# Patient Record
Sex: Female | Born: 1945 | Race: White | Hispanic: No | Smoking: Former smoker
Health system: Southern US, Community
[De-identification: ages and names within clinical notes are randomized; demographics above are authoritative.]

## PROBLEM LIST (undated history)

## (undated) DIAGNOSIS — M810 Age-related osteoporosis without current pathological fracture: Secondary | ICD-10-CM

## (undated) DIAGNOSIS — J45909 Unspecified asthma, uncomplicated: Secondary | ICD-10-CM

## (undated) DIAGNOSIS — M8580 Other specified disorders of bone density and structure, unspecified site: Secondary | ICD-10-CM

## (undated) DIAGNOSIS — H353290 Exudative age-related macular degeneration, unspecified eye, stage unspecified: Secondary | ICD-10-CM

## (undated) DIAGNOSIS — S32000A Wedge compression fracture of unspecified lumbar vertebra, initial encounter for closed fracture: Secondary | ICD-10-CM

## (undated) DIAGNOSIS — G629 Polyneuropathy, unspecified: Secondary | ICD-10-CM

## (undated) DIAGNOSIS — E785 Hyperlipidemia, unspecified: Secondary | ICD-10-CM

## (undated) DIAGNOSIS — K219 Gastro-esophageal reflux disease without esophagitis: Secondary | ICD-10-CM

## (undated) HISTORY — DX: Age-related osteoporosis without current pathological fracture: M81.0

## (undated) HISTORY — DX: Gastro-esophageal reflux disease without esophagitis: K21.9

## (undated) HISTORY — DX: Exudative age-related macular degeneration, unspecified eye, stage unspecified: H35.3290

## (undated) HISTORY — PX: OTHER SURGICAL HISTORY: SHX169

## (undated) HISTORY — DX: Polyneuropathy, unspecified: G62.9

## (undated) HISTORY — DX: Unspecified asthma, uncomplicated: J45.909

## (undated) HISTORY — DX: Wedge compression fracture of unspecified lumbar vertebra, initial encounter for closed fracture: S32.000A

## (undated) HISTORY — DX: Hyperlipidemia, unspecified: E78.5

## (undated) HISTORY — DX: Other specified disorders of bone density and structure, unspecified site: M85.80

---
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
## 2001-03-01 HISTORY — PX: BLADDER SUSPENSION: SHX72

## 2013-03-01 HISTORY — PX: BREAST BIOPSY: SHX20

## 2015-08-12 LAB — HM DEXA SCAN

## 2017-09-29 LAB — CMP 10231: EGFR (Non-African Amer.): 80

## 2018-10-31 HISTORY — PX: KYPHOPLASTY: SHX5884

## 2019-03-08 HISTORY — PX: UPPER GI ENDOSCOPY: SHX6162

## 2019-09-17 LAB — BASIC METABOLIC PANEL
BUN: 23 — AB (ref 4–21)
CO2: 28 — AB (ref 13–22)
Chloride: 108 (ref 99–108)
Creatinine: 0.8 (ref <=1.1)
Glucose: 84
Potassium: 4.3 (ref 3.4–5.3)
Sodium: 140 (ref 137–147)

## 2019-09-17 LAB — CBC: RBC: 4.48 (ref 3.87–5.11)

## 2019-09-17 LAB — LIPID PANEL
Cholesterol: 175 (ref 0–200)
HDL: 51 (ref 35–70)
LDL Cholesterol: 104

## 2019-09-17 LAB — COMPREHENSIVE METABOLIC PANEL
Albumin: 4.1 (ref 3.5–5.0)
Calcium: 8.8 (ref 8.7–10.7)

## 2019-09-17 LAB — CBC AND DIFFERENTIAL
HCT: 40 (ref 36–46)
Hemoglobin: 13.2 (ref 12.0–16.0)
Platelets: 185 (ref 150–399)
WBC: 5.4

## 2019-11-21 LAB — HM MAMMOGRAPHY: HM Mammogram: ABNORMAL — AB (ref 0–4)

## 2019-11-30 ENCOUNTER — Ambulatory Visit: Payer: Medicare Other | Admitting: Family Medicine

## 2019-11-30 LAB — HM HEPATITIS C SCREENING LAB: HM Hepatitis Screen: NEGATIVE

## 2020-01-18 ENCOUNTER — Ambulatory Visit: Payer: Medicare Other | Admitting: Family Medicine

## 2020-01-20 ENCOUNTER — Encounter: Payer: Self-pay | Admitting: Family Medicine

## 2020-01-20 ENCOUNTER — Other Ambulatory Visit: Payer: Self-pay | Admitting: Family Medicine

## 2020-01-20 DIAGNOSIS — J45909 Unspecified asthma, uncomplicated: Secondary | ICD-10-CM | POA: Insufficient documentation

## 2020-01-20 DIAGNOSIS — G9009 Other idiopathic peripheral autonomic neuropathy: Secondary | ICD-10-CM | POA: Insufficient documentation

## 2020-01-20 DIAGNOSIS — K219 Gastro-esophageal reflux disease without esophagitis: Secondary | ICD-10-CM | POA: Insufficient documentation

## 2020-01-20 DIAGNOSIS — J449 Chronic obstructive pulmonary disease, unspecified: Secondary | ICD-10-CM | POA: Insufficient documentation

## 2020-01-20 DIAGNOSIS — M810 Age-related osteoporosis without current pathological fracture: Secondary | ICD-10-CM | POA: Insufficient documentation

## 2020-01-20 DIAGNOSIS — E559 Vitamin D deficiency, unspecified: Secondary | ICD-10-CM | POA: Insufficient documentation

## 2020-01-20 DIAGNOSIS — E785 Hyperlipidemia, unspecified: Secondary | ICD-10-CM | POA: Insufficient documentation

## 2020-01-20 DIAGNOSIS — D509 Iron deficiency anemia, unspecified: Secondary | ICD-10-CM | POA: Insufficient documentation

## 2020-01-20 DIAGNOSIS — H3530 Unspecified macular degeneration: Secondary | ICD-10-CM | POA: Insufficient documentation

## 2020-01-20 DIAGNOSIS — K769 Liver disease, unspecified: Secondary | ICD-10-CM | POA: Insufficient documentation

## 2020-01-20 MED ORDER — ATORVASTATIN CALCIUM 10 MG PO TABS: 10.0000 mg | ORAL_TABLET | Freq: Every day | ORAL | 3 refills | Status: DC

## 2020-01-20 MED ORDER — PANTOPRAZOLE SODIUM 40 MG PO TBEC: 40.0000 mg | DELAYED_RELEASE_TABLET | Freq: Every day | ORAL | 3 refills | Status: DC

## 2020-01-20 MED ORDER — GABAPENTIN 600 MG PO TABS: 600.0000 mg | ORAL_TABLET | Freq: Two times a day (BID) | ORAL | Status: DC

## 2020-01-20 MED ORDER — ALENDRONATE SODIUM 70 MG PO TABS: 70.0000 mg | ORAL_TABLET | ORAL | 11 refills | Status: DC

## 2020-01-23 ENCOUNTER — Encounter: Payer: Self-pay | Admitting: Family Medicine

## 2020-01-23 ENCOUNTER — Ambulatory Visit (INDEPENDENT_AMBULATORY_CARE_PROVIDER_SITE_OTHER): Payer: Medicare Other | Admitting: Family Medicine

## 2020-01-23 ENCOUNTER — Other Ambulatory Visit: Payer: Self-pay

## 2020-01-23 VITALS — BP 122/78 | HR 87 | Temp 97.9°F | Ht 68.0 in | Wt 221.0 lb

## 2020-01-23 DIAGNOSIS — H353230 Exudative age-related macular degeneration, bilateral, stage unspecified: Secondary | ICD-10-CM

## 2020-01-23 DIAGNOSIS — N6320 Unspecified lump in the left breast, unspecified quadrant: Secondary | ICD-10-CM

## 2020-01-23 DIAGNOSIS — J42 Unspecified chronic bronchitis: Secondary | ICD-10-CM | POA: Diagnosis not present

## 2020-01-23 DIAGNOSIS — M545 Low back pain, unspecified: Secondary | ICD-10-CM

## 2020-01-23 DIAGNOSIS — M79605 Pain in left leg: Secondary | ICD-10-CM

## 2020-01-23 DIAGNOSIS — D509 Iron deficiency anemia, unspecified: Secondary | ICD-10-CM

## 2020-01-23 DIAGNOSIS — M810 Age-related osteoporosis without current pathological fracture: Secondary | ICD-10-CM | POA: Diagnosis not present

## 2020-01-23 DIAGNOSIS — E559 Vitamin D deficiency, unspecified: Secondary | ICD-10-CM

## 2020-01-23 DIAGNOSIS — E785 Hyperlipidemia, unspecified: Secondary | ICD-10-CM

## 2020-01-23 DIAGNOSIS — G9009 Other idiopathic peripheral autonomic neuropathy: Secondary | ICD-10-CM

## 2020-01-23 DIAGNOSIS — G8929 Other chronic pain: Secondary | ICD-10-CM

## 2020-01-23 DIAGNOSIS — M79604 Pain in right leg: Secondary | ICD-10-CM

## 2020-01-23 NOTE — Progress Notes (Signed)
Jillian Guerrero DOB: 07-21-1945 Encounter date: 01/23/2020  This isa 74 y.o. female who presents to establish care. Chief Complaint  Patient presents with   Establish Care   Leg Pain    patient complains of bilateral leg pain and numbness, taking Gabapentin for peripheral neuropathy    History of present illness: Came here 12/09/19. Husband passed 2.5 years ago. Moved here to be closer to daughter that lives here. Daughter work from home. 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 She is living in extended stay now.   Has gained weight since husband's death and stress of move - up a lot from baseline.   Had wellness visit and saw PA who worked with PCP and did mammogram; wanted her to do DEXA but study can't be done before 01/25/20. She is on medication so wants to make sure that bone density is improving.   Has neuropathy in bilat lower extremities: states that PA told her related to back. Was put on gabapentin for this 600mg  TID; started that 12-15 years ago. Then during COVID was in bed and right foot turned inward and she couldn't move it. Didn't hurt, just couldn't move it. A couple of weeks later and same thing happened with left leg - just turned inward suddenly; couldn't move leg. Saw PCP and told her to see neurology; but her neurologist moved to . They did increase the gabapentin to BID and the turning in didn't happen again since then. During night sometimes would wake up with severe pain in legs - like someone cutting inside of legs; she was screaming. Tried to get up but passed out due to pain. 4 years ago; they were traveling when this happened; husband ended up callijng 911. Nothing was found (just told she was dehydrated). She is always worried that this is going to happen. If it happens she drinks water, walks up and down. States that it is not a cramp; pain is inside. She sometimes takes hot shower.   Had  kyphoplasty in Jan after fall. Does have pain in lower back. Has this pretty much every day. When severe takes aleve for pain, but tries to avoid taking medications. Sometimes pain does shoot down.   Follows with ophthalmology here q 4 weeks for wet macular degen bilat. Next visit is 12/8.   GERD: well controlled on the protonix 40mg  daily.   She quit smoking about 20+ years ago. Had asthma but has done well since quitting smoking. She is prone to getting bronchitis. Usually requires steroids and antibiotics to help get her over this.    Past Medical History:  Diagnosis Date   GERD (gastroesophageal reflux disease)    Hyperlipidemia    Lumbar compression fracture (HCC)    Osteoporosis    Peripheral neuropathy    Past Surgical History:  Procedure Laterality Date   APPENDECTOMY  1956   BLADDER SUSPENSION  2003   BREAST BIOPSY  2015   hyaluronic acid viscosupplementation Bilateral    knees - 01/16/2018 x2   KYPHOPLASTY  10/2018   TONSILLECTOMY  1964   UPPER GI ENDOSCOPY  03/08/2019   gastritis   Allergies  Allergen Reactions   Collagen     Redness, swelling   Current Meds  Medication Sig   alendronate (FOSAMAX) 70 MG tablet Take 1 tablet (70 mg total) by mouth every 7 (seven) days. Take with a full glass of water on an empty  stomach.   atorvastatin (LIPITOR) 10 MG tablet Take 1 tablet (10 mg total) by mouth daily.   gabapentin (NEURONTIN) 600 MG tablet Take 1 tablet (600 mg total) by mouth 2 (two) times daily.   pantoprazole (PROTONIX) 40 MG tablet Take 1 tablet (40 mg total) by mouth daily.   Social History   Tobacco Use   Smoking status: Former Smoker   Smokeless tobacco: Never Used  Substance Use Topics   Alcohol use: Never   Family History  Problem Relation Age of Onset   Arthritis Mother    Heart disease Mother    Arthritis Father    Depression Father    Arthritis Brother    High blood pressure Brother    High Cholesterol Brother     Arthritis Maternal Grandfather    Asthma Paternal Grandmother    Depression Paternal Grandmother    Heart disease Paternal Grandmother    High Cholesterol Paternal Grandmother    Arthritis Son    Heart attack Son    High Cholesterol Son    Arthritis Daughter      Review of Systems  Constitutional: Negative for chills, fatigue and fever.  Respiratory: Negative for cough, chest tightness, shortness of breath and wheezing.   Cardiovascular: Negative for chest pain, palpitations and leg swelling.  Musculoskeletal: Positive for back pain.  Neurological: Positive for numbness.    Objective:  BP 122/78 (BP Location: Left Arm, Patient Position: Sitting, Cuff Size: Large)    Pulse 87    Temp 97.9 F (36.6 C) (Oral)    Ht 5\' 8"  (1.727 m)    Wt 221 lb (100.2 kg)    BMI 33.60 kg/m   Weight: 221 lb (100.2 kg)   BP Readings from Last 3 Encounters:  01/23/20 122/78   Wt Readings from Last 3 Encounters:  01/23/20 221 lb (100.2 kg)    Physical Exam Constitutional:      General: She is not in acute distress.    Appearance: She is well-developed.  Cardiovascular:     Rate and Rhythm: Normal rate and regular rhythm.     Pulses:          Dorsalis pedis pulses are 2+ on the right side and 2+ on the left side.       Posterior tibial pulses are 2+ on the right side and 2+ on the left side.     Heart sounds: Normal heart sounds. No murmur heard.  No friction rub.     Comments: Warm, well perfused Pulmonary:     Effort: Pulmonary effort is normal. No respiratory distress.     Breath sounds: Normal breath sounds. No wheezing or rales.  Musculoskeletal:     Right lower leg: No edema.     Left lower leg: No edema.  Neurological:     Mental Status: She is alert and oriented to person, place, and time.     Deep Tendon Reflexes:     Reflex Scores:      Patellar reflexes are 2+ on the right side and 2+ on the left side.      Achilles reflexes are 2+ on the right side and 2+ on the  left side. Psychiatric:        Behavior: Behavior normal.     Assessment/Plan:  1. Left breast mass Patient given number for [facility]/location and [place]. She has CDs of prior mammograms as well and asked her to take to med records and give  to ROBIN so that we can proceed with biopsy orders/f/u eval. She will need diagnostic mamm right breast in 6 mo and L breast biopsy now.  2. Pain in both lower extremities I am going to look through prior records and will let her know what follow up indicated. Pain is severe when hits in legs; sounds nerve related. She has had nerve testing in past, and some spinal imaging; just not sure what.  3. Chronic bronchitis, unspecified chronic bronchitis type (HCC) Generally well controlled; does well with prednisone when she has flares.   4. Osteoporosis without current pathological fracture, unspecified osteoporosis type She is due for repeat; currently on fosamax. - DG Bone Density; Future  5. Idiopathic peripheral autonomic neuropathy See above. Gabapentin has helped with nerve pain. 600mg  BID.   6. Hyperlipidemia, unspecified hyperlipidemia type lipitor 10mg  daily; has been well controlled.  7. Iron deficiency anemia, unspecified iron deficiency anemia type Has been stable. Labs being scanned in.  8. Vitamin D deficiency Has been stable. See labs.   9. 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 Have asked her to have them fax note at next visit.  10. Chronic low back pain, unspecified back pain laterality, unspecified whether sciatica present See above.  Return for pending record review.  , MD

## 2020-01-31 ENCOUNTER — Other Ambulatory Visit: Payer: Self-pay | Admitting: *Deleted

## 2020-01-31 ENCOUNTER — Other Ambulatory Visit: Payer: Self-pay | Admitting: Family Medicine

## 2020-01-31 DIAGNOSIS — R5381 Other malaise: Secondary | ICD-10-CM

## 2020-02-04 ENCOUNTER — Telehealth: Payer: Self-pay | Admitting: Family Medicine

## 2020-02-04 DIAGNOSIS — R928 Other abnormal and inconclusive findings on diagnostic imaging of breast: Secondary | ICD-10-CM

## 2020-02-04 DIAGNOSIS — E2839 Other primary ovarian failure: Secondary | ICD-10-CM

## 2020-02-04 DIAGNOSIS — N6320 Unspecified lump in the left breast, unspecified quadrant: Secondary | ICD-10-CM

## 2020-02-04 NOTE — Telephone Encounter (Signed)
Pt is calling in stating that the facility is needing a order to have a biopsy on her L breast and a bone density.

## 2020-02-04 NOTE — Telephone Encounter (Signed)
Pt is calling in stating that she is awaiting return visit to see Dr. Hassan Rowan was told at her last visit that someone will call her to let her know when to schedule her next appointment after provider has looked over her previous medication from [place].  Pt is concerned about running out of her medication and she is not as to which ones she is going to be needing due to her just getting moved in to her new house from the hotel.  Pt stated that she will call back to let us know which ones she will need after unpacking her luggage's.

## 2020-02-04 NOTE — Telephone Encounter (Signed)
I do have a couple of things I was going to review through notes and get in touch; but she can have refill on any of her active meds on list if needed. Ok for 90 day supply.   At last visit we asked her to give those records to [facility]. So if they have them then I am ok with ordering the left breast biopsy now and the diagnostic right breast mammogram in 6 months (please order under dx of breast mass/abnormal mammogram). They needed to review her studies before being able to proceed with procedure.   Ok for dexa order with dx of estrogen deficiency

## 2020-02-06 MED ORDER — ATORVASTATIN CALCIUM 10 MG PO TABS
10.0000 mg | ORAL_TABLET | Freq: Every day | ORAL | 0 refills | Status: DC
Start: 2020-02-06 — End: 2020-04-21

## 2020-02-06 MED ORDER — ALENDRONATE SODIUM 70 MG PO TABS
70.0000 mg | ORAL_TABLET | ORAL | 0 refills | Status: DC
Start: 2020-02-06 — End: 2020-06-30

## 2020-02-06 MED ORDER — PANTOPRAZOLE SODIUM 40 MG PO TBEC
40.0000 mg | DELAYED_RELEASE_TABLET | Freq: Every day | ORAL | 0 refills | Status: DC
Start: 2020-02-06 — End: 2020-04-21

## 2020-02-06 MED ORDER — GABAPENTIN 600 MG PO TABS
600.0000 mg | ORAL_TABLET | Freq: Two times a day (BID) | ORAL | 0 refills | Status: DC
Start: 2020-02-06 — End: 2020-04-21

## 2020-02-06 NOTE — Telephone Encounter (Signed)
Patient informed of the message below.  Patient is aware the Rxs were sent to [facility], orders were entered and she is aware someone will call for appts as below.

## 2020-02-14 ENCOUNTER — Other Ambulatory Visit: Payer: Self-pay | Admitting: Internal Medicine

## 2020-02-14 ENCOUNTER — Other Ambulatory Visit: Payer: Self-pay | Admitting: Family Medicine

## 2020-02-14 DIAGNOSIS — R928 Other abnormal and inconclusive findings on diagnostic imaging of breast: Secondary | ICD-10-CM

## 2020-02-14 DIAGNOSIS — N6320 Unspecified lump in the left breast, unspecified quadrant: Secondary | ICD-10-CM

## 2020-02-25 ENCOUNTER — Ambulatory Visit
Admission: RE | Admit: 2020-02-25 | Discharge: 2020-02-25 | Disposition: A | Discharge status: Home / Self Care | Payer: Medicare Other | Source: Ambulatory Visit | Attending: Family Medicine | Admitting: Family Medicine

## 2020-02-25 ENCOUNTER — Other Ambulatory Visit: Payer: Self-pay

## 2020-02-25 DIAGNOSIS — N6320 Unspecified lump in the left breast, unspecified quadrant: Secondary | ICD-10-CM

## 2020-02-25 DIAGNOSIS — R928 Other abnormal and inconclusive findings on diagnostic imaging of breast: Secondary | ICD-10-CM

## 2020-02-25 IMAGING — MG MM BREAST LOCALIZATION CLIP
4 series · 4 of 12 positions shown · non-contrast
Comparison: Previous exam(s).

CLINICAL DATA: Confirmation of clip placement after stereotactic
tomosynthesis core needle biopsy a focal asymmetry involving the
OUTER LEFT breast at POSTERIOR depth identified on outside imaging.

EXAM:
2D AND TOMOSYNTHESIS DIAGNOSTIC LEFT MAMMOGRAM POST STEREOTACTIC
BIOPSY

[L CC synth-2D]
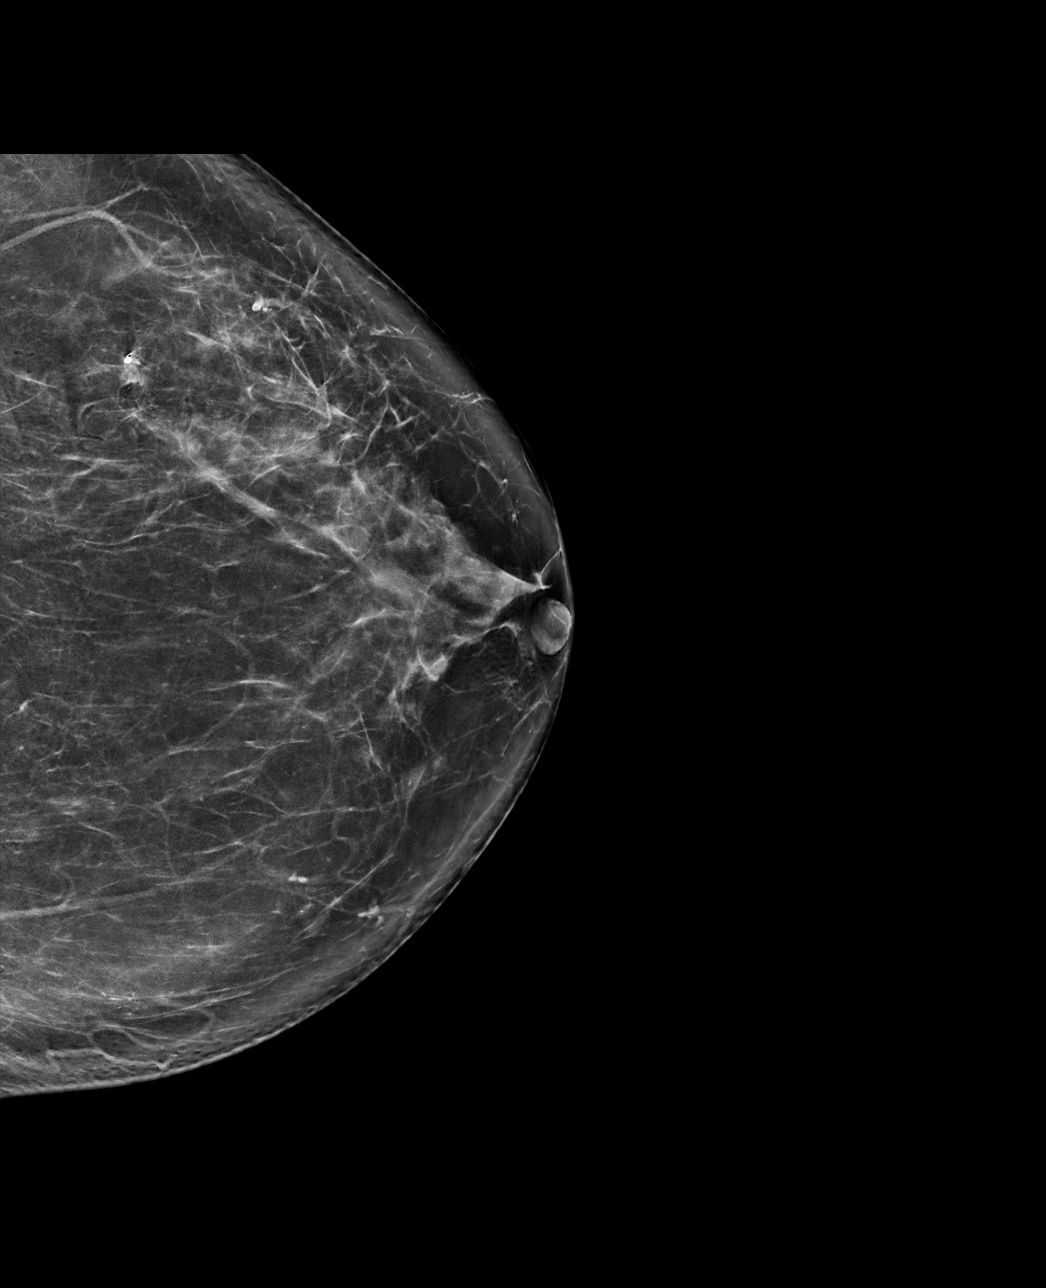

[L ML synth-2D]
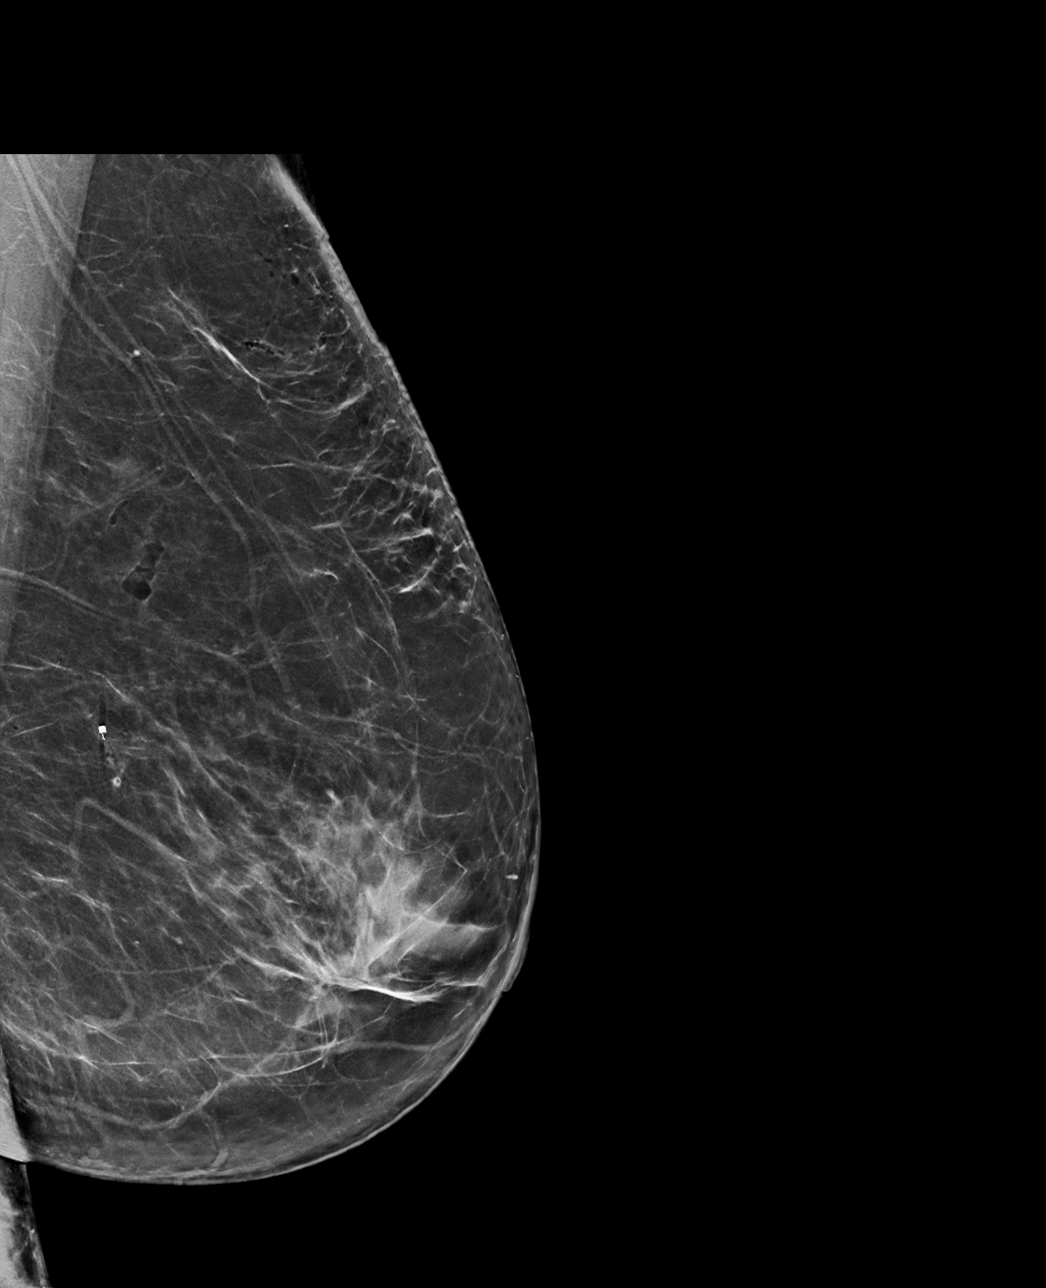

[L CC tomo · tomo slice 42/83.0]
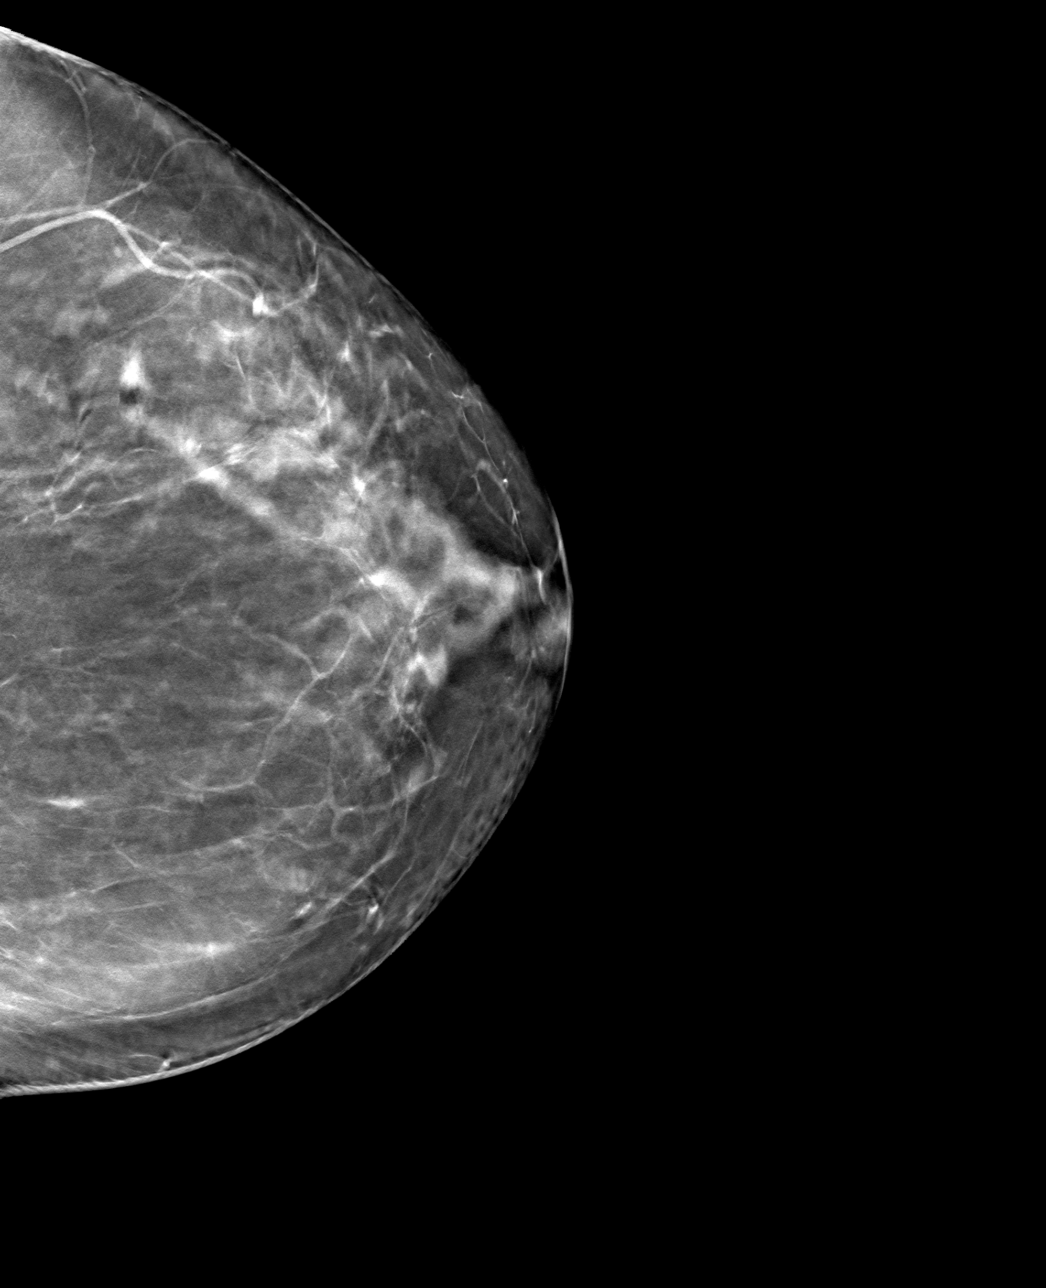

[L ML tomo · tomo slice 41/81.0]
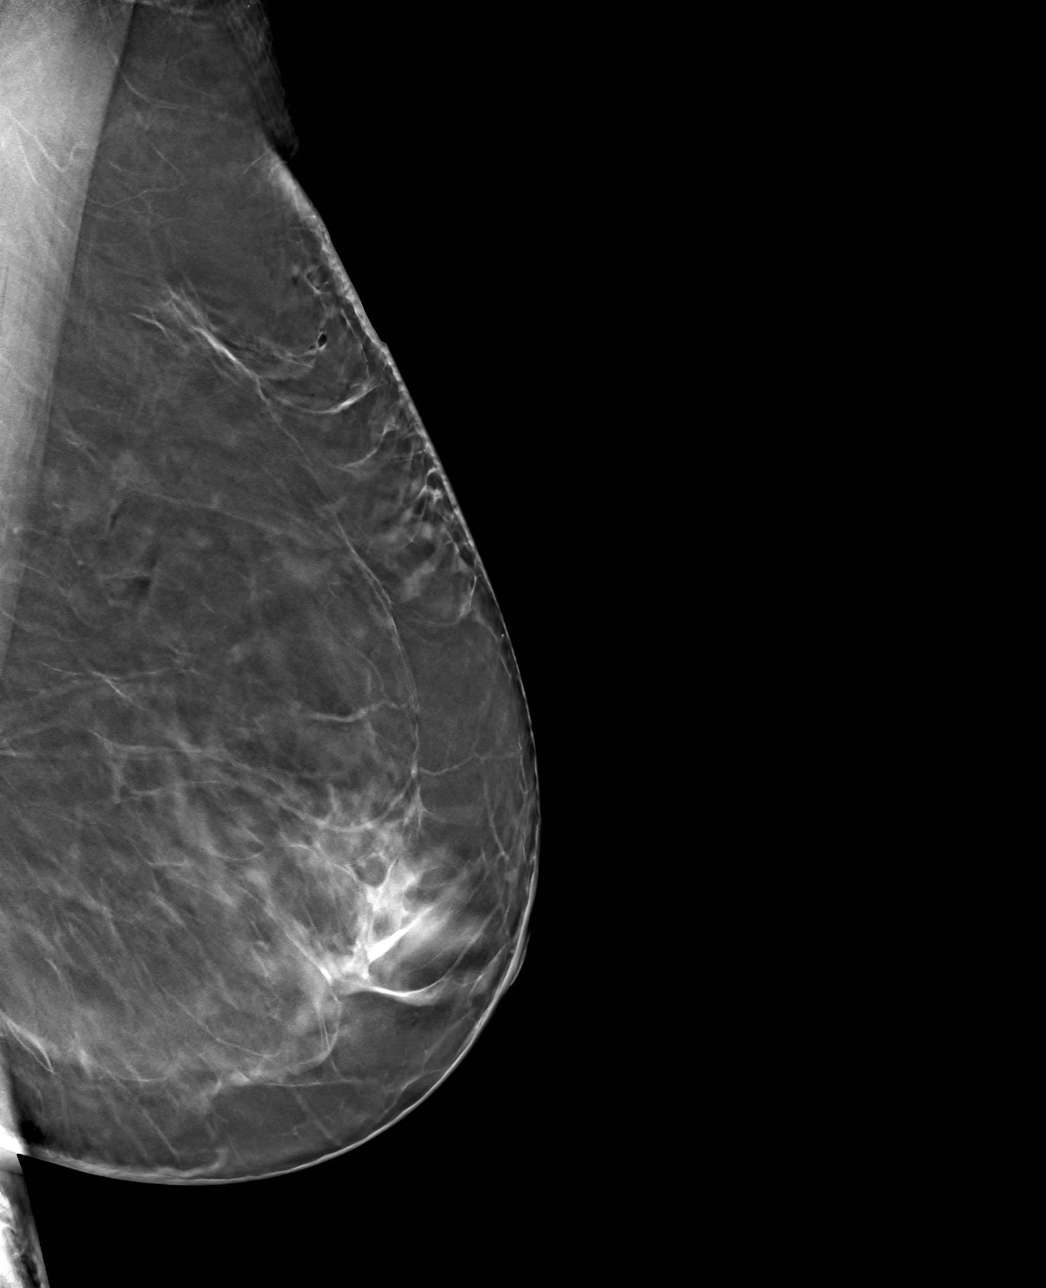

[4 of 12 positions shown; findings below may reference images not displayed]

FINDINGS: Tomosynthesis and synthesized full field CC and mediolateral images
were obtained following stereotactic tomosynthesis guided biopsy of
a focal asymmetry involving the OUTER LEFT breast at POSTERIOR
depth. The coil shaped tissue marking clip is appropriately
positioned at the site of the biopsied asymmetry in the OUTER breast
at POSTERIOR depth, slight UPPER OUTER QUADRANT.

Expected post biopsy changes are present without evidence of
hematoma.
IMPRESSION: Appropriate positioning of the coil shaped tissue marking clip at
the site of the biopsied focal asymmetry in the OUTER LEFT breast at
POSTERIOR depth, slight UPPER OUTER QUADRANT.

Final Assessment: Post Procedure Mammograms for Marker Placement

## 2020-02-25 IMAGING — MG MM BREAST BX W LOC DEV 1ST LESION IMAGE BX SPEC STEREO GUIDE*L*
8 of 9 series · 8 of 17 positions shown · non-contrast
Comparison: Previous exams from [REDACTED] in [facility].
COMPARISON: Previous exams from [REDACTED] in [facility].

Addendum:
CLINICAL DATA: 74-year-old presenting with a screening detected
focal asymmetry involving the OUTER LEFT breast at POSTERIOR depth
(identified on outside imaging).

EXAM:
LEFT BREAST STEREOTACTIC CORE NEEDLE BIOPSY

[L (1 of 6)]
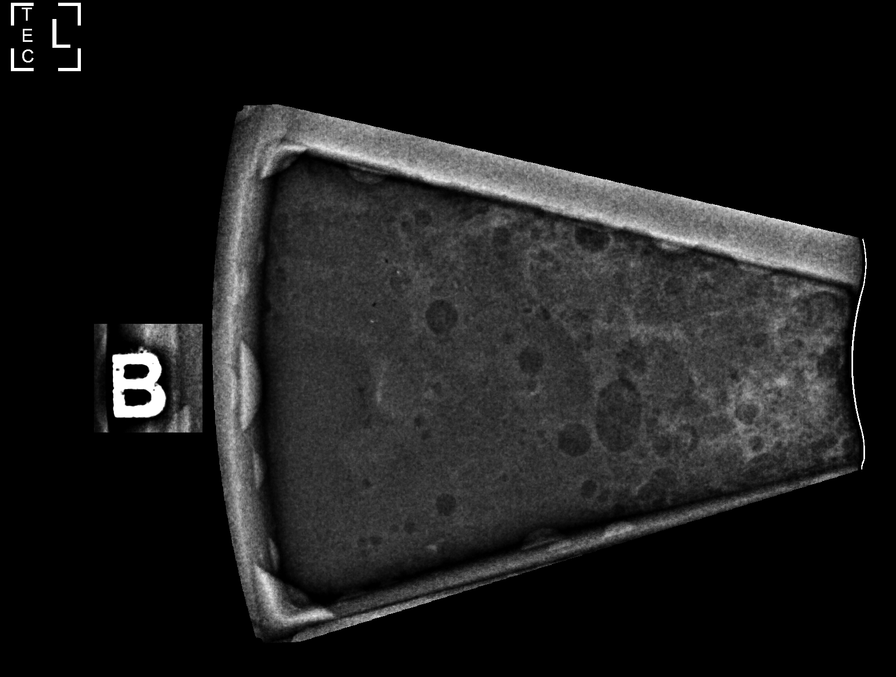

[L (2 of 6)]
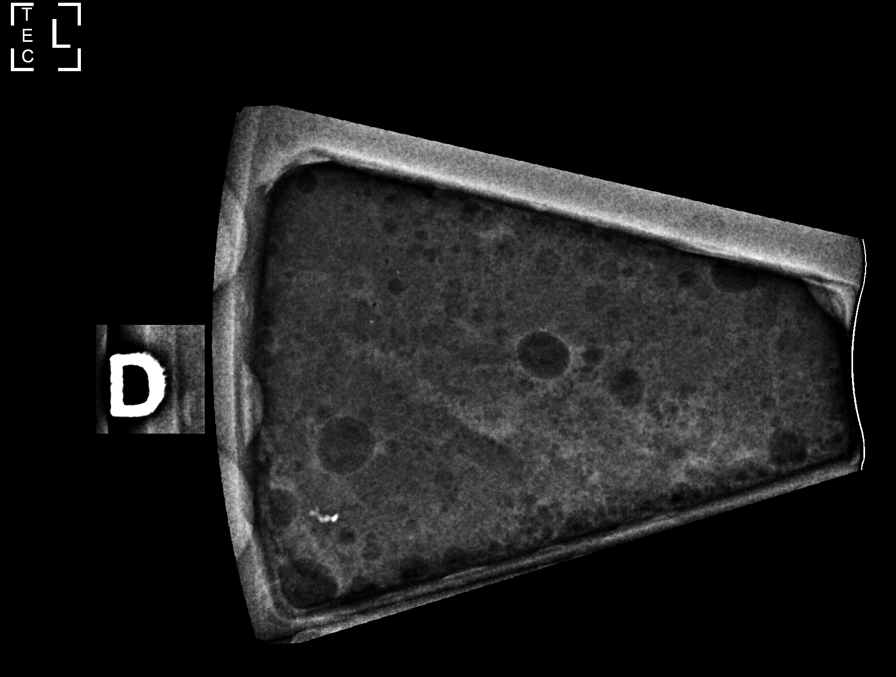

[L (3 of 6)]
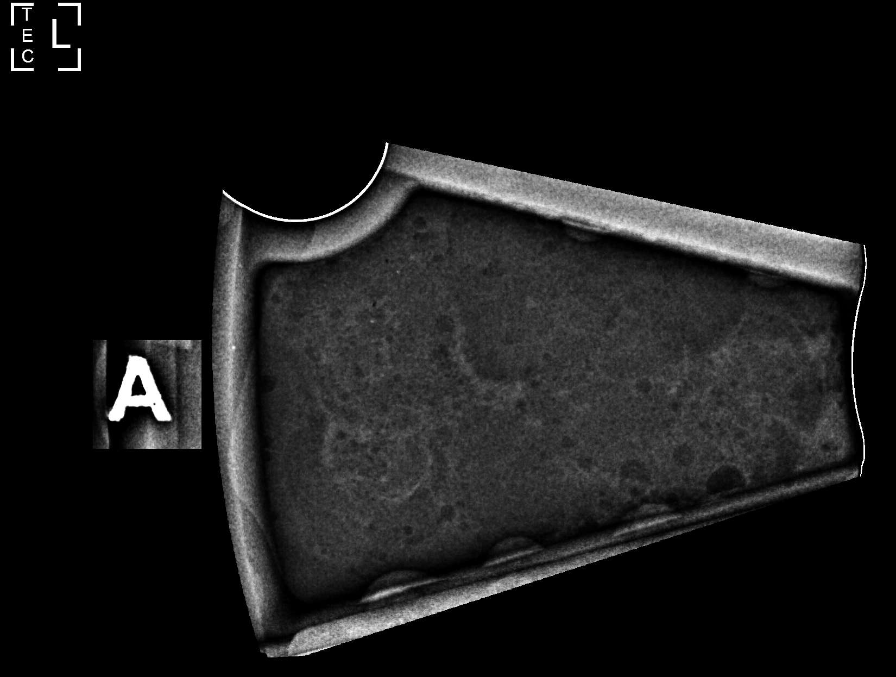

[L (4 of 6)]
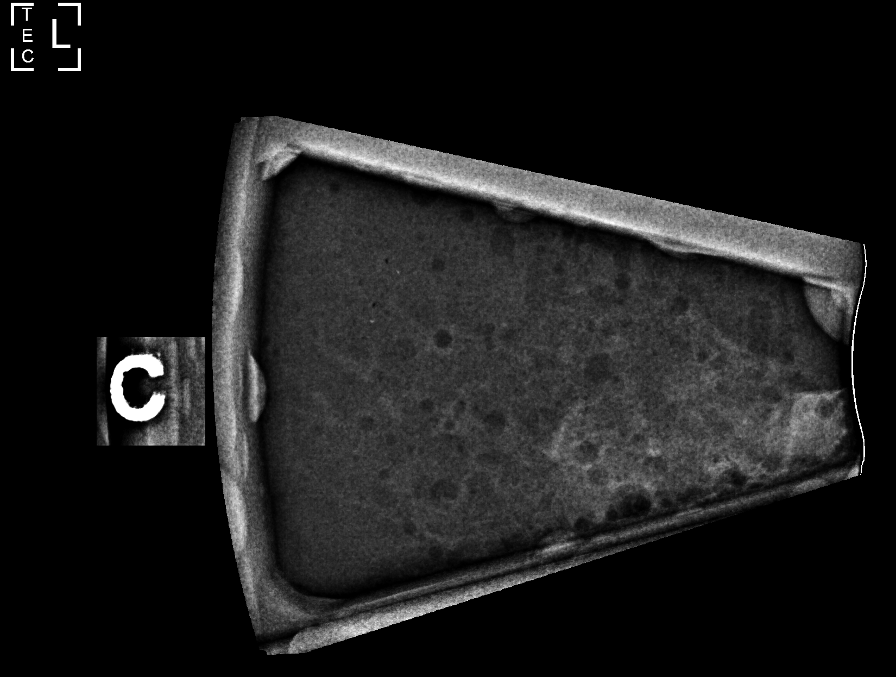

[L (5 of 6)]
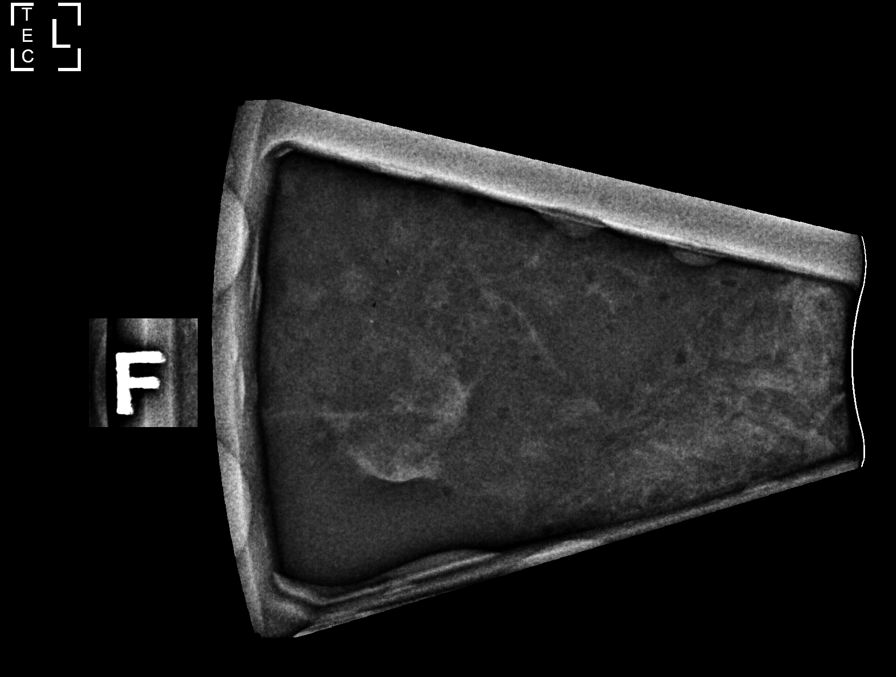

[L (6 of 6)]
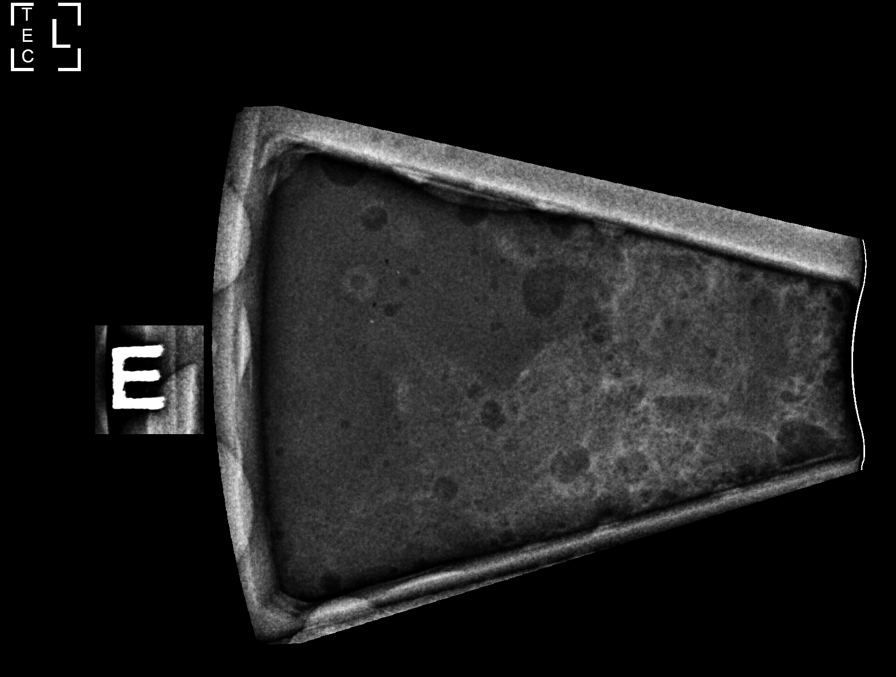

[L CC]
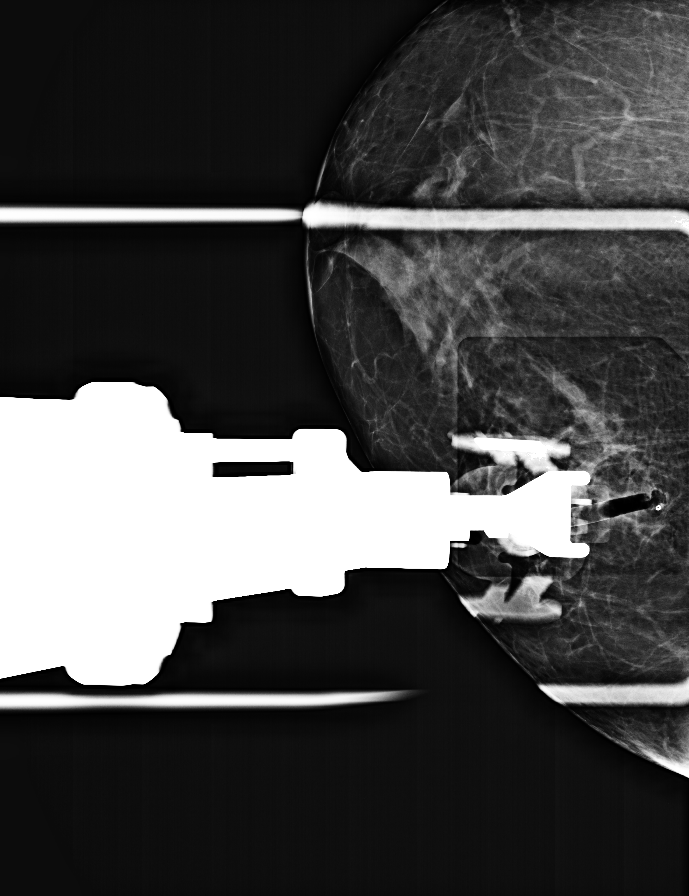

[L CC tomo · tomo slice 39/78.0]
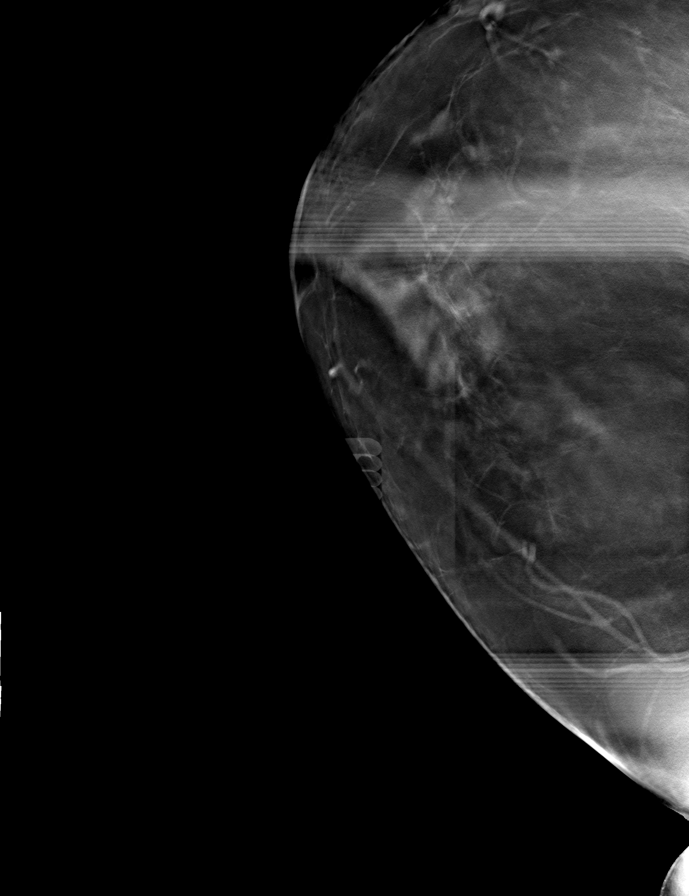

[8 of 17 positions shown; findings below may reference images not displayed]



Lesion quadrant: OUTER breast, slight UPPER OUTER QUADRANT.

Using sterile technique with chlorhexidine as skin antisepsis, 1%
lidocaine and 1% lidocaine with epinephrine as local anesthetic,
under stereotactic tomosynthesis guidance, a 9 gauge Brevera vacuum
assisted device was used to perform core needle biopsy of the focal
asymmetry in the OUTER LEFT breast using a superior approach.
Specimen radiograph was performed showing soft tissue density in
multiple core samples.

At the conclusion of the procedure, a coil shaped tissue marker clip
was deployed into the biopsy cavity. Follow-up 2-view mammogram was
performed and dictated separately.
IMPRESSION: Stereotactic-guided biopsy of a focal asymmetry involving the OUTER
LEFT breast at POSTERIOR depth, slight UPPER OUTER QUADRANT. No
apparent complications.

ADDENDUM:
Pathology revealed FIBROCYSTIC CHANGE WITH ADENOSIS of the LEFT
breast, outer at posterior depth. This was found to be concordant by
Dr. [facility].

Pathology results were discussed with the patient by telephone. The
patient reported doing well after the biopsy with minimal to no
tenderness at the site. Post biopsy instructions and care were
reviewed and questions were answered. The patient was encouraged to
call The [REDACTED] for any additional
concerns.

The patient was asked to return for RIGHT diagnostic mammography and
ultrasound in [REDACTED], early [DATE], per previous diagnostic
report from [REDACTED] in [REDACTED]. She was informed
a reminder notice would be sent regarding this appointment.

Pathology results reported by JOSIM UDDIN, RN on [DATE].



Lesion quadrant: OUTER breast, slight UPPER OUTER QUADRANT.

Using sterile technique with chlorhexidine as skin antisepsis, 1%
lidocaine and 1% lidocaine with epinephrine as local anesthetic,
under stereotactic tomosynthesis guidance, a 9 gauge Brevera vacuum
assisted device was used to perform core needle biopsy of the focal
asymmetry in the OUTER LEFT breast using a superior approach.
Specimen radiograph was performed showing soft tissue density in
multiple core samples.

At the conclusion of the procedure, a coil shaped tissue marker clip
was deployed into the biopsy cavity. Follow-up 2-view mammogram was
performed and dictated separately.
IMPRESSION: Stereotactic-guided biopsy of a focal asymmetry involving the OUTER
LEFT breast at POSTERIOR depth, slight UPPER OUTER QUADRANT. No
apparent complications.

## 2020-03-14 ENCOUNTER — Encounter: Payer: Self-pay | Admitting: Family Medicine

## 2020-03-14 ENCOUNTER — Telehealth (INDEPENDENT_AMBULATORY_CARE_PROVIDER_SITE_OTHER): Payer: Medicare Other | Admitting: Family Medicine

## 2020-03-14 VITALS — Wt 210.0 lb

## 2020-03-14 DIAGNOSIS — R29818 Other symptoms and signs involving the nervous system: Secondary | ICD-10-CM | POA: Diagnosis not present

## 2020-03-14 DIAGNOSIS — J4521 Mild intermittent asthma with (acute) exacerbation: Secondary | ICD-10-CM | POA: Diagnosis not present

## 2020-03-14 NOTE — Progress Notes (Signed)
Virtual Visit via Video Note  I connected with Jillian Guerrero on 03/14/20 at  4:00 PM EST by a video enabled telemedicine application and verified that I am speaking with the correct person using two identifiers.  Location patient: home Location provider: [facility]  [address] [place], [place] [address] Persons participating in the virtual visit: patient, provider  I discussed the limitations of evaluation and management by telemedicine and the availability of in person appointments. The patient expressed understanding and agreed to proceed.  Video was not working; so visit completed by telephone  Jillian Guerrero DOB: [birth year] Encounter date: 03/14/2020  This is a 75 y.o. female who presents with Chief Complaint  Patient presents with  . Cough    Patient complains of a non-productive cough and wheezing x1 month, tried OTC medication with no relief    History of present illness: Has been coughing now for a month. Not coughing now. States that she is prone to getting sick/sick exposure. States that she usually needs prednisone/abx to help her. Continues to wheeze after infections due to hx of asthma. Doesn't want to go to urgent care due to all the sick people out there.   She went to pharmacy and was given cough medication on xmas eve. Got flu shot that day as well. Wasn't doing great, but then new years eve, she wanted to go to urgent care but had hard time being seen. She was wheezing; hard to sleep.   Daughter was at her house and was worried. Called in middle of night and found [facility] urgent care virtual visit. Doc called and spoke with her and could hear wheezing on phone - prescribed: 21 pills of methylprednisone 4mg . Not wheezing much after finishing this. Also given inhaler to take 2 puffs q 6 hours. Also given doxycycline. And tessalon perles. She states that she is 100% better than last Saturday. She postponed cataract surgery (see below). Didn't have COVID test  during last month.   Last weds she had appointment with eye doc - has to get cataracts removed. Surgery scheduled for past Monday; but PA who was examining her said she could hear wheezing while examining her.   She has been taking gabapentin for peripheral neuropathy. 6 mo ago was in bed and left foot turned inward. Then couple of weeks later was sitting in chair and foot turned inward. No pain, nothing; just couldn't move it for 20-30 seconds. Happened a few times. Recommended increase in gabapentin and didn't happen again. What is happening now is: was walking and felt that foot was sticking to ground. Left foot/leg heavy like something stuck to shoe. Comes and goes; normal for a few days and then can recur.   Allergies  Allergen Reactions  . Collagen     Redness, swelling   Current Meds  Medication Sig  . alendronate (FOSAMAX) 70 MG tablet Take 1 tablet (70 mg total) by mouth every 7 (seven) days. Take with a full glass of water on an empty stomach.  Wednesday atorvastatin (LIPITOR) 10 MG tablet Take 1 tablet (10 mg total) by mouth daily.  Marland Kitchen gabapentin (NEURONTIN) 600 MG tablet Take 1 tablet (600 mg total) by mouth 2 (two) times daily.  . pantoprazole (PROTONIX) 40 MG tablet Take 1 tablet (40 mg total) by mouth daily.    Review of Systems  Constitutional: Negative for chills, fatigue and fever.  Respiratory: Negative for cough, chest tightness, shortness of breath and wheezing.   Cardiovascular:  Negative for chest pain, palpitations and leg swelling.  Neurological: Positive for weakness (see hpi). Negative for dizziness and headaches.    Objective:  Wt 210 lb (95.3 kg)   BMI 31.93 kg/m   Weight: 210 lb (95.3 kg)   BP Readings from Last 3 Encounters:  01/23/20 122/78   Wt Readings from Last 3 Encounters:  03/14/20 210 lb (95.3 kg)  01/23/20 221 lb (100.2 kg)    EXAM:  GENERAL: sounds alert, oriented, and in no acute distress  LUNGS: no noted difficulty with breathing during  conversation.   PSYCH/NEURO: pleasant and cooperative, no obvious depression or anxiety, speech and thought processing grossly intact   Assessment/Plan  1. Mild intermittent asthma with acute exacerbation She is doing much better since treatment from urgent care.  She would just like reassurance that somebody doing a lung exam on her.  I think with symptoms going on as long as they had, that this is reasonable.  I am going to make room in my schedule to see her on Wednesday.  2. Neurological deficit, transient I am concerned with her current episodes of abnormalities within her foot.  She completely reverts back to normal after episodes, but has had multiple episodes in the same leg.  I am going to order an MRI of the brain to start, we will further evaluate this week at her visit to see what other work-up may be needed.    I discussed the assessment and treatment plan with the patient. The patient was provided an opportunity to ask questions and all were answered. The patient agreed with the plan and demonstrated an understanding of the instructions.   The patient was advised to call back or seek an in-person evaluation if the symptoms worsen or if the condition fails to improve as anticipated.  I provided 25 minutes of non-face-to-face time during this encounter.   Theodis Shove, MD

## 2020-03-19 ENCOUNTER — Telehealth: Payer: Self-pay | Admitting: Family Medicine

## 2020-03-19 NOTE — Telephone Encounter (Signed)
Patient insists that she was suppose to have an appointment today with Dr. Fabian Sharp.  She states she was suppose to have an MRI done but she has not heard from anyone.  Please advise.

## 2020-03-20 ENCOUNTER — Telehealth: Payer: Self-pay | Admitting: *Deleted

## 2020-03-20 NOTE — Telephone Encounter (Signed)
-----   Message from Wynn Banker, MD sent at 03/14/2020  6:15 PM EST ----- Please add patient on to my schedule next weds at 12:00. She is aware of this appointment being scheduled.

## 2020-03-20 NOTE — Telephone Encounter (Signed)
Due to provider being out of the office, patients appt was already rescheduled for 1/24.

## 2020-03-20 NOTE — Telephone Encounter (Signed)
Appt was rescheduled for 1/24.  Spoke with the pt and informed her someone will call with appt info for the MRI as the order was recently placed and has to be approved through the insurance.

## 2020-03-24 ENCOUNTER — Ambulatory Visit (INDEPENDENT_AMBULATORY_CARE_PROVIDER_SITE_OTHER): Payer: Medicare Other

## 2020-03-24 ENCOUNTER — Ambulatory Visit (INDEPENDENT_AMBULATORY_CARE_PROVIDER_SITE_OTHER): Payer: Medicare Other | Admitting: Family Medicine

## 2020-03-24 ENCOUNTER — Other Ambulatory Visit: Payer: Self-pay

## 2020-03-24 ENCOUNTER — Encounter: Payer: Self-pay | Admitting: Family Medicine

## 2020-03-24 VITALS — BP 118/72 | HR 75 | Temp 96.8°F | Ht 68.0 in | Wt 215.0 lb

## 2020-03-24 DIAGNOSIS — E559 Vitamin D deficiency, unspecified: Secondary | ICD-10-CM | POA: Diagnosis not present

## 2020-03-24 DIAGNOSIS — R059 Cough, unspecified: Secondary | ICD-10-CM

## 2020-03-24 DIAGNOSIS — R29818 Other symptoms and signs involving the nervous system: Secondary | ICD-10-CM

## 2020-03-24 DIAGNOSIS — E538 Deficiency of other specified B group vitamins: Secondary | ICD-10-CM | POA: Diagnosis not present

## 2020-03-24 DIAGNOSIS — E785 Hyperlipidemia, unspecified: Secondary | ICD-10-CM | POA: Diagnosis not present

## 2020-03-24 LAB — LIPID PANEL
Cholesterol: 162 mg/dL (ref 0–200)
HDL: 51.6 mg/dL (ref >39.00)
LDL Cholesterol: 84 mg/dL (ref 0–99)
NonHDL: 110.04
Total CHOL/HDL Ratio: 3
Triglycerides: 131 mg/dL (ref 0.0–149.0)
VLDL: 26.2 mg/dL (ref 0.0–40.0)

## 2020-03-24 LAB — COMPREHENSIVE METABOLIC PANEL
ALT: 12 U/L (ref 0–35)
AST: 13 U/L (ref 0–37)
Albumin: 4.3 g/dL (ref 3.5–5.2)
Alkaline Phosphatase: 37 U/L — ABNORMAL LOW (ref 39–117)
BUN: 12 mg/dL (ref 6–23)
CO2: 31 mEq/L (ref 19–32)
Calcium: 9.4 mg/dL (ref 8.4–10.5)
Chloride: 105 mEq/L (ref 96–112)
Creatinine, Ser: 0.71 mg/dL (ref 0.40–1.20)
GFR: 83.24 mL/min (ref >60.00)
Glucose, Bld: 95 mg/dL (ref 70–99)
Potassium: 4.4 mEq/L (ref 3.5–5.1)
Sodium: 142 mEq/L (ref 135–145)
Total Bilirubin: 0.7 mg/dL (ref 0.2–1.2)
Total Protein: 6.3 g/dL (ref 6.0–8.3)

## 2020-03-24 LAB — TSH: TSH: 1.92 u[IU]/mL (ref 0.35–4.50)

## 2020-03-24 LAB — CBC WITH DIFFERENTIAL/PLATELET
Basophils Absolute: 0 10*3/uL (ref 0.0–0.1)
Basophils Relative: 0.9 % (ref 0.0–3.0)
Eosinophils Absolute: 0.3 10*3/uL (ref 0.0–0.7)
Eosinophils Relative: 5.8 % — ABNORMAL HIGH (ref 0.0–5.0)
HCT: 43.2 % (ref 36.0–46.0)
Hemoglobin: 14.5 g/dL (ref 12.0–15.0)
Lymphocytes Relative: 27.2 % (ref 12.0–46.0)
Lymphs Abs: 1.4 10*3/uL (ref 0.7–4.0)
MCHC: 33.7 g/dL (ref 30.0–36.0)
MCV: 87.3 fl (ref 78.0–100.0)
Monocytes Absolute: 0.4 10*3/uL (ref 0.1–1.0)
Monocytes Relative: 7.9 % (ref 3.0–12.0)
Neutro Abs: 3 10*3/uL (ref 1.4–7.7)
Neutrophils Relative %: 58.2 % (ref 43.0–77.0)
Platelets: 176 10*3/uL (ref 150.0–400.0)
RBC: 4.94 Mil/uL (ref 3.87–5.11)
RDW: 13.9 % (ref 11.5–15.5)
WBC: 5.1 10*3/uL (ref 4.0–10.5)

## 2020-03-24 LAB — VITAMIN D 25 HYDROXY (VIT D DEFICIENCY, FRACTURES): VITD: 22.89 ng/mL — ABNORMAL LOW (ref 30.00–100.00)

## 2020-03-24 LAB — VITAMIN B12: Vitamin B-12: 380 pg/mL (ref 211–911)

## 2020-03-24 IMAGING — DX DG CHEST 2V
2 series · 2 of 2 positions shown · non-contrast
Comparison: None.

CLINICAL DATA: Cough, shortness of breath

EXAM:
CHEST - 2 VIEW

[chest pa]
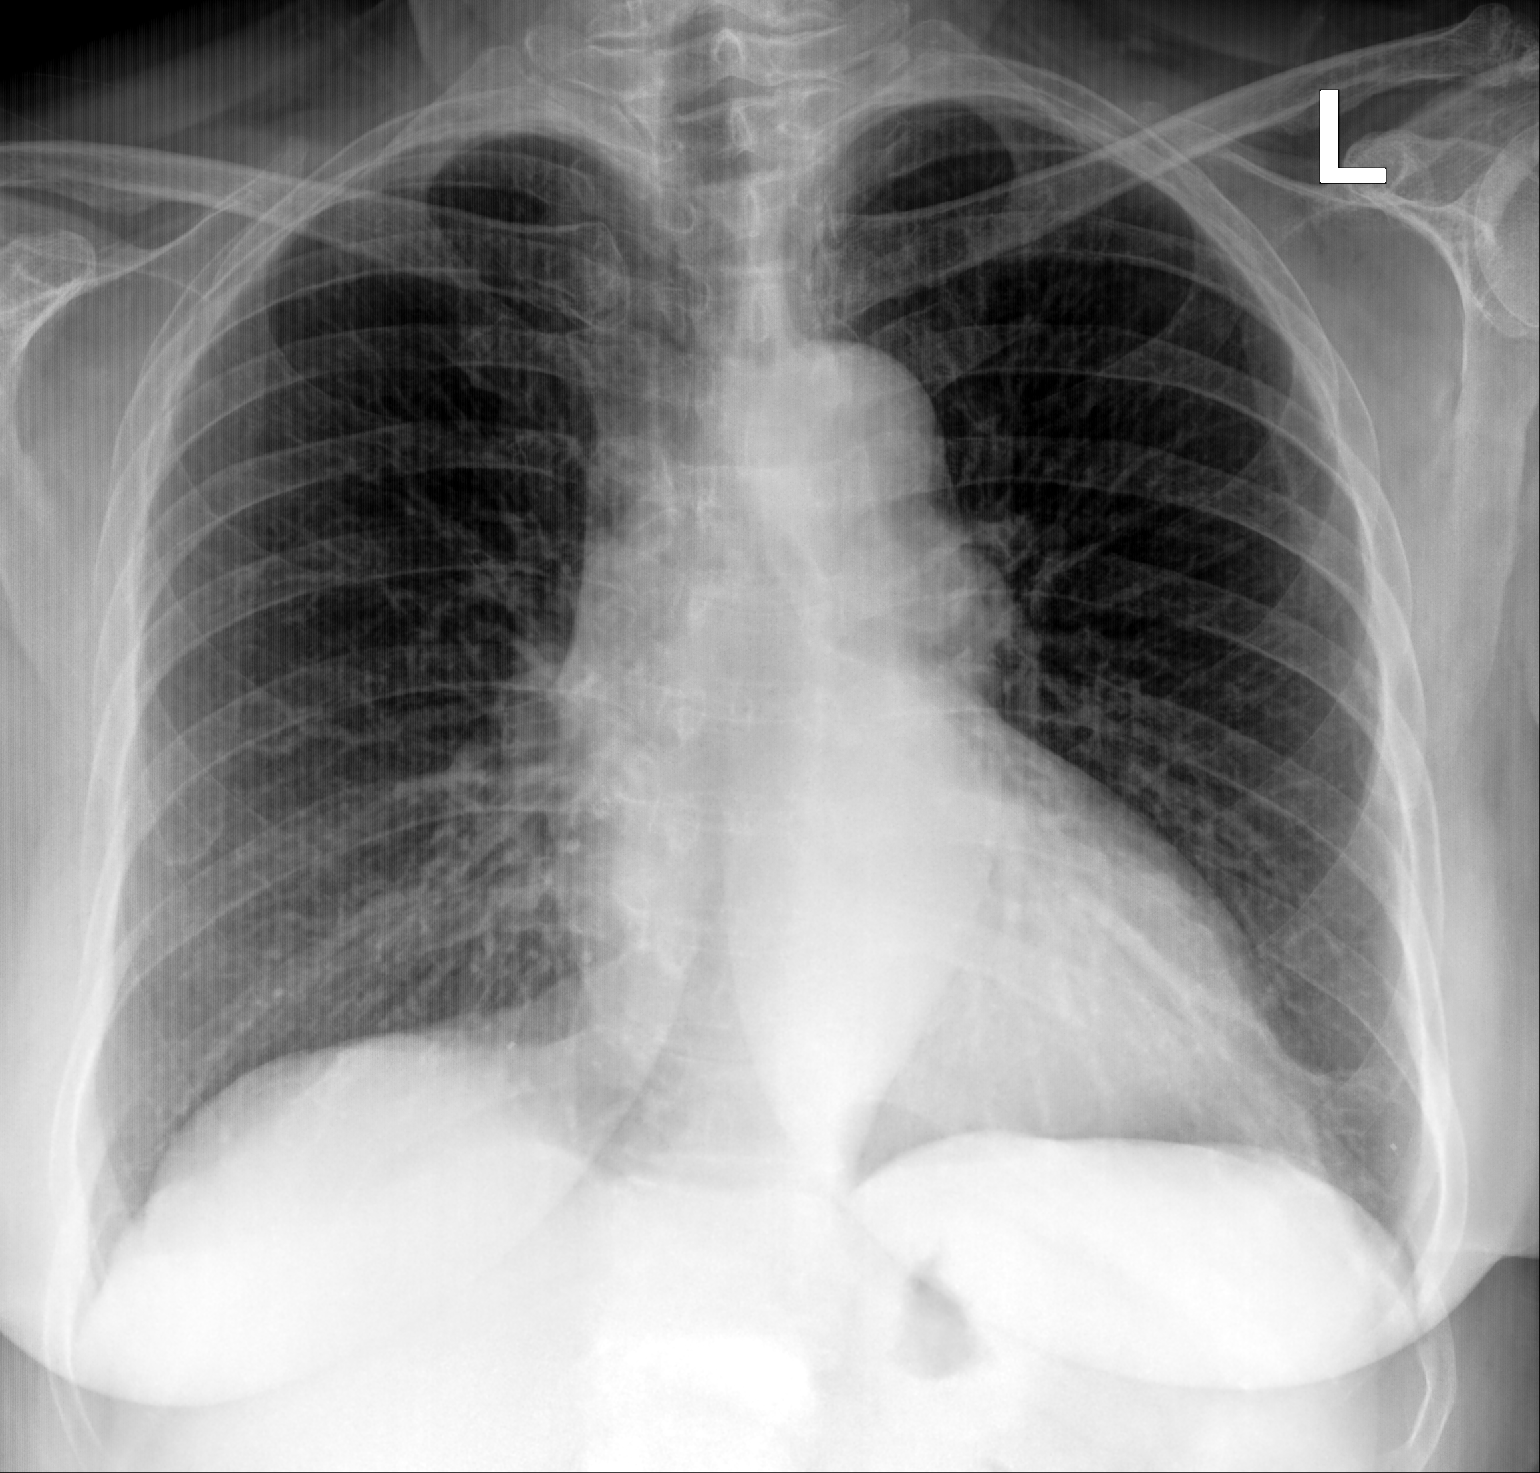

[chest lat]
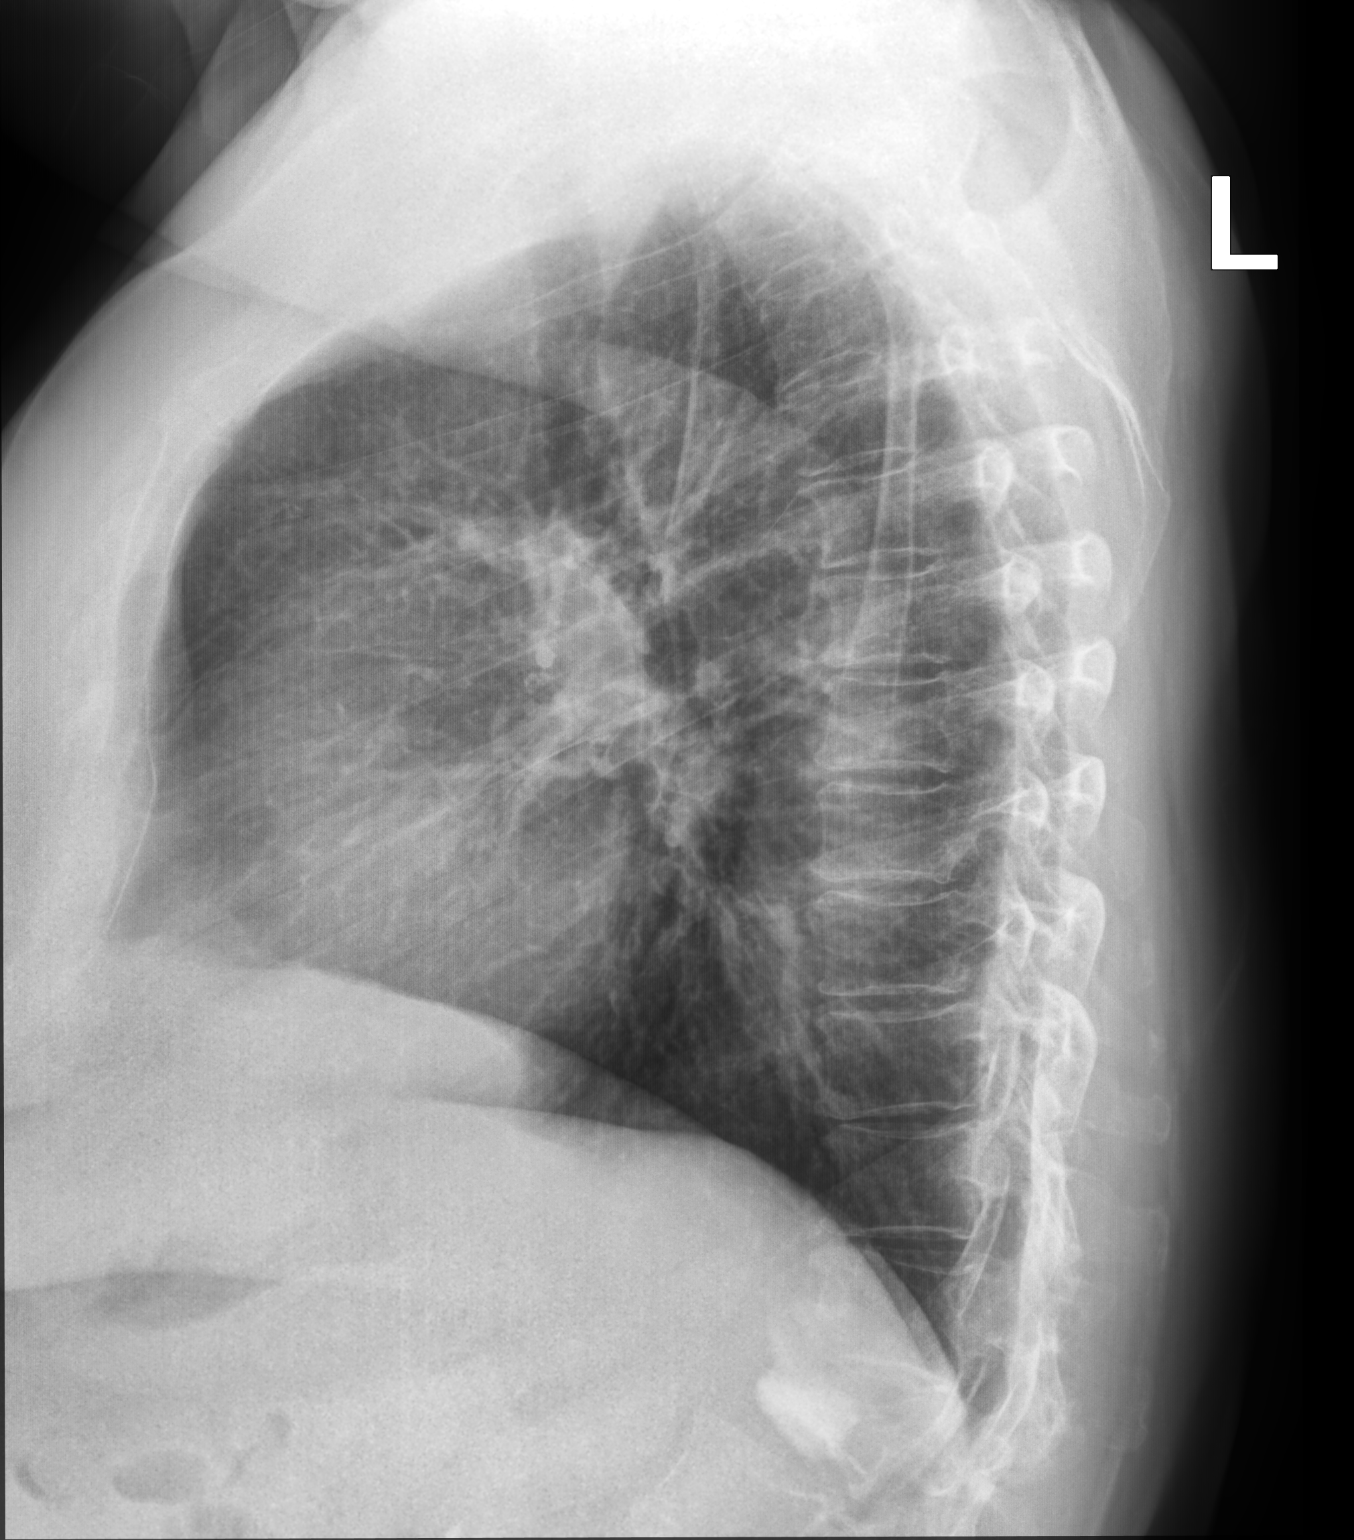

[2 of 2 positions shown; findings below may reference images not displayed]

FINDINGS: Heart and mediastinal contours are within normal limits. No focal
opacities or effusions. No acute bony abnormality.
IMPRESSION: No active cardiopulmonary disease.

## 2020-03-24 MED ORDER — FLUTICASONE-SALMETEROL 250-50 MCG/DOSE IN AEPB: 1.0000 | INHALATION_SPRAY | Freq: Two times a day (BID) | RESPIRATORY_TRACT | 2 refills | Status: DC

## 2020-03-24 MED ORDER — SPACER/AERO-HOLDING CHAMBERS DEVI: 1.0000 | Freq: Every day | 0 refills | Status: DC | PRN

## 2020-03-24 MED ORDER — ALBUTEROL SULFATE HFA 108 (90 BASE) MCG/ACT IN AERS: 2.0000 | INHALATION_SPRAY | Freq: Four times a day (QID) | RESPIRATORY_TRACT | 0 refills | Status: DC | PRN

## 2020-03-24 NOTE — Progress Notes (Signed)
Jillian Guerrero DOB: 12-20-1945 Encounter date: 03/24/2020  This is a 75 y.o. female who presents with Chief Complaint  Patient presents with  . Cough    Patient states the cough has resolved    History of present illness: We spoke by phone 2 weeks ago; she had been dealing with respiratory symptoms that were treated at urgent care.  She was feeling much better, but wanted lungs to be listened to to make sure everything sounded okay.  Additionally, she was having intermittent episodes of "leg not working".   Mess of phone calls with appointment today.   She started wheezing again - not all the time, but when she is walking up steps she can feel it. She is very worried that she is going to get sick again. She feels that she gets sick easily. She started coughing just a little bit, but not as bad as it was previously. Just more hacking cough. If she lays in bed she would start coughing/ but this is better now. Cough is not productive. No pain in chest, but has heaviness, especially with walking.   About a year ago; turning inward of left leg. Couldn't move it. No pain. Then 2 months later was sitting and same thing; left leg turned in, she couldn't move it; no pain. Just seconds. That was in August - saw primary physician and wanted her to see neurologist but appointment was too far away. Now then had two episodes within about 2 weeks of each other about 1-2 months ago where she had suddenly left leg felt stuck to ground to walk. Started having headaches lately; not as bad as she did in past. No other weakness, numbness, tingling in arms/legs. She is taking gabapentin 2 daily now; not sure if related to helping with turning inward. She does have back pain; lower back. Feels like cold water all the time. 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   Allergies  Allergen Reactions  . Collagen     Redness, swelling   Current Meds  Medication Sig  . alendronate (FOSAMAX) 70 MG  tablet Take 1 tablet (70 mg total) by mouth every 7 (seven) days. Take with a full glass of water on an empty stomach.  Marland Kitchen atorvastatin (LIPITOR) 10 MG tablet Take 1 tablet (10 mg total) by mouth daily.  Marland Kitchen CALCIUM PO Take by mouth daily.  . cetirizine (ZYRTEC) 10 MG tablet Take 10 mg by mouth daily.  Marland Kitchen gabapentin (NEURONTIN) 600 MG tablet Take 1 tablet (600 mg total) by mouth 2 (two) times daily.  . pantoprazole (PROTONIX) 40 MG tablet Take 1 tablet (40 mg total) by mouth daily.    Review of Systems  Constitutional: Negative for chills, fatigue and fever.  Respiratory: Positive for cough, shortness of breath and wheezing. Negative for chest tightness.   Cardiovascular: Negative for chest pain, palpitations and leg swelling.  Neurological: Positive for weakness (see hpi).    Objective:  BP 118/72 (BP Location: Left Arm, Patient Position: Sitting, Cuff Size: Large)   Pulse 75   Temp (!) 96.8 F (36 C) (Axillary)   Ht _0  (1.727 m)   Wt 215 lb (97.5 kg)   SpO2 95%   BMI 32.69 kg/m   Weight: 215 lb (97.5 kg)   BP Readings from Last 3 Encounters:  03/24/20 118/72  01/23/20 122/78   Wt Readings from Last 3 Encounters:  03/24/20 215 lb (97.5 kg)  03/14/20 210 lb (95.3 kg)  01/23/20  221 lb (100.2 kg)    Physical Exam Constitutional:      General: She is not in acute distress.    Appearance: She is well-developed and well-nourished. She is not diaphoretic.  HENT:     Head: Normocephalic and atraumatic.     Right Ear: External ear normal.     Left Ear: External ear normal.     Mouth/Throat:     Mouth: Oropharynx is clear and moist.  Eyes:     Conjunctiva/sclera: Conjunctivae normal.     Pupils: Pupils are equal, round, and reactive to light.  Neck:     Thyroid: No thyromegaly.  Cardiovascular:     Rate and Rhythm: Normal rate and regular rhythm.     Heart sounds: Normal heart sounds. No murmur heard. No friction rub. No gallop.      Comments: No cardiac bruit  appreciated Pulmonary:     Effort: Pulmonary effort is normal. No respiratory distress.     Breath sounds: Decreased air movement present. Decreased breath sounds and wheezing (end exp) present. No rales.  Musculoskeletal:     Cervical back: Neck supple.     Right lower leg: No edema.     Left lower leg: No edema.  Lymphadenopathy:     Cervical: No cervical adenopathy.  Skin:    General: Skin is warm and dry.  Neurological:     Mental Status: She is alert and oriented to person, place, and time.     Cranial Nerves: No cranial nerve deficit.     Motor: No abnormal muscle tone.     Deep Tendon Reflexes: Strength normal. Reflexes normal.     Reflex Scores:      Tricep reflexes are 2+ on the right side and 2+ on the left side.      Bicep reflexes are 2+ on the right side and 2+ on the left side.      Brachioradialis reflexes are 2+ on the right side and 2+ on the left side.      Patellar reflexes are 2+ on the right side and 2+ on the left side. Psychiatric:        Mood and Affect: Mood and affect normal.        Behavior: Behavior normal.     Assessment/Plan  1. Cough Will get cxr for follow up since still having some cough. Suspect post infectious/reactive airway and should improve with advair inhaler which was sent in today. She has used this in remote past and did well with this. - DG Chest 2 View; Future  2. Neurological deficit, transient She would like to see neurology which I think is reasonable given symptoms of leg weakness. We will order imaging in meanwhile of brain to make sure no abnormalities noted. Neuro exam for me today is normal. - Ambulatory referral to Neurology - CBC with Differential/Platelet; Future - CBC with Differential/Platelet  3. Vitamin B12 deficiency - Vitamin B12; Future - Vitamin B12  4. Vitamin D deficiency - VITAMIN D 25 Hydroxy (Vit-D Deficiency, Fractures); Future - VITAMIN D 25 Hydroxy (Vit-D Deficiency, Fractures)  5. Hyperlipidemia,  unspecified hyperlipidemia type - TSH; Future - Comprehensive metabolic panel; Future - Lipid panel; Future - TSH - Comprehensive metabolic panel - Lipid panel   Return for pending MRI results.    Micheline Rough, MD

## 2020-04-01 ENCOUNTER — Encounter: Payer: Self-pay | Admitting: Neurology

## 2020-04-14 ENCOUNTER — Ambulatory Visit
Admission: RE | Admit: 2020-04-14 | Discharge: 2020-04-14 | Disposition: A | Discharge status: Home / Self Care | Payer: Medicare Other | Source: Ambulatory Visit | Attending: Family Medicine | Admitting: Family Medicine

## 2020-04-14 ENCOUNTER — Other Ambulatory Visit: Payer: Self-pay

## 2020-04-14 DIAGNOSIS — R29818 Other symptoms and signs involving the nervous system: Secondary | ICD-10-CM

## 2020-04-14 IMAGING — MR MR HEAD W/O CM
11 series · 48 of 48 positions shown · non-contrast
Comparison: None.

CLINICAL DATA: Intermittent loss of motor function.

EXAM:
MRI HEAD WITHOUT CONTRAST
TECHNIQUE: Multiplanar, multiecho pulse sequences of the brain and surrounding
structures were obtained without intravenous contrast.

[Series 2: t1_se_sag · sagittal · 5.0mm · 0.45mm/px · 3 of 26 slices shown (1 of 2)]
[im 1/26]
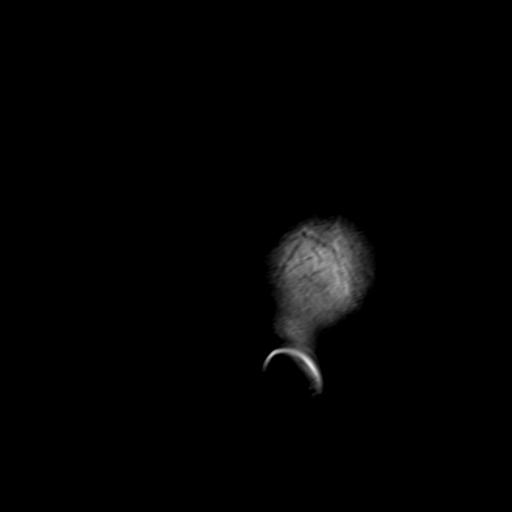
[im 13/26]
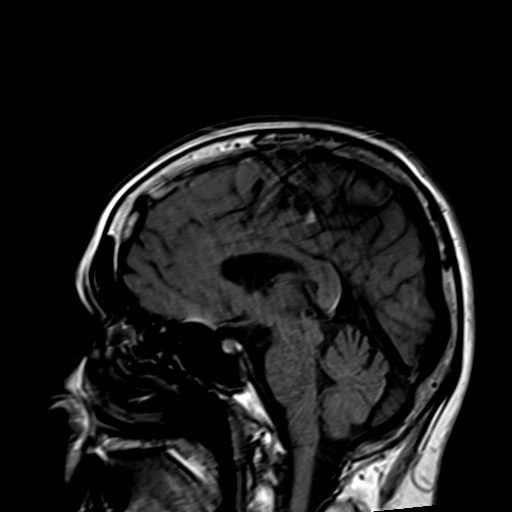
[im 26/26]
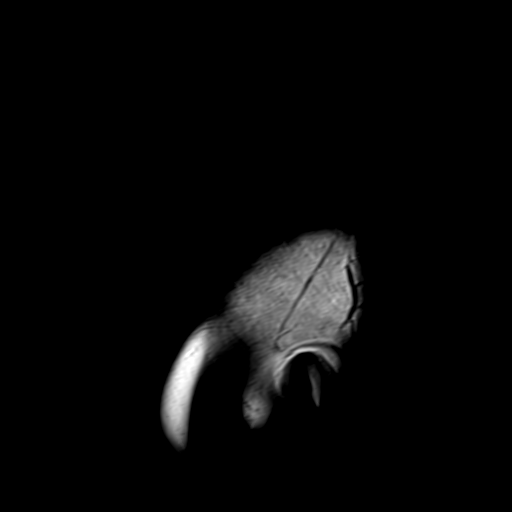

[Series 3: ep2d_diff_3 · axial · 3.0mm · 1.80mm/px · z∈[-57,+88]mm · 8 of 101 slices shown]
[im 1/101]
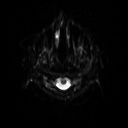
[im 15/101]
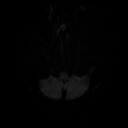
[im 29/101]
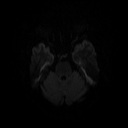
[im 43/101]
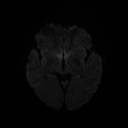
[im 58/101]
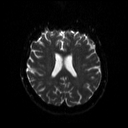
[im 72/101]
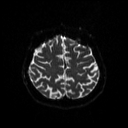
[im 86/101]
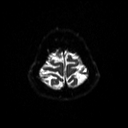
[im 101/101]
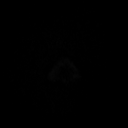

[Series 4: ep2d_diff_3_adc · axial · 3.0mm · 1.80mm/px · z∈[-57,+88]mm · 4 of 50 slices shown]
[im 1/50]
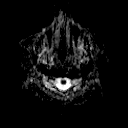
[im 17/50]
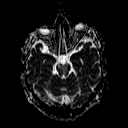
[im 33/50]
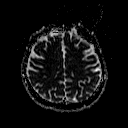
[im 50/50]
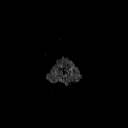

[Series 5: ep2d_diff_cor · coronal · 5.0mm · 1.77mm/px · 4 of 55 slices shown]
[im 1/55]
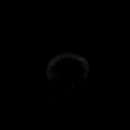
[im 19/55]
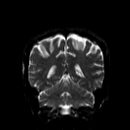
[im 37/55]
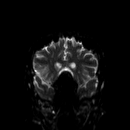
[im 55/55]
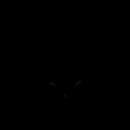

[Series 6: ep2d_diff_cor_adc · coronal · 5.0mm · 1.77mm/px · 2 of 28 slices shown]
[im 1/28]
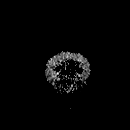
[im 28/28]
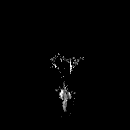

[Series 8: swi_images · axial · 2.0mm · 0.90mm/px · z∈[-63,+90]mm · 6 of 80 slices shown]
[im 1/80]
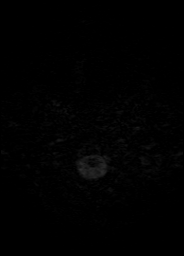
[im 16/80]
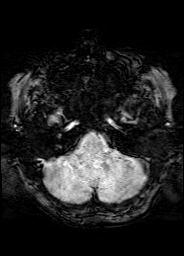
[im 32/80]
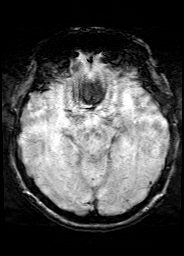
[im 48/80]
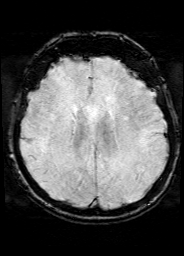
[im 64/80]
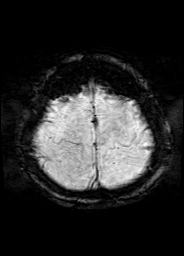
[im 80/80]
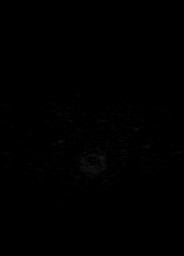

[Series 9: FLAIR · axial · 3.0mm · 0.43mm/px · z∈[-63,+88]mm · 2 of 27 slices shown]
[im 1/27]
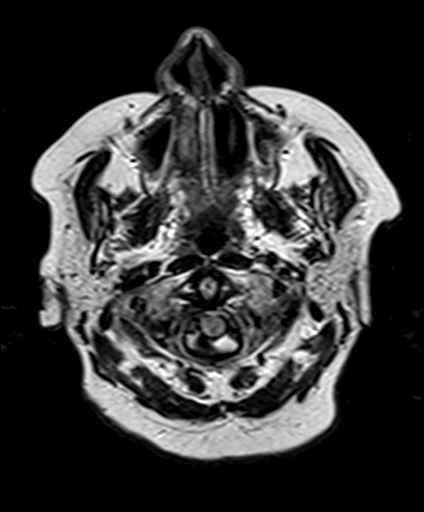
[im 27/27]
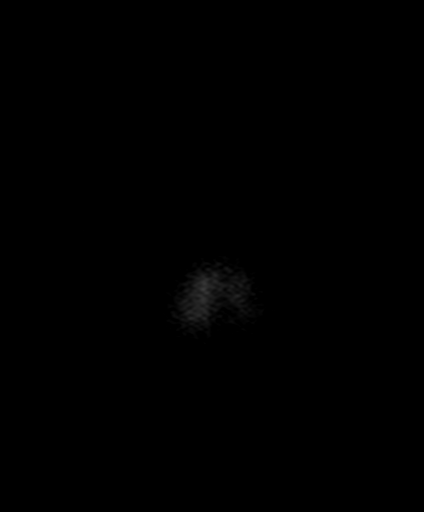

[Series 10: t2_tse_tra_512 · axial · 5.0mm · 0.60mm/px · z∈[-61,+89]mm · 2 of 27 slices shown]
[im 1/27]
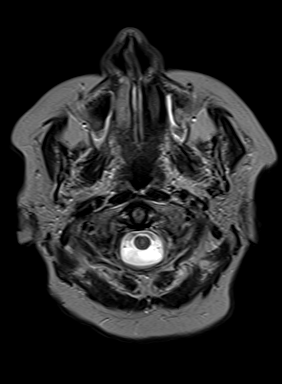
[im 27/27]
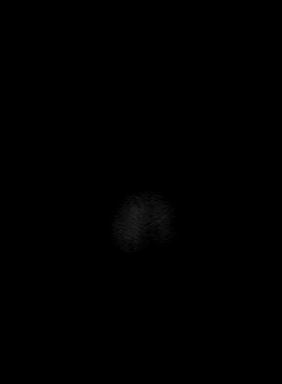

[Series 11: t1_mpr_tra · axial · 1.0mm · 0.72mm/px · z∈[-60,+93]mm · 13 of 160 slices shown]
[im 1/160]
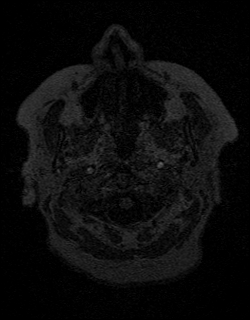
[im 14/160]
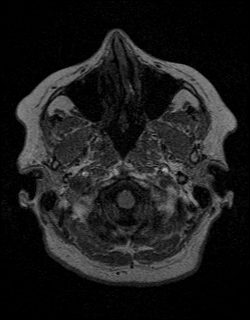
[im 27/160]
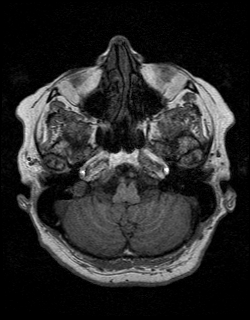
[im 40/160]
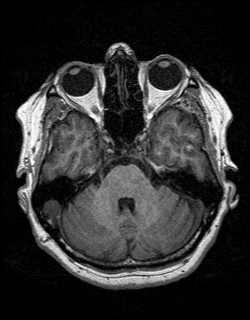
[im 54/160]
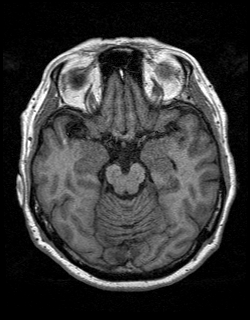
[im 67/160]
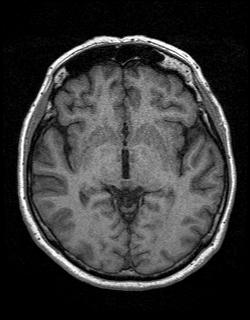
[im 80/160]
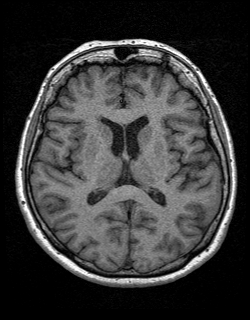
[im 93/160]
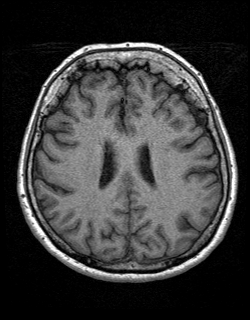
[im 107/160]
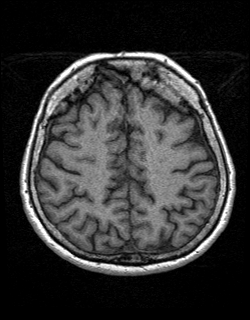
[im 120/160]
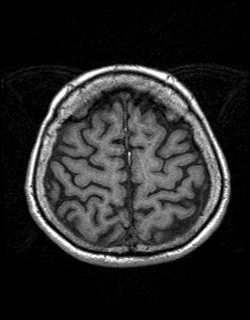
[im 133/160]
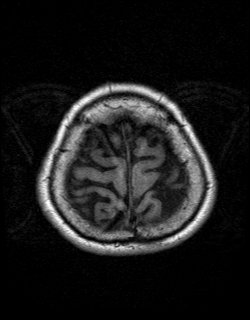
[im 146/160]
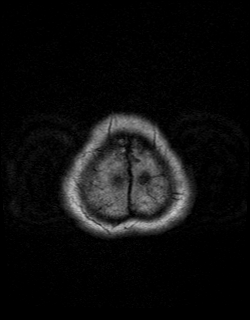
[im 160/160]
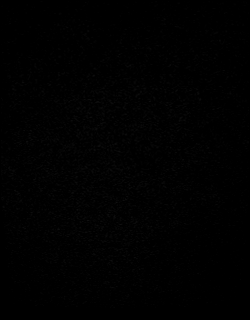

[Series 12: T2 · coronal · 5.0mm · 0.45mm/px · 2 of 30 slices shown]
[im 1/30]
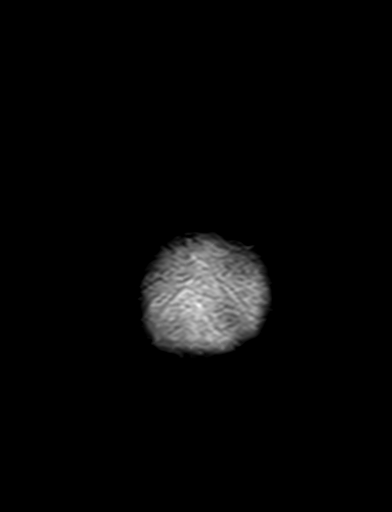
[im 30/30]
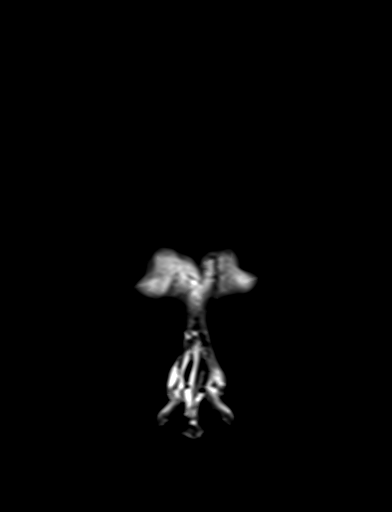

[Series 13: t1_se_sag · sagittal · 5.0mm · 0.45mm/px · 2 of 26 slices shown (2 of 2)]
[im 1/26]
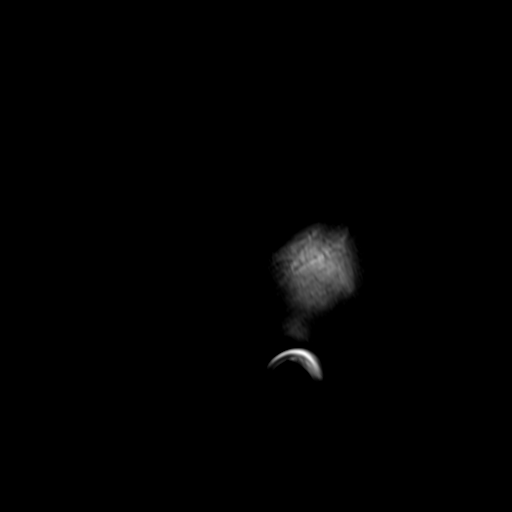
[im 26/26]
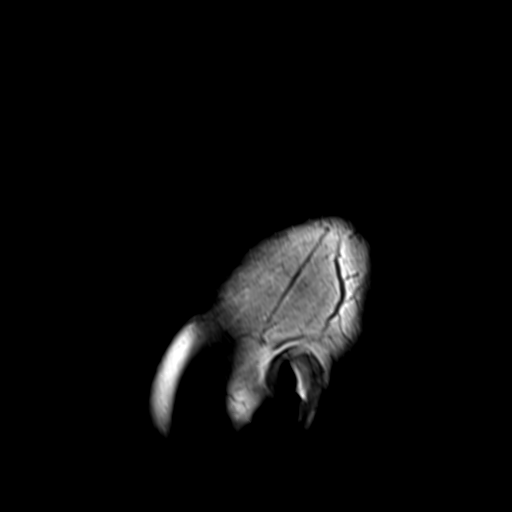

[48 of 48 positions shown; findings below may reference images not displayed]

FINDINGS: Brain: No acute infarction, hemorrhage, hydrocephalus, extra-axial
collection or mass lesion.

A few scattered foci of T2 hyperintensity are seen within white
matter of the cerebral hemispheres, nonspecific, most likely related
to chronic microvascular ischemic changes.

Vascular: Normal flow voids.

Skull and upper cervical spine: Normal marrow signal.

Sinuses/Orbits: Mild mucosal thickening scattered throughout the
paranasal sinuses with small fluid level within the left sphenoid
sinus. The orbits are maintained.
IMPRESSION: 1. Few scattered foci of T2 hyperintensity within white matter of
the cerebral hemispheres, nonspecific, most likely related to
chronic microvascular ischemic changes.
2. Mild paranasal sinus disease with small fluid level within the
left sphenoid sinus. Correlate for acute sinusitis.

## 2020-04-19 ENCOUNTER — Other Ambulatory Visit: Payer: Self-pay | Admitting: Family Medicine

## 2020-05-02 ENCOUNTER — Other Ambulatory Visit: Payer: Self-pay | Admitting: *Deleted

## 2020-05-02 MED ORDER — FLUTICASONE-SALMETEROL 250-50 MCG/DOSE IN AEPB: 1.0000 | INHALATION_SPRAY | Freq: Two times a day (BID) | RESPIRATORY_TRACT | 2 refills | Status: DC

## 2020-05-02 NOTE — Telephone Encounter (Signed)
Rx done. 

## 2020-05-08 ENCOUNTER — Encounter: Payer: Self-pay | Admitting: Neurology

## 2020-05-08 ENCOUNTER — Ambulatory Visit (INDEPENDENT_AMBULATORY_CARE_PROVIDER_SITE_OTHER): Payer: Medicare Other | Admitting: Neurology

## 2020-05-08 ENCOUNTER — Other Ambulatory Visit: Payer: Self-pay

## 2020-05-08 VITALS — BP 114/73 | HR 72 | Ht 68.0 in | Wt 216.8 lb

## 2020-05-08 DIAGNOSIS — R252 Cramp and spasm: Secondary | ICD-10-CM | POA: Diagnosis not present

## 2020-05-08 DIAGNOSIS — R29818 Other symptoms and signs involving the nervous system: Secondary | ICD-10-CM

## 2020-05-08 DIAGNOSIS — R259 Unspecified abnormal involuntary movements: Secondary | ICD-10-CM

## 2020-05-08 NOTE — Progress Notes (Signed)
[facility] Clinic Note - Initial Visit   Date: 05/08/20  Jillian Guerrero MRN: [id number] DOB: 1946/01/23   Dear Dr. Hassan Rowan:  Thank you for your kind referral of Jillian Guerrero for consultation of transient neurological symptoms. Although her history is well known to you, please allow Jillian Guerrero to reiterate it for the purpose of our medical record. The patient was accompanied to the clinic by self.    History of Present Illness: Jillian Guerrero is a 75 y.o. right-handed [place] female with GERD, hyperlipidemia, asthma presenting for evaluation of transient neurological complaints. She has neuropathy in the feet manifesting with tingling/numbness for the past 15 years. She was started on gabapentin, and takes 1200mg  at bedtime which adequately controls her painful paresthesias.  She also complains about having involuntary foot inversion, which would last about 5-10 seconds and started in August 2021.  Since this time, it occurred several times and would self resolve.  She has not noticed any additional spells since increasing her gabapentin to 1200mg  at bedtime.   In early January, she has an episode where she was walking, but unable to raise her left leg, she felt as if it was very heavy.  She is able to move the leg and feet, but he feels "heavy" as if someone put weight on it. It can occur for 10 days.  MRI brain was completed in February 2022 which showed chronic microvascular ischemic vascular disease, no acute stroke.   She complains of generalized painful muscle cramps and spasms.  About three years ago, she has one episode of acute severe, deep, pain in her left hip, thigh, and lower leg, which awakened her from sleeping. Pain is characterized as squeezing/searing pain, so sever that her husband called EMS because she lost consciousness from the pain.  When EMS arrived, her blood pressure was very low.  No identifiable reason was found.  Since this time, she has  occasional spells of left leg pain which is always worse when she is laying down and finds that walking helps alleviate the pain.  She denies any problems with walking or leg weakness.     She moved from [place] in October 2021 to be closer to her daughter.    Out-side paper records, electronic medical record, and images have been reviewed where available and summarized as:  MRI brain wo contrast 04/14/2020: 1. Few scattered foci of T2 hyperintensity within white matter of the cerebral hemispheres, nonspecific, most likely related to chronic microvascular ischemic changes. 2. Mild paranasal sinus disease with small fluid level within the left sphenoid sinus. Correlate for acute sinusitis.   Lab Results  Component Value Date   VITAMINB12 380 03/24/2020   Lab Results  Component Value Date   TSH 1.92 03/24/2020     Past Medical History:  Diagnosis Date  . GERD (gastroesophageal reflux disease)   . Hyperlipidemia   . Lumbar compression fracture (HCC)   . Osteoporosis   . Peripheral neuropathy     Past Surgical History:  Procedure Laterality Date  . APPENDECTOMY  1956  . BLADDER SUSPENSION  2003  . BREAST BIOPSY  2015  . hyaluronic acid viscosupplementation Bilateral    knees - 01/16/2018 x2  . KYPHOPLASTY  10/2018  . TONSILLECTOMY  1964  . UPPER GI ENDOSCOPY  03/08/2019   gastritis     Medications:  Outpatient Encounter Medications as of 05/08/2020  Medication Sig  . albuterol (VENTOLIN HFA) 108 (90 Base) MCG/ACT inhaler Inhale 2  puffs into the lungs every 6 (six) hours as needed for wheezing or shortness of breath.  Marland Kitchen alendronate (FOSAMAX) 70 MG tablet Take 1 tablet (70 mg total) by mouth every 7 (seven) days. Take with a full glass of water on an empty stomach.  Marland Kitchen atorvastatin (LIPITOR) 10 MG tablet TAKE 1 TABLET EVERY DAY  . calcium citrate-vitamin D (CITRACAL+D) 315-200 MG-UNIT tablet Take 1 tablet by mouth 2 (two) times daily.  . cetirizine (ZYRTEC) 10 MG tablet  Take 10 mg by mouth daily.  . Fluticasone-Salmeterol (ADVAIR DISKUS) 250-50 MCG/DOSE AEPB Inhale 1 puff into the lungs in the morning and at bedtime.  . gabapentin (NEURONTIN) 600 MG tablet TAKE 1 TABLET TWICE DAILY  . pantoprazole (PROTONIX) 40 MG tablet TAKE 1 TABLET EVERY DAY  . Spacer/Aero-Holding Chambers DEVI 1 Device by Does not apply route daily as needed.  . [DISCONTINUED] CALCIUM PO Take by mouth daily.   No facility-administered encounter medications on file as of 05/08/2020.    Allergies:  Allergies  Allergen Reactions  . Collagen     Redness, swelling    Family History: Family History  Problem Relation Age of Onset  . Arthritis Mother   . Heart disease Mother   . Arthritis Father   . Depression Father   . Arthritis Brother   . High blood pressure Brother   . High Cholesterol Brother   . Arthritis Maternal Grandfather   . Asthma Paternal Grandmother   . Depression Paternal Grandmother   . Heart disease Paternal Grandmother   . High Cholesterol Paternal Grandmother   . Arthritis Son   . Heart attack Son   . High Cholesterol Son   . Arthritis Daughter     Social History: Social History   Tobacco Use  . Smoking status: Former Games developer  . Smokeless tobacco: Never Used  Vaping Use  . Vaping Use: Never used  Substance Use Topics  . Alcohol use: Never  . Drug use: Never   Social History   Social History Narrative   Right handed    Lives alone     Vital Signs:  BP 114/73   Pulse 72   Ht 5\' 8"  (1.727 m)   Wt 216 lb 12.8 oz (98.3 kg)   SpO2 96%   BMI 32.96 kg/m   Neurological Exam: MENTAL STATUS including orientation to time, place, person, recent and remote memory, attention span and concentration, language, and fund of knowledge is normal.  Speech is not dysarthric.  CRANIAL NERVES: II:  No visual field defects.  III-IV-VI: Pupils equal round and reactive to light.  Normal conjugate, extra-ocular eye movements in all directions of gaze.  No  nystagmus.  No ptosis.   V:  Normal facial sensation.    VII:  Normal facial symmetry and movements.   VIII:  Normal hearing and vestibular function.   IX-X:  Normal palatal movement.   XI:  Normal shoulder shrug and head rotation.   XII:  Normal tongue strength and range of motion, no deviation or fasciculation.  MOTOR:  No atrophy, fasciculations or abnormal movements.  No pronator drift.   Upper Extremity:  Right  Left  Deltoid  5/5   5/5   Biceps  5/5   5/5   Triceps  5/5   5/5   Infraspinatus 5/5  5/5  Medial pectoralis 5/5  5/5  Wrist extensors  5/5   5/5   Wrist flexors  5/5   5/5   Finger extensors  5/5  5/5   Finger flexors  5/5   5/5   Dorsal interossei  5/5   5/5   Abductor pollicis  5/5   5/5   Tone (Ashworth scale)  0  0   Lower Extremity:  Right  Left  Hip flexors  5/5   5/5   Hip extensors  5/5   5/5   Adductor 5/5  5/5  Abductor 5/5  5/5  Knee flexors  5/5   5/5   Knee extensors  5/5   5/5   Dorsiflexors  5/5   5/5   Plantarflexors  5/5   5/5   Toe extensors  5/5   5/5   Toe flexors  5/5   5/5   Tone (Ashworth scale)  0  0   MSRs:  Right        Left                  brachioradialis 2+  2+  biceps 2+  2+  triceps 2+  2+  patellar 2+  2+  ankle jerk 0  0  Hoffman no  no  plantar response down  down   SENSORY:  Normal and symmetric perception of light touch, pinprick, vibration, and proprioception.  Romberg's sign absent.   COORDINATION/GAIT: Normal finger-to- nose-finger.  Intact rapid alternating movements bilaterally. Gait narrow based and stable. Tandem and stressed gait intact.    IMPRESSION: 1.  Episodic left leg weakness  - No evidence of stroke on MRI brain  - Check CTA head and neck to assess vessel status  2.  Left foot inversion ?dystonia vs focal seizure  - Routine EEG  3.  Episodic left leg pain.  Nature of pain is not suggestive of radiculopathy or neuropathy.    - MRI lumbar spine has been requested to review  - NCS/EMG of the  left arm and leg  4.  Idiopathic peripheral neuropathy  - Continue gabapentin 1200mg  at bedtime for pain  Follow-up after testing  Total time spent reviewing records, interview, history/exam, documentation, counseling and coordination of care on day of encounter:  60 min   Thank you for allowing me to participate in patient's care.  If I can answer any additional questions, I would be pleased to do so.    Sincerely,    Jazzmine Kleiman K. , DO

## 2020-05-08 NOTE — Patient Instructions (Signed)
We will check CTA head and neck   Routine EEG  Please provide a copy of your last MRI lumbar spine

## 2020-05-22 ENCOUNTER — Ambulatory Visit (INDEPENDENT_AMBULATORY_CARE_PROVIDER_SITE_OTHER): Payer: Medicare Other | Admitting: Neurology

## 2020-05-22 ENCOUNTER — Other Ambulatory Visit: Payer: Self-pay

## 2020-05-22 DIAGNOSIS — R252 Cramp and spasm: Secondary | ICD-10-CM | POA: Diagnosis not present

## 2020-05-22 DIAGNOSIS — R29818 Other symptoms and signs involving the nervous system: Secondary | ICD-10-CM

## 2020-05-22 DIAGNOSIS — R259 Unspecified abnormal involuntary movements: Secondary | ICD-10-CM

## 2020-05-23 ENCOUNTER — Other Ambulatory Visit: Payer: Medicare Other

## 2020-05-23 NOTE — Procedures (Signed)
ELECTROENCEPHALOGRAM REPORT  Date of Study: 05/22/2020  Patient's Name: Jillian Guerrero MRN: [id number] Date of Birth: [birth year]  Referring Provider: Dr. Nita Sickle  Clinical History: This is a 75 year old woman with episodes of involuntary left foot inversion, left leg heaviness.  Medications: NEURONTIN 600 MG tablet VENTOLIN HFA 108 (90 Base) MCG/ACT inhaler FOSAMAX 70 MG tablet LIPITOR 10 MG tablet CITRACAL+D 315-200 MG-UNIT tablet ZYRTEC 10 MG tablet ADVAIR DISKUS 250-50 MCG/DOSE AEPB PROTONIX 40 MG tablet   Technical Summary: A multichannel digital EEG recording measured by the international 10-20 system with electrodes applied with paste and impedances below 5000 ohms performed in our laboratory with EKG monitoring in an awake and asleep patient.  Hyperventilation was not performed. Photic stimulation was performed.  The digital EEG was referentially recorded, reformatted, and digitally filtered in a variety of bipolar and referential montages for optimal display.    Description: The patient is awake and asleep during the recording.  During maximal wakefulness, there is a symmetric, medium voltage 9-10 Hz posterior dominant rhythm that attenuates with eye opening.  The record is symmetric.  During drowsiness and sleep, there is an increase in theta slowing of the background.  Vertex waves and symmetric sleep spindles were seen.  Photic stimulation did not elicit any abnormalities.  There were no epileptiform discharges or electrographic seizures seen.    EKG lead was unremarkable.  Impression: This awake and asleep EEG is normal.    Clinical Correlation: A normal EEG does not exclude a clinical diagnosis of epilepsy.  If further clinical questions remain, prolonged EEG may be helpful.  Clinical correlation is advised.   Patrcia Dolly, M.D.

## 2020-05-26 ENCOUNTER — Other Ambulatory Visit: Payer: Medicare Other

## 2020-05-27 ENCOUNTER — Ambulatory Visit
Admission: RE | Admit: 2020-05-27 | Discharge: 2020-05-27 | Disposition: A | Discharge status: Home / Self Care | Payer: Medicare Other | Source: Ambulatory Visit | Attending: Neurology | Admitting: Neurology

## 2020-05-27 DIAGNOSIS — R252 Cramp and spasm: Secondary | ICD-10-CM

## 2020-05-27 DIAGNOSIS — R259 Unspecified abnormal involuntary movements: Secondary | ICD-10-CM

## 2020-05-27 DIAGNOSIS — R29818 Other symptoms and signs involving the nervous system: Secondary | ICD-10-CM

## 2020-05-27 IMAGING — CT CT ANGIO HEAD
1 of 4 series · 6 of 30 positions shown · IV contrast (iopamidol)
Comparison: None.

CLINICAL DATA: Transient neurological symptoms. Involuntary
movements. Muscle cramp. Left leg numbness and spasms for 1 year.

EXAM:
CT ANGIOGRAPHY HEAD AND NECK
TECHNIQUE: Multidetector CT imaging of the head and neck was performed using
the standard protocol during bolus administration of intravenous
contrast. Multiplanar CT image reconstructions and MIPs were
obtained to evaluate the vascular anatomy. Carotid stenosis
measurements (when applicable) are obtained utilizing NASCET
criteria, using the distal internal carotid diameter as the
denominator.
CONTRAST:  75mL [33] IOPAMIDOL ([33]) INJECTION 76%

[Series 7: head/neck angio · axial · 0.46mm/px · z∈[-305,-33]mm · 6 of 192 slices shown]
[im 28/192  brain]
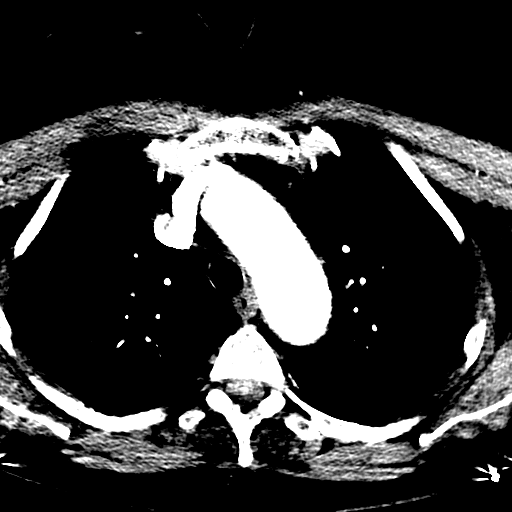
[im 55/192  bone]
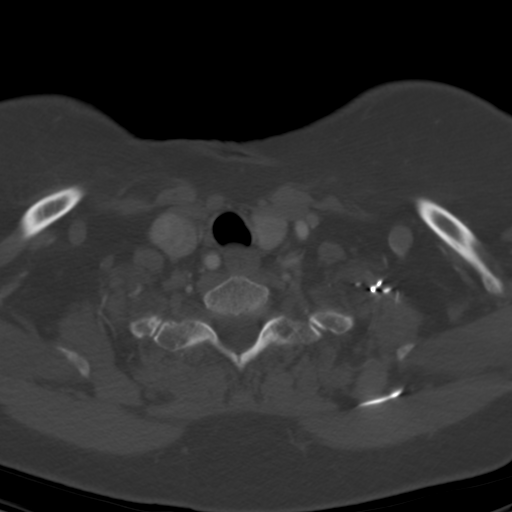
[im 82/192  brain]
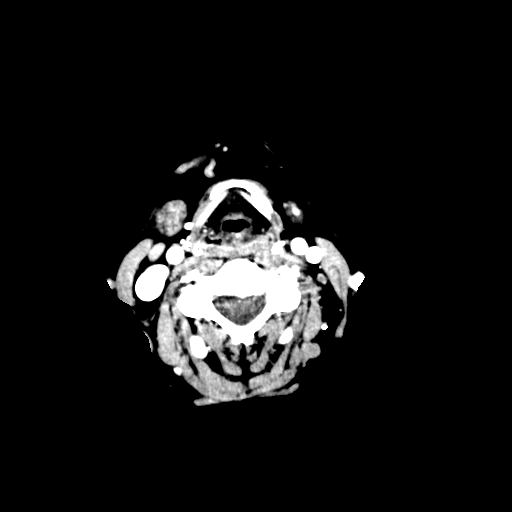
[im 110/192  bone]
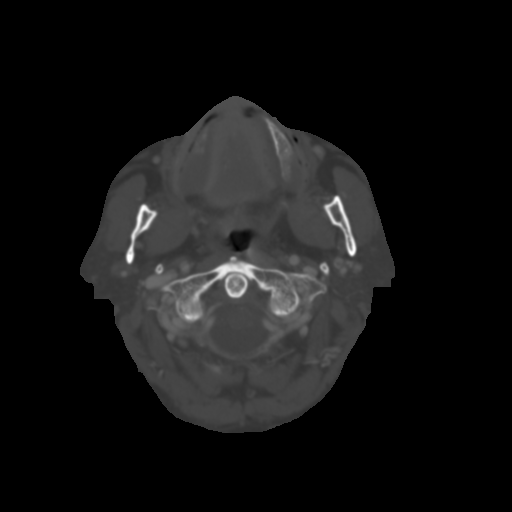
[im 137/192  brain]
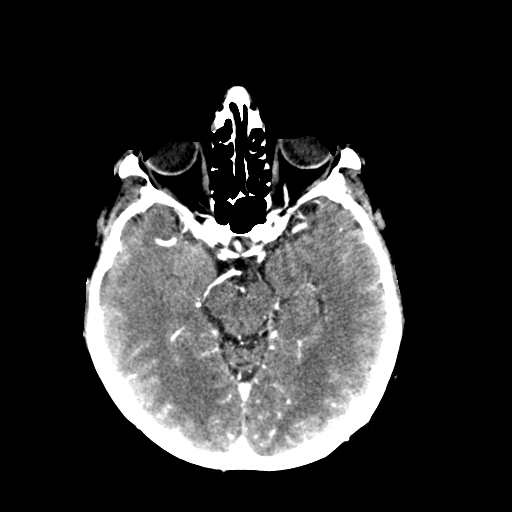
[im 164/192  bone]
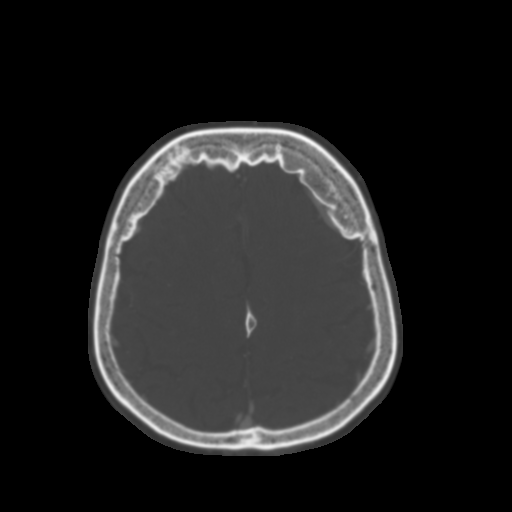

[6 of 30 positions shown; findings below may reference images not displayed]

FINDINGS: CT HEAD FINDINGS

Brain: There is no evidence of an acute infarct, intracranial
hemorrhage, mass, midline shift, or extra-axial fluid collection.
The ventricles and sulci are normal.

Vascular: No hyperdense vessel.

Skull: No fracture or suspicious osseous lesion.

Sinuses: Small volume fluid in the right maxillary sinus and mild
mucosal thickening in the left maxillary sinus. Clear mastoid air
cells.

Orbits: Right cataract extraction.

Review of the MIP images confirms the above findings

CTA NECK FINDINGS

Aortic arch: Standard 3 vessel aortic arch with widely patent arch
vessel origins.

Right carotid system: Patent without evidence of stenosis or
dissection.

Left carotid system: Patent without evidence of stenosis or
dissection.

Vertebral arteries: Patent without evidence of stenosis or
dissection.

Skeleton: No suspicious osseous lesion.

Other neck: No evidence of cervical lymphadenopathy or mass.

Upper chest: Clear lung apices.

Review of the MIP images confirms the above findings

CTA HEAD FINDINGS

Anterior circulation: The internal carotid arteries are widely
patent from skull base to carotid termini. ACAs and MCAs are patent
without evidence of a proximal branch occlusion or significant
proximal stenosis. No aneurysm is identified.

Posterior circulation: The intracranial vertebral arteries are
patent to the basilar with the right being dominant. Patent PICA and
SCA origins are identified bilaterally. The basilar artery is widely
patent. There are large posterior communicating arteries and
hypoplastic P1 segments bilaterally. No aneurysm is identified.

Venous sinuses: Patent.

Anatomic variants: Fetal origin of the PCAs.

Review of the MIP images confirms the above findings
IMPRESSION: Negative head and neck CTA.

## 2020-05-27 IMAGING — CT CT ANGIO NECK
1 of 4 series · 2 of 16 positions shown · IV contrast (APPLIED)
Comparison: None.

CLINICAL DATA: Transient neurological symptoms. Involuntary
movements. Muscle cramp. Left leg numbness and spasms for 1 year.

EXAM:
CT ANGIOGRAPHY HEAD AND NECK
TECHNIQUE: Multidetector CT imaging of the head and neck was performed using
the standard protocol during bolus administration of intravenous
contrast. Multiplanar CT image reconstructions and MIPs were
obtained to evaluate the vascular anatomy. Carotid stenosis
measurements (when applicable) are obtained utilizing NASCET
criteria, using the distal internal carotid diameter as the
denominator.
CONTRAST:  75mL [33] IOPAMIDOL ([33]) INJECTION 76%

[Series 7: head/neck angio · axial · 0.46mm/px · z∈[-233,-105]mm · 2 of 192 slices shown]
[im 64/192  soft-tissue]
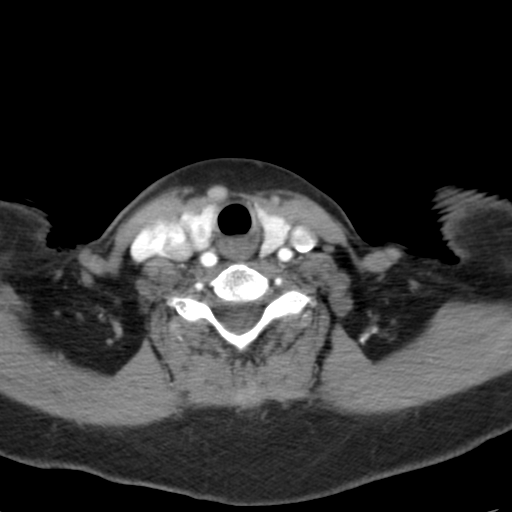
[im 128/192  bone]
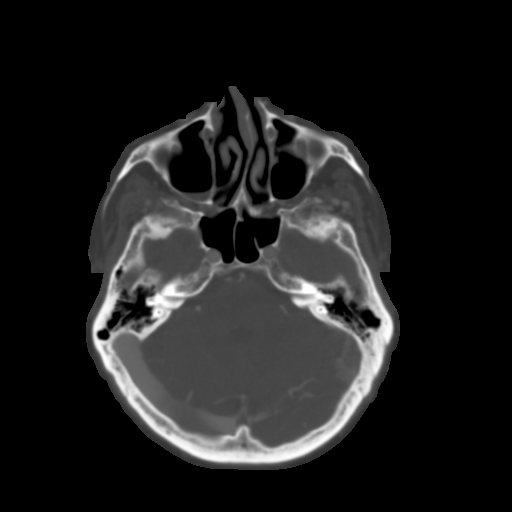

[2 of 16 positions shown; findings below may reference images not displayed]

FINDINGS: CT HEAD FINDINGS

Brain: There is no evidence of an acute infarct, intracranial
hemorrhage, mass, midline shift, or extra-axial fluid collection.
The ventricles and sulci are normal.

Vascular: No hyperdense vessel.

Skull: No fracture or suspicious osseous lesion.

Sinuses: Small volume fluid in the right maxillary sinus and mild
mucosal thickening in the left maxillary sinus. Clear mastoid air
cells.

Orbits: Right cataract extraction.

Review of the MIP images confirms the above findings

CTA NECK FINDINGS

Aortic arch: Standard 3 vessel aortic arch with widely patent arch
vessel origins.

Right carotid system: Patent without evidence of stenosis or
dissection.

Left carotid system: Patent without evidence of stenosis or
dissection.

Vertebral arteries: Patent without evidence of stenosis or
dissection.

Skeleton: No suspicious osseous lesion.

Other neck: No evidence of cervical lymphadenopathy or mass.

Upper chest: Clear lung apices.

Review of the MIP images confirms the above findings

CTA HEAD FINDINGS

Anterior circulation: The internal carotid arteries are widely
patent from skull base to carotid termini. ACAs and MCAs are patent
without evidence of a proximal branch occlusion or significant
proximal stenosis. No aneurysm is identified.

Posterior circulation: The intracranial vertebral arteries are
patent to the basilar with the right being dominant. Patent PICA and
SCA origins are identified bilaterally. The basilar artery is widely
patent. There are large posterior communicating arteries and
hypoplastic P1 segments bilaterally. No aneurysm is identified.

Venous sinuses: Patent.

Anatomic variants: Fetal origin of the PCAs.

Review of the MIP images confirms the above findings
IMPRESSION: Negative head and neck CTA.

## 2020-05-27 MED ORDER — IOPAMIDOL (ISOVUE-370) INJECTION 76%
75.0000 mL | Freq: Once | INTRAVENOUS | Status: AC | PRN
  Administered 2020-05-27: 75 mL via INTRAVENOUS

## 2020-05-28 ENCOUNTER — Telehealth: Payer: Self-pay

## 2020-05-28 NOTE — Telephone Encounter (Signed)
-----   Message from Glendale Chard, DO sent at 05/27/2020  4:57 PM EDT ----- Please inform pt that her blood vessels to the head are normal.  Thanks.

## 2020-05-28 NOTE — Telephone Encounter (Signed)
Called patient and informed her of CT results. Patient verbalized understanding and had no further questions or concerns.

## 2020-06-07 ENCOUNTER — Other Ambulatory Visit: Payer: Self-pay

## 2020-06-07 ENCOUNTER — Ambulatory Visit
Admission: RE | Admit: 2020-06-07 | Discharge: 2020-06-07 | Disposition: A | Discharge status: Home / Self Care | Payer: Medicare Other | Source: Ambulatory Visit | Attending: Family Medicine | Admitting: Family Medicine

## 2020-06-07 DIAGNOSIS — E2839 Other primary ovarian failure: Secondary | ICD-10-CM

## 2020-06-19 ENCOUNTER — Ambulatory Visit: Payer: Medicare Other | Admitting: Neurology

## 2020-06-30 ENCOUNTER — Other Ambulatory Visit: Payer: Self-pay | Admitting: *Deleted

## 2020-06-30 MED ORDER — ALENDRONATE SODIUM 70 MG PO TABS: 70.0000 mg | ORAL_TABLET | ORAL | 3 refills | Status: DC

## 2020-06-30 NOTE — Telephone Encounter (Signed)
Rx done. 

## 2020-07-02 ENCOUNTER — Ambulatory Visit (INDEPENDENT_AMBULATORY_CARE_PROVIDER_SITE_OTHER): Payer: Medicare Other | Admitting: Neurology

## 2020-07-02 ENCOUNTER — Other Ambulatory Visit: Payer: Self-pay

## 2020-07-02 DIAGNOSIS — R29818 Other symptoms and signs involving the nervous system: Secondary | ICD-10-CM

## 2020-07-02 DIAGNOSIS — R259 Unspecified abnormal involuntary movements: Secondary | ICD-10-CM

## 2020-07-02 DIAGNOSIS — R252 Cramp and spasm: Secondary | ICD-10-CM

## 2020-07-02 NOTE — Procedures (Signed)
[facility]  [address] [place], [address]  [place], [place] [address] Tel: [phone] Fax:  [phone] Test Date:  07/02/2020  Patient: Jillian Guerrero DOB: Mar 08, 1945 Physician: Nita Sickle, DO  Sex: Female Height: 5\' 8"  Ref Phys: , DO  ID#: Nita Sickle   Technician:    Patient Complaints: This is a 75 year old female referred for evaluation of generalized muscle cramps and left leg pain.  NCV & EMG Findings: Extensive electrodiagnostic testing of the left upper and lower extremity shows:  1. All sensory responses including the left median, ulnar, mixed palmar, sural, and superficial peroneal nerves are within normal limits. 2. All motor responses including the left median, ulnar, peroneal, and tibial nerves are within normal limits. 3. Left tibial H reflex study is within normal limits. 4. There is no evidence of active or chronic motor axonal loss changes affecting any of the tested muscles.  Motor unit configuration and recruitment pattern is within normal limits.  Impression: This is a normal study of the left upper and lower extremities.  In particular, there is no evidence of a sensorimotor polyneuropathy, cervical/lumbosacral radiculopathy, or carpal tunnel syndrome   ___________________________ 61, DO    Nerve Conduction Studies Anti Sensory Summary Table   Stim Site NR Peak (ms) Norm Peak (ms) P-T Amp (V) Norm P-T Amp  Left Median Anti Sensory (2nd Digit)  33C  Wrist    2.8 <3.8 32.1 >10  Left Sup Peroneal Anti Sensory (Ant Lat Mall)  33C  12 cm    2.1 <4.6 11.6 >3  Left Sural Anti Sensory (Lat Mall)  33C  Calf    3.5 <4.6 10.9 >3  Left Ulnar Anti Sensory (5th Digit)  33C  Wrist    2.8 <3.2 18.1 >5   Motor Summary Table   Stim Site NR Onset (ms) Norm Onset (ms) O-P Amp (mV) Norm O-P Amp Site1 Site2 Delta-0 (ms) Dist (cm) Vel (m/s) Norm Vel (m/s)  Left Median Motor (Abd Poll Brev)  33C  Wrist    3.0 <4.0 7.5 >5 Elbow Wrist 4.9  29.0 59 >50  Elbow    7.9  7.4         Left Peroneal Motor (Ext Dig Brev)  33C  Ankle    2.7 <6.0 4.9 >2.5 B Fib Ankle 8.3 37.0 45 >40  B Fib    11.0  4.1  Poplt B Fib 1.5 10.0 67 >40  Poplt    12.5  4.0         Left Tibial Motor (Abd Hall Brev)  33C  Ankle    3.7 <6.0 13.2 >4 Knee Ankle 8.6 43.0 50 >40  Knee    12.3  8.6         Left Ulnar Motor (Abd Dig Minimi)  33C  Wrist    2.0 <3.1 7.1 >7 B Elbow Wrist 4.4 23.0 52 >50  B Elbow    6.4  6.2  A Elbow B Elbow 2.0 10.0 50 >50  A Elbow    8.4  6.0          Comparison Summary Table   Stim Site NR Peak (ms) Norm Peak (ms) P-T Amp (V) Site1 Site2 Delta-P (ms) Norm Delta (ms)  Left Median/Ulnar Palm Comparison (Wrist - 8cm)  33C  Median Palm    1.6 <2.2 64.0 Median Palm Ulnar Palm 0.0   Ulnar Palm    1.6 <2.2 11.9       H Reflex Studies  NR H-Lat (ms) Lat Norm (ms) L-R H-Lat (ms)  Left Tibial (Gastroc)  33C     34.56 <35    EMG   Side Muscle Ins Act Fibs Psw Fasc Number Recrt Dur Dur. Amp Amp. Poly Poly. Comment  Left AntTibialis Nml Nml Nml Nml Nml Nml Nml Nml Nml Nml Nml Nml N/A  Left Gastroc Nml Nml Nml Nml Nml Nml Nml Nml Nml Nml Nml Nml N/A  Left Flex Dig Long Nml Nml Nml Nml Nml Nml Nml Nml Nml Nml Nml Nml N/A  Left RectFemoris Nml Nml Nml Nml Nml Nml Nml Nml Nml Nml Nml Nml N/A  Left GluteusMed Nml Nml Nml Nml Nml Nml Nml Nml Nml Nml Nml Nml N/A  Left 1stDorInt Nml Nml Nml Nml Nml Nml Nml Nml Nml Nml Nml Nml N/A  Left PronatorTeres Nml Nml Nml Nml Nml Nml Nml Nml Nml Nml Nml Nml N/A  Left Biceps Nml Nml Nml Nml Nml Nml Nml Nml Nml Nml Nml Nml N/A  Left Triceps Nml Nml Nml Nml Nml Nml Nml Nml Nml Nml Nml Nml N/A  Left Deltoid Nml Nml Nml Nml Nml Nml Nml Nml Nml Nml Nml Nml N/A      Waveforms:

## 2020-07-15 NOTE — Progress Notes (Signed)
Addendum  MRI lumbar spine without contrast received from [facility] 11/13/2018:  Subacute to chronic compression fracture of the L1 vertebral body with retropulsion of the posterior superior endplate and moderate central canal narrowing at T12-L1, similar compared with May 04, 2018.  Multilevel degenerative changes of the lumbar spine.

## 2020-08-25 ENCOUNTER — Other Ambulatory Visit: Payer: Self-pay | Admitting: Family Medicine

## 2020-09-25 ENCOUNTER — Other Ambulatory Visit: Payer: Self-pay

## 2020-09-26 ENCOUNTER — Encounter: Payer: Self-pay | Admitting: Family Medicine

## 2020-09-26 ENCOUNTER — Ambulatory Visit (INDEPENDENT_AMBULATORY_CARE_PROVIDER_SITE_OTHER): Payer: Medicare Other | Admitting: Family Medicine

## 2020-09-26 VITALS — BP 128/82 | HR 80 | Temp 97.9°F | Ht 68.0 in | Wt 203.3 lb

## 2020-09-26 DIAGNOSIS — R1032 Left lower quadrant pain: Secondary | ICD-10-CM

## 2020-09-26 DIAGNOSIS — M545 Low back pain, unspecified: Secondary | ICD-10-CM

## 2020-09-26 NOTE — Progress Notes (Signed)
Ether Clema Skousen DOB: 05/25/1945 Encounter date: 09/26/2020  This is a 75 y.o. female who presents with Chief Complaint  Patient presents with   Back Pain    Patient complains of low back pain and left groin pain x1 month, no known injury    History of present illness:  Was under a lot of stress last time we talked. Everything got better except leg.   Was getting spasms in legs - gabapentin was helpful for this.   Now, states that something is wrong with legs. 1 month ago went on vacation.   Feels ok if she doesn't do anything. Was sitting around and started to have pain in left groin. When she bends over hurts in left groin. Sitting by pool ok; but hard for her to walk. Hurts so badly had to stop walking in groin/low back. Pain has not gone away since vacation 1 month ago.   Biggest problem when she goes to bed is in left leg - left thigh - feels deep inside.   Used topical cream voltaren which was helping some on vacation.   Legs just hurting when laying in bed. Just legs don't feel right; just left leg is worse.   Not exercising regularly.    Allergies  Allergen Reactions   Collagen     Redness, swelling   Current Meds  Medication Sig   alendronate (FOSAMAX) 70 MG tablet Take 1 tablet (70 mg total) by mouth every 7 (seven) days. Take with a full glass of water on an empty stomach.   atorvastatin (LIPITOR) 10 MG tablet TAKE 1 TABLET EVERY DAY   calcium citrate-vitamin D (CITRACAL+D) 315-200 MG-UNIT tablet Take 1 tablet by mouth 2 (two) times daily.   cetirizine (ZYRTEC) 10 MG tablet Take 10 mg by mouth daily.   gabapentin (NEURONTIN) 600 MG tablet TAKE 1 TABLET TWICE DAILY   pantoprazole (PROTONIX) 40 MG tablet TAKE 1 TABLET EVERY DAY   [DISCONTINUED] Fluticasone-Salmeterol (ADVAIR DISKUS) 250-50 MCG/DOSE AEPB Inhale 1 puff into the lungs in the morning and at bedtime.   [DISCONTINUED] Spacer/Aero-Holding Deretha Emory DEVI 1 Device by Does not apply route daily as needed.     Review of Systems  Constitutional:  Negative for chills, fatigue and fever.  Respiratory:  Negative for cough, chest tightness, shortness of breath and wheezing.   Cardiovascular:  Negative for chest pain, palpitations and leg swelling.  Gastrointestinal:  Negative for abdominal pain, constipation and diarrhea.  Musculoskeletal:  Positive for back pain and gait problem.   Objective:  BP 128/82 (BP Location: Left Arm, Patient Position: Sitting, Cuff Size: Large)   Pulse 80   Temp 97.9 F (36.6 C) (Oral)   Ht 5\' 8"  (1.727 m)   Wt 203 lb 4.8 oz (92.2 kg)   SpO2 96%   BMI 30.91 kg/m   Weight: 203 lb 4.8 oz (92.2 kg)   BP Readings from Last 3 Encounters:  09/26/20 128/82  05/08/20 114/73  03/24/20 118/72   Wt Readings from Last 3 Encounters:  09/26/20 203 lb 4.8 oz (92.2 kg)  05/08/20 216 lb 12.8 oz (98.3 kg)  03/24/20 215 lb (97.5 kg)    Physical Exam Pulmonary:     Effort: Pulmonary effort is normal.  Musculoskeletal:     Comments: No spinal tenderness to palpation. There is para lumbar muscle spasm L>R. There is increased pain with extension and with extension/twist. There is mild discomfort with internal rotation left hip. Mild tenderness to palpation left groin without obvious hernia  appreciated. Negative straight leg raise.   Neurological:     General: No focal deficit present.     Mental Status: She is alert.  Psychiatric:        Mood and Affect: Mood normal.    Assessment/Plan  1. Low back pain, unspecified back pain laterality, unspecified chronicity, unspecified whether sciatica present Start with imaging and determine next best step pending those results.  - DG Lumbar Spine Complete; Future  2. Left groin pain - DG HIP UNILAT WITH PELVIS 2-3 VIEWS LEFT; Future    Return for pending xray.    Theodis Shove, MD

## 2020-09-30 ENCOUNTER — Ambulatory Visit (INDEPENDENT_AMBULATORY_CARE_PROVIDER_SITE_OTHER)
Admission: RE | Admit: 2020-09-30 | Discharge: 2020-09-30 | Disposition: A | Discharge status: Home / Self Care | Payer: Medicare Other | Source: Ambulatory Visit | Attending: Family Medicine | Admitting: Family Medicine

## 2020-09-30 ENCOUNTER — Other Ambulatory Visit: Payer: Self-pay

## 2020-09-30 DIAGNOSIS — M545 Low back pain, unspecified: Secondary | ICD-10-CM | POA: Diagnosis not present

## 2020-09-30 DIAGNOSIS — R1032 Left lower quadrant pain: Secondary | ICD-10-CM

## 2020-09-30 IMAGING — DX DG HIP (WITH OR WITHOUT PELVIS) 2-3V*L*
3 series · 3 of 3 positions shown · non-contrast
Comparison: None.

CLINICAL DATA: Left hip and groin pain for 2 months. No known
injury.

EXAM:
DG HIP (WITH OR WITHOUT PELVIS) 2-3V LEFT

[pelvis ap]
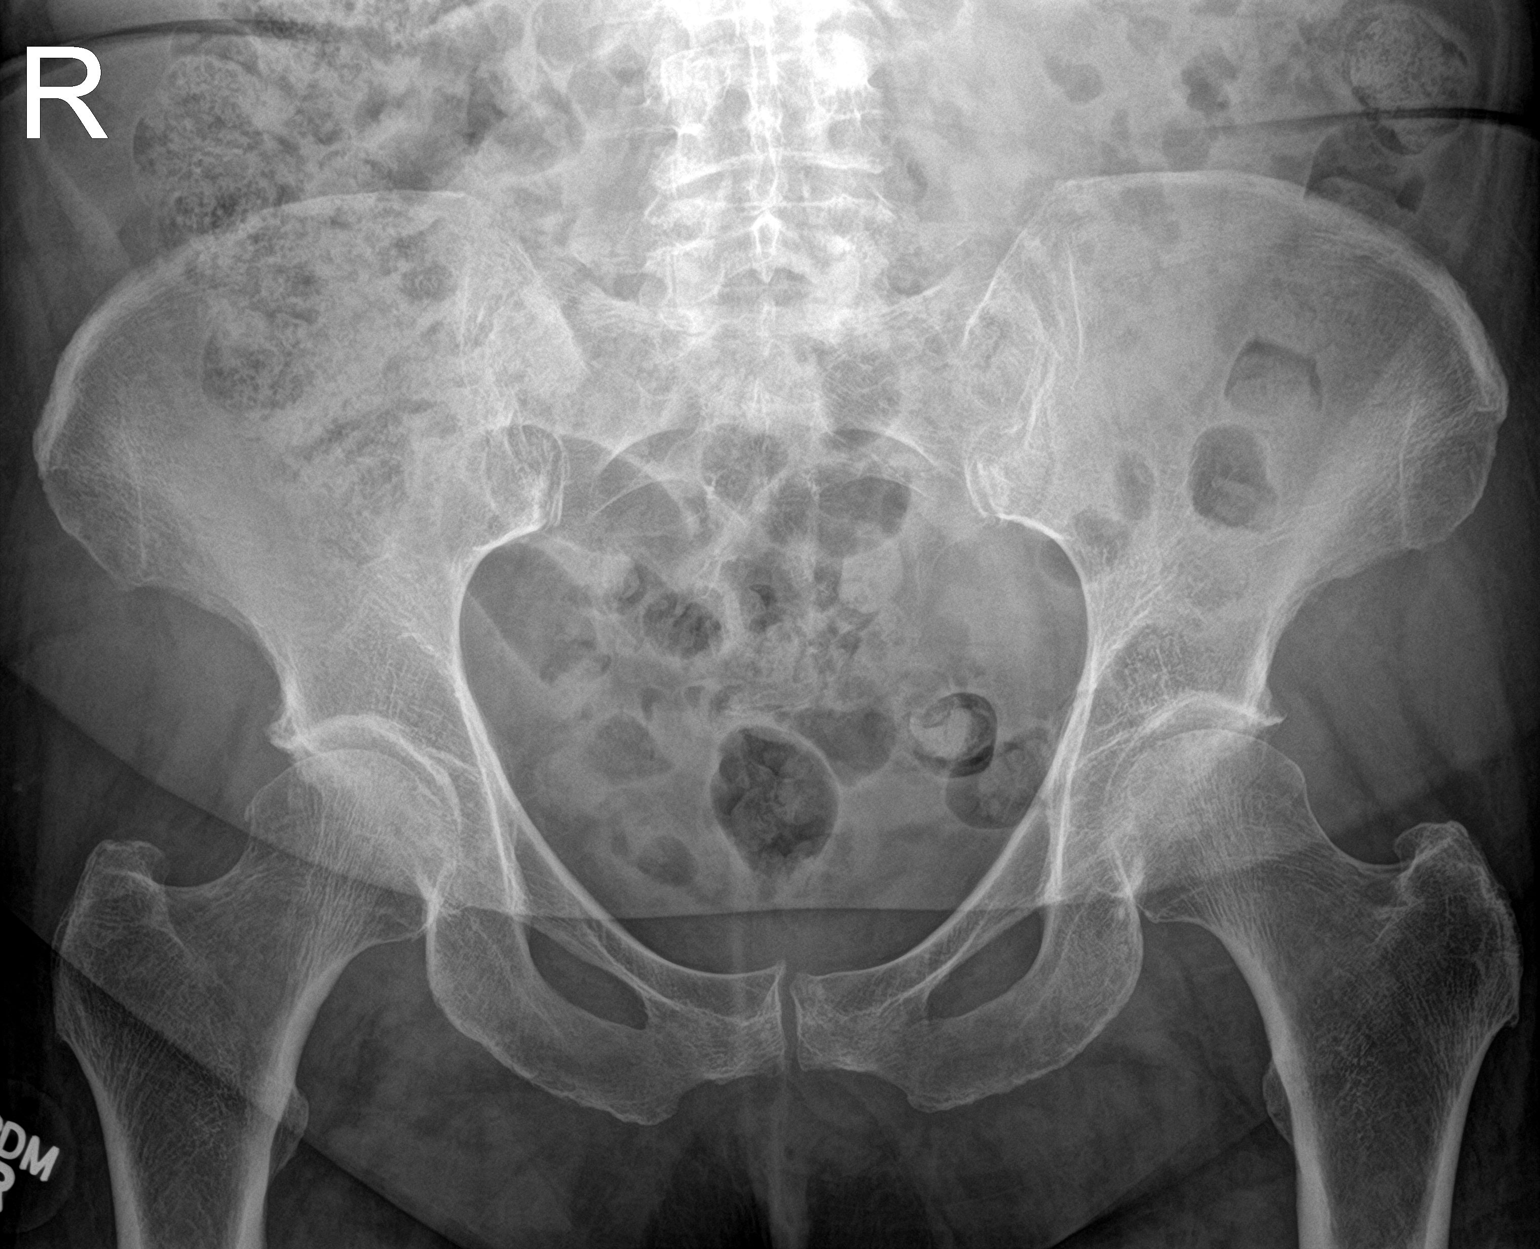

[hip ap]
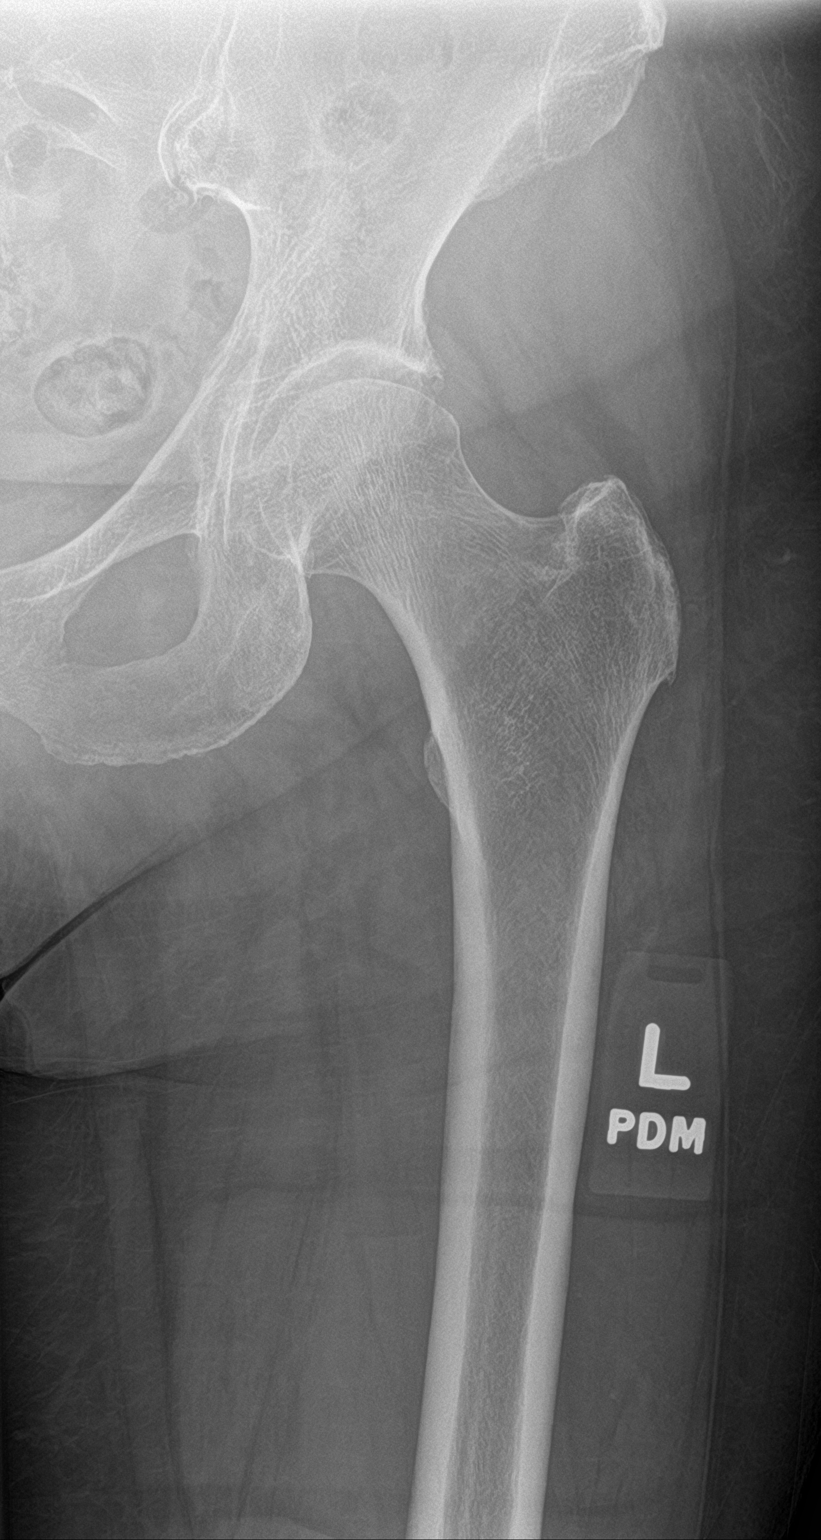

[hip lat]
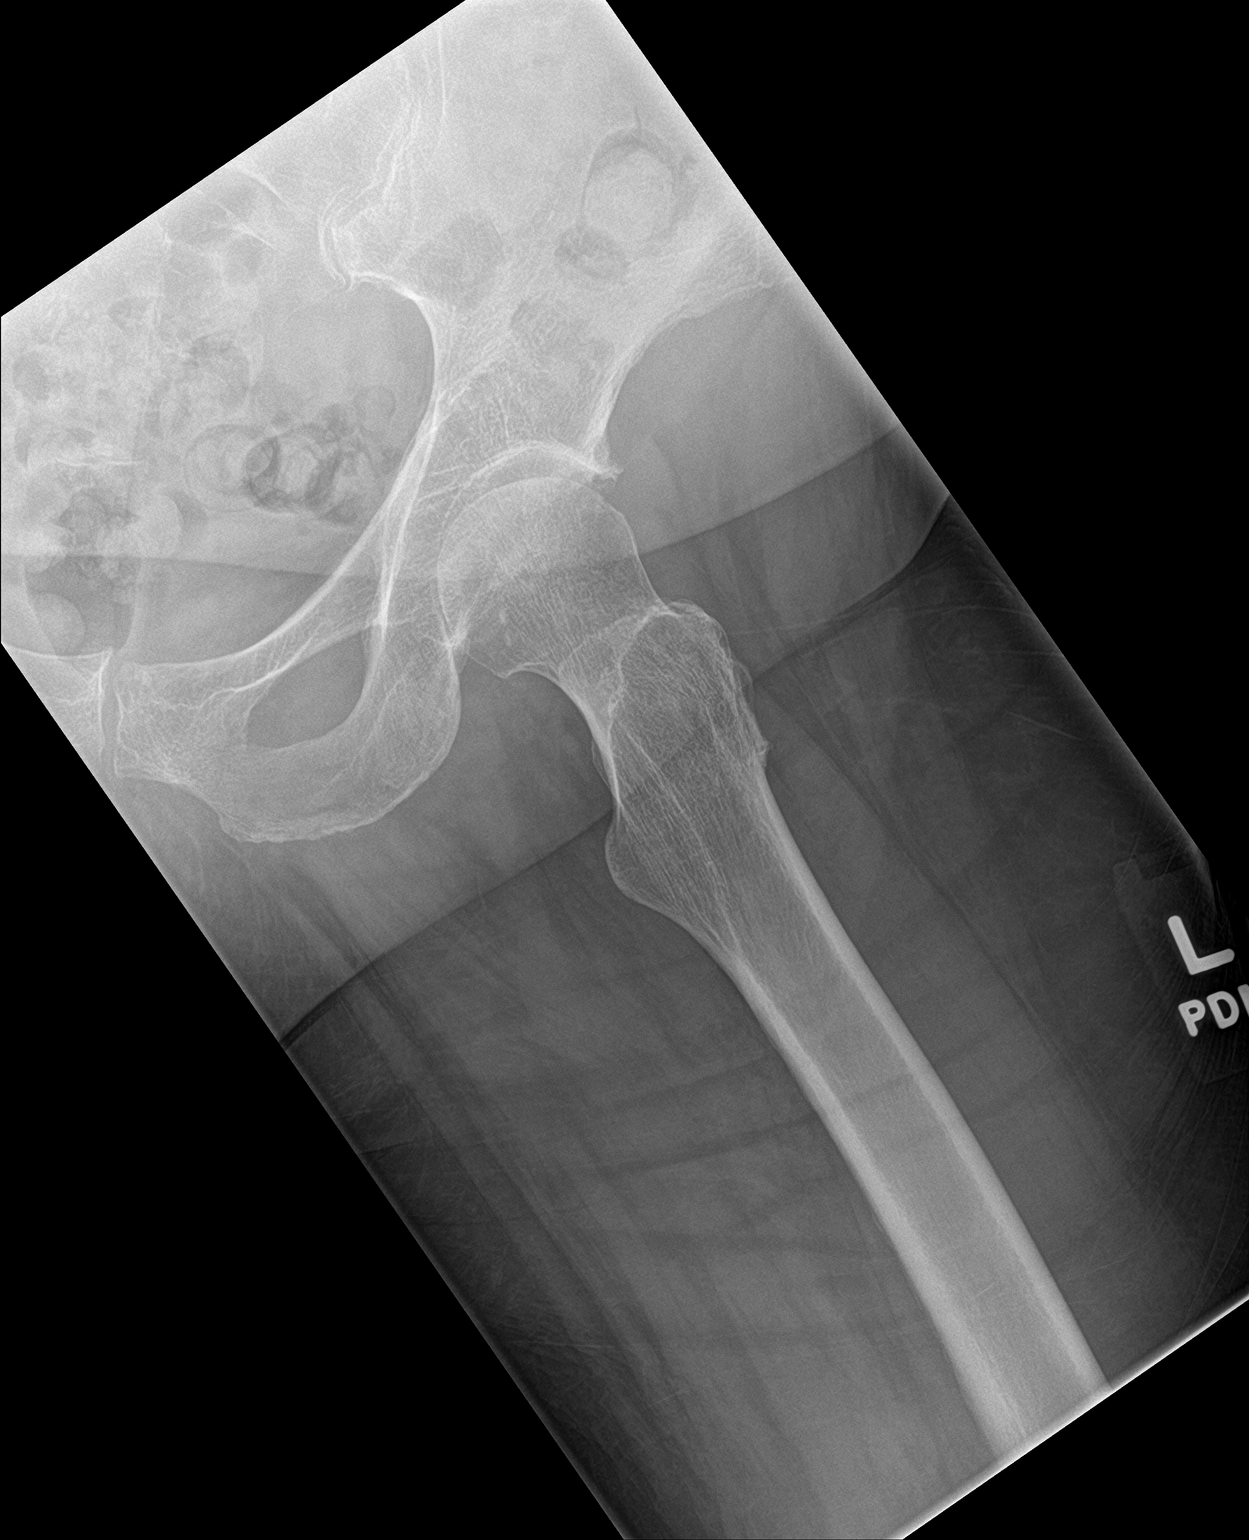

[3 of 3 positions shown; findings below may reference images not displayed]

FINDINGS: The mineralization and alignment are normal. There is no evidence of
acute fracture or dislocation. No evidence of femoral head avascular
necrosis. The hip and sacroiliac joint spaces are preserved.
IMPRESSION: No acute osseous findings or significant arthropathic changes.

## 2020-09-30 IMAGING — DX DG LUMBAR SPINE COMPLETE 4+V
5 series · 5 of 5 positions shown · non-contrast
Comparison: None.

CLINICAL DATA: Low back and left hip pain for 2 months. No known
injury.

EXAM:
LUMBAR SPINE - COMPLETE 4+ VIEW

[l-spine ap]
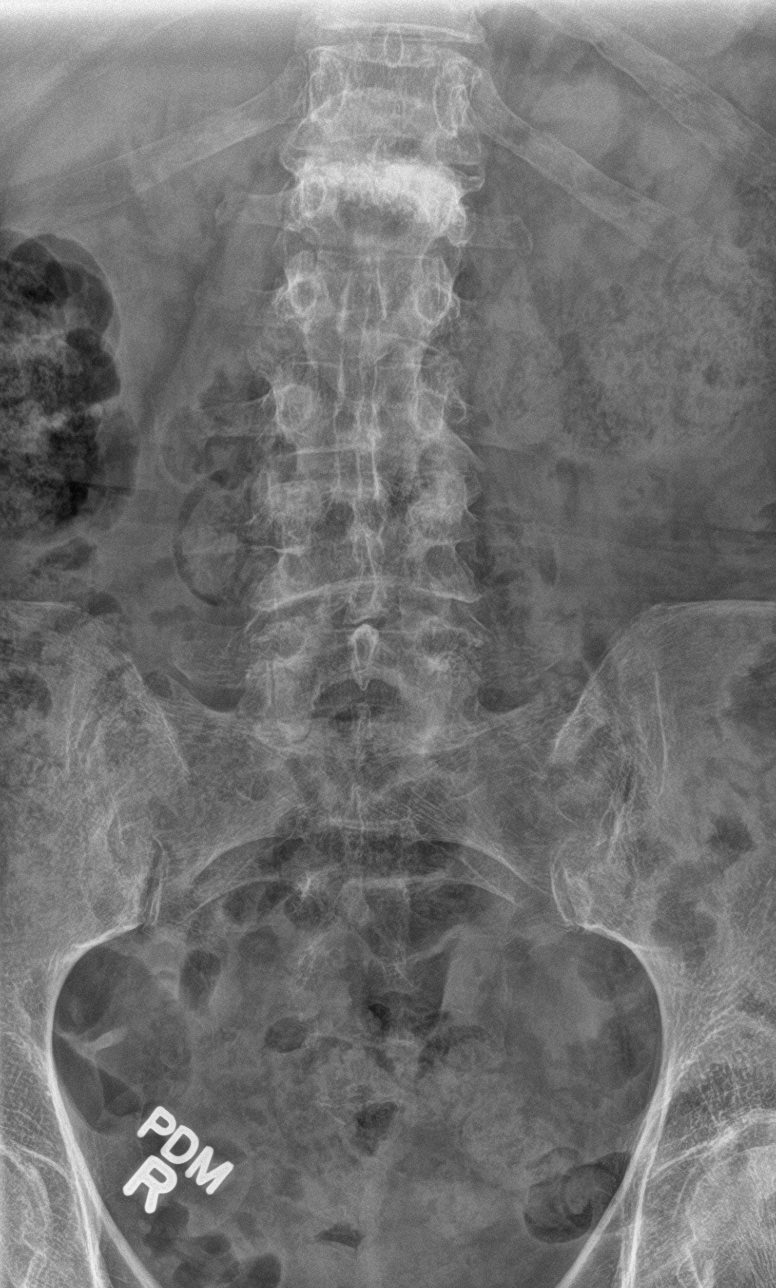

[l-spine obl (1 of 2)]
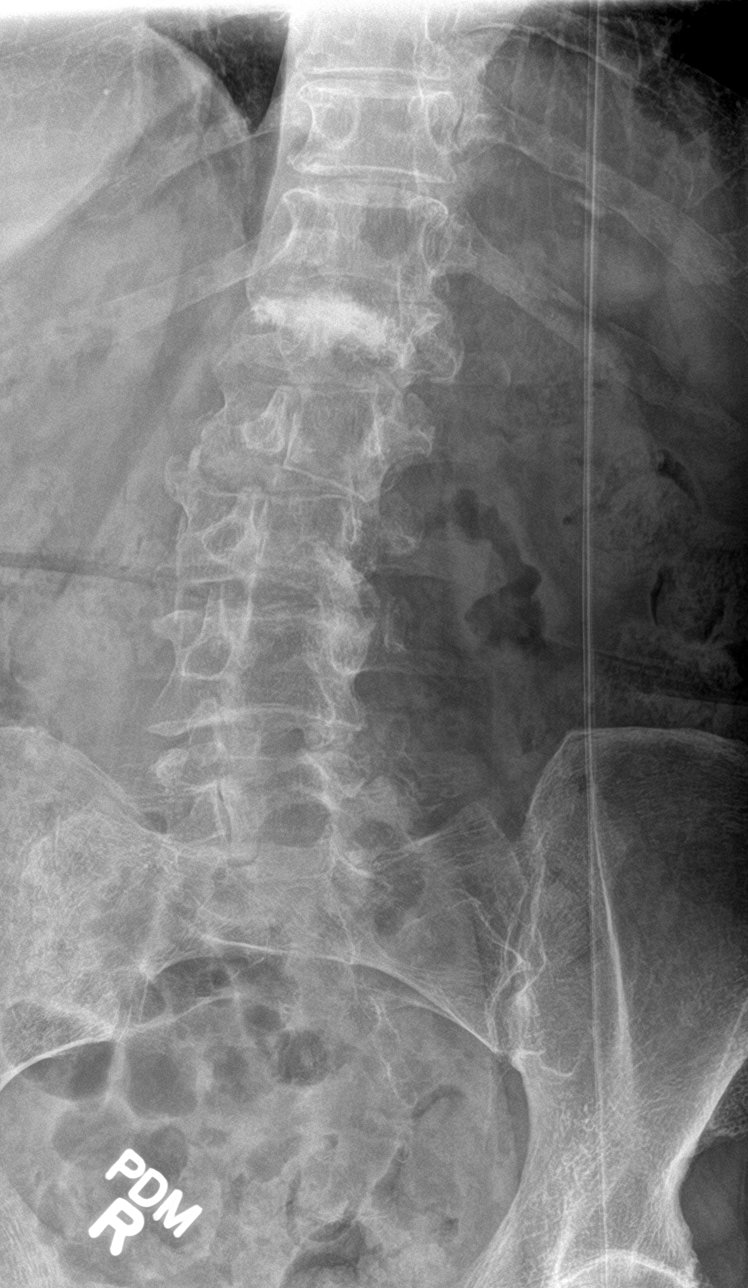

[l-spine obl (2 of 2)]
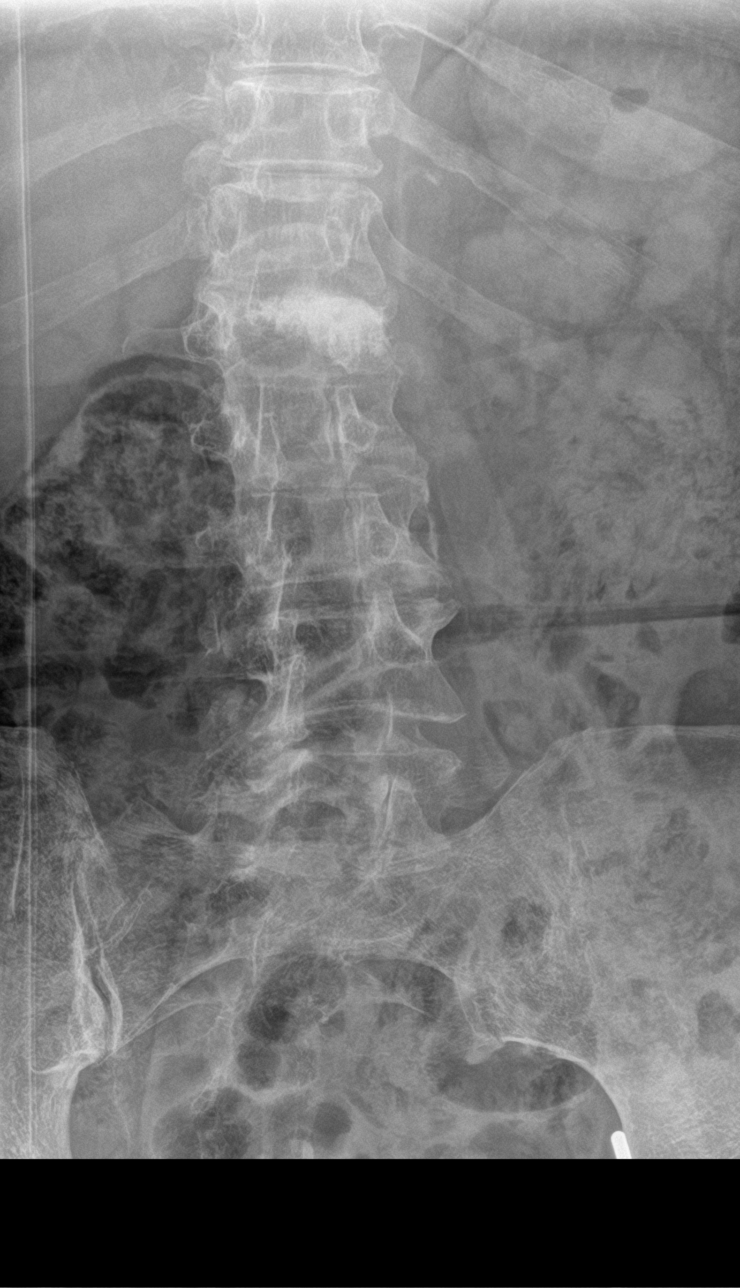

[l-spine lat]
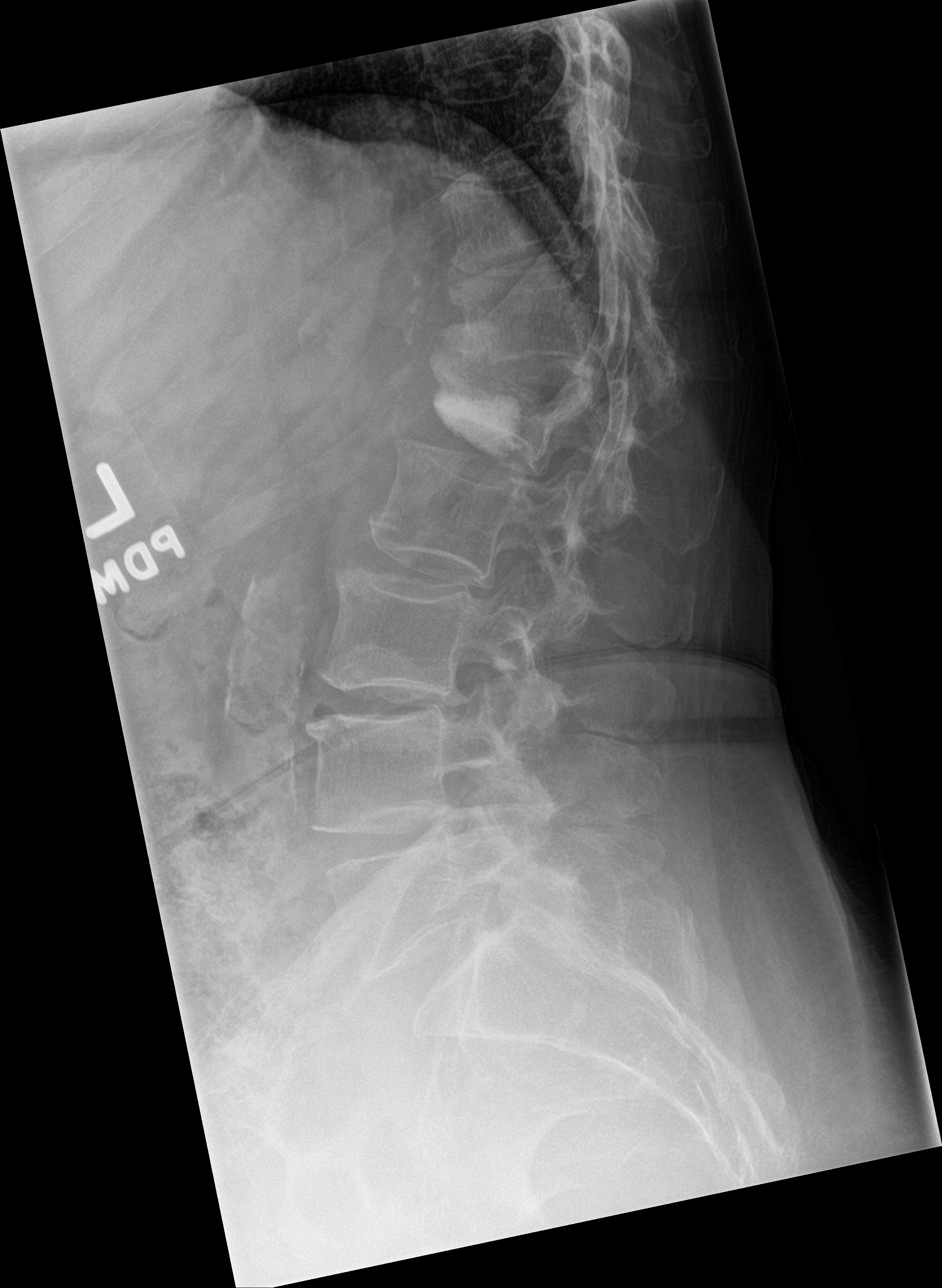

[l-spine spot]
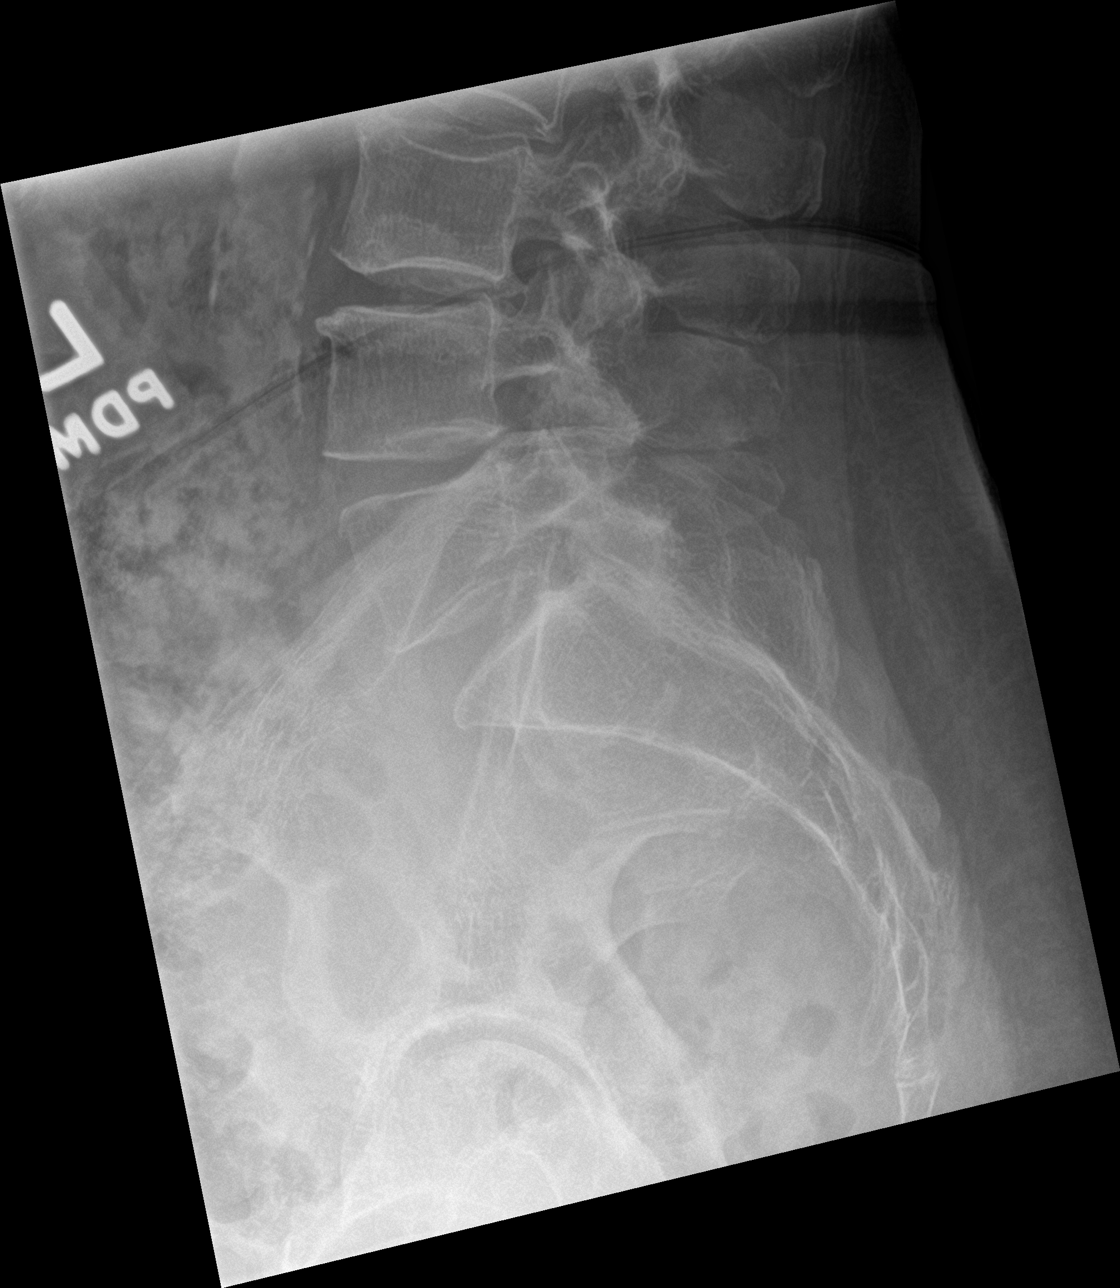

[5 of 5 positions shown; findings below may reference images not displayed]

FINDINGS: There are 5 lumbar type vertebral bodies. Patient has undergone
spinal augmentation at L1 for a fracture or which results in
approximately 50% loss of vertebral body height anteriorly. There
may be mild osseous retropulsion associated with this fracture. No
evidence of acute fracture or traumatic subluxation. There is a
grade 1 retrolisthesis at the L1-2 and L2-3 levels. There is mild
disc space narrowing and facet hypertrophy throughout the lumbar
spine. Aortic atherosclerosis noted.
IMPRESSION: Multilevel lumbar spondylosis as described. Previous spinal
augmentation at L1. No evidence of acute fracture.

## 2020-10-02 ENCOUNTER — Other Ambulatory Visit: Payer: Self-pay | Admitting: Family Medicine

## 2020-10-02 DIAGNOSIS — M545 Low back pain, unspecified: Secondary | ICD-10-CM

## 2020-10-02 DIAGNOSIS — R937 Abnormal findings on diagnostic imaging of other parts of musculoskeletal system: Secondary | ICD-10-CM

## 2020-10-02 DIAGNOSIS — R1032 Left lower quadrant pain: Secondary | ICD-10-CM

## 2020-10-15 ENCOUNTER — Other Ambulatory Visit: Payer: Self-pay

## 2020-10-15 ENCOUNTER — Ambulatory Visit (INDEPENDENT_AMBULATORY_CARE_PROVIDER_SITE_OTHER): Payer: Medicare Other

## 2020-10-15 VITALS — BP 138/70 | HR 84 | Temp 97.5°F | Ht 66.0 in | Wt 205.0 lb

## 2020-10-15 DIAGNOSIS — Z0000 Encounter for general adult medical examination without abnormal findings: Secondary | ICD-10-CM | POA: Diagnosis not present

## 2020-10-15 NOTE — Patient Instructions (Addendum)
Jillian Guerrero , Thank you for taking time to come for your Medicare Wellness Visit. I appreciate your ongoing commitment to your health goals. Please review the following plan we discussed and let me know if I can assist you in the future.   Screening recommendations/referrals: Colonoscopy: 05/31/2013 due 2025 Mammogram: 02/25/2020 Bone Density: 06/07/2020 Recommended yearly ophthalmology/optometry visit for glaucoma screening and checkup Recommended yearly dental visit for hygiene and checkup  Vaccinations: Influenza vaccine: due in fall 2022 Pneumococcal vaccine: completed series Tdap vaccine: 01/02/2014 Shingles vaccine: will obtain local pharmacy     Advanced directives: will provide copies   Conditions/risks identified: Asthma /Cough   Next appointment: 10/17/2020 130pm Dr.Koberlein pt agrees she can wait to be evaluated on Friday . Pt was advised if symptoms worse to seek medical attention go to the ER/call 911  immediately . L.Mathilde Mcwherter,LPN    Preventive Care 41 Years and Older, Female Preventive care refers to lifestyle choices and visits with your health care provider that can promote health and wellness. What does preventive care include? A yearly physical exam. This is also called an annual well check. Dental exams once or twice a year. Routine eye exams. Ask your health care provider how often you should have your eyes checked. Personal lifestyle choices, including: Daily care of your teeth and gums. Regular physical activity. Eating a healthy diet. Avoiding tobacco and drug use. Limiting alcohol use. Practicing safe sex. Taking low-dose aspirin every day. Taking vitamin and mineral supplements as recommended by your health care provider. What happens during an annual well check? The services and screenings done by your health care provider during your annual well check will depend on your age, overall health, lifestyle risk factors, and family history of disease. Counseling   Your health care provider may ask you questions about your: Alcohol use. Tobacco use. Drug use. Emotional well-being. Home and relationship well-being. Sexual activity. Eating habits. History of falls. Memory and ability to understand (cognition). Work and work Astronomer. Reproductive health. Screening  You may have the following tests or measurements: Height, weight, and BMI. Blood pressure. Lipid and cholesterol levels. These may be checked every 5 years, or more frequently if you are over 58 years old. Skin check. Lung cancer screening. You may have this screening every year starting at age 75 if you have a 30-pack-year history of smoking and currently smoke or have quit within the past 15 years. Fecal occult blood test (FOBT) of the stool. You may have this test every year starting at age 66. Flexible sigmoidoscopy or colonoscopy. You may have a sigmoidoscopy every 5 years or a colonoscopy every 10 years starting at age 68. Hepatitis C blood test. Hepatitis B blood test. Sexually transmitted disease (STD) testing. Diabetes screening. This is done by checking your blood sugar (glucose) after you have not eaten for a while (fasting). You may have this done every 1-3 years. Bone density scan. This is done to screen for osteoporosis. You may have this done starting at age 35. Mammogram. This may be done every 1-2 years. Talk to your health care provider about how often you should have regular mammograms. Talk with your health care provider about your test results, treatment options, and if necessary, the need for more tests. Vaccines  Your health care provider may recommend certain vaccines, such as: Influenza vaccine. This is recommended every year. Tetanus, diphtheria, and acellular pertussis (Tdap, Td) vaccine. You may need a Td booster every 10 years. Zoster vaccine. You may need this after  age 27. Pneumococcal 13-valent conjugate (PCV13) vaccine. One dose is recommended  after age 57. Pneumococcal polysaccharide (PPSV23) vaccine. One dose is recommended after age 49. Talk to your health care provider about which screenings and vaccines you need and how often you need them. This information is not intended to replace advice given to you by your health care provider. Make sure you discuss any questions you have with your health care provider. Document Released: 03/14/2015 Document Revised: 11/05/2015 Document Reviewed: 12/17/2014 Elsevier Interactive Patient Education  2017 [employer] Prevention in the Home Falls can cause injuries. They can happen to people of all ages. There are many things you can do to make your home safe and to help prevent falls. What can I do on the outside of my home? Regularly fix the edges of walkways and driveways and fix any cracks. Remove anything that might make you trip as you walk through a door, such as a raised step or threshold. Trim any bushes or trees on the path to your home. Use bright outdoor lighting. Clear any walking paths of anything that might make someone trip, such as rocks or tools. Regularly check to see if handrails are loose or broken. Make sure that both sides of any steps have handrails. Any raised decks and porches should have guardrails on the edges. Have any leaves, snow, or ice cleared regularly. Use sand or salt on walking paths during winter. Clean up any spills in your garage right away. This includes oil or grease spills. What can I do in the bathroom? Use night lights. Install grab bars by the toilet and in the tub and shower. Do not use towel bars as grab bars. Use non-skid mats or decals in the tub or shower. If you need to sit down in the shower, use a plastic, non-slip stool. Keep the floor dry. Clean up any water that spills on the floor as soon as it happens. Remove soap buildup in the tub or shower regularly. Attach bath mats securely with double-sided non-slip rug tape. Do not  have throw rugs and other things on the floor that can make you trip. What can I do in the bedroom? Use night lights. Make sure that you have a light by your bed that is easy to reach. Do not use any sheets or blankets that are too big for your bed. They should not hang down onto the floor. Have a firm chair that has side arms. You can use this for support while you get dressed. Do not have throw rugs and other things on the floor that can make you trip. What can I do in the kitchen? Clean up any spills right away. Avoid walking on wet floors. Keep items that you use a lot in easy-to-reach places. If you need to reach something above you, use a strong step stool that has a grab bar. Keep electrical cords out of the way. Do not use floor polish or wax that makes floors slippery. If you must use wax, use non-skid floor wax. Do not have throw rugs and other things on the floor that can make you trip. What can I do with my stairs? Do not leave any items on the stairs. Make sure that there are handrails on both sides of the stairs and use them. Fix handrails that are broken or loose. Make sure that handrails are as long as the stairways. Check any carpeting to make sure that it is firmly attached to the stairs.  Fix any carpet that is loose or worn. Avoid having throw rugs at the top or bottom of the stairs. If you do have throw rugs, attach them to the floor with carpet tape. Make sure that you have a light switch at the top of the stairs and the bottom of the stairs. If you do not have them, ask someone to add them for you. What else can I do to help prevent falls? Wear shoes that: Do not have high heels. Have rubber bottoms. Are comfortable and fit you well. Are closed at the toe. Do not wear sandals. If you use a stepladder: Make sure that it is fully opened. Do not climb a closed stepladder. Make sure that both sides of the stepladder are locked into place. Ask someone to hold it for  you, if possible. Clearly mark and make sure that you can see: Any grab bars or handrails. First and last steps. Where the edge of each step is. Use tools that help you move around (mobility aids) if they are needed. These include: Canes. Walkers. Scooters. Crutches. Turn on the lights when you go into a dark area. Replace any light bulbs as soon as they burn out. Set up your furniture so you have a clear path. Avoid moving your furniture around. If any of your floors are uneven, fix them. If there are any pets around you, be aware of where they are. Review your medicines with your doctor. Some medicines can make you feel dizzy. This can increase your chance of falling. Ask your doctor what other things that you can do to help prevent falls. This information is not intended to replace advice given to you by your health care provider. Make sure you discuss any questions you have with your health care provider. Document Released: 12/12/2008 Document Revised: 07/24/2015 Document Reviewed: 03/22/2014 Elsevier Interactive Patient Education  2017 [employer].

## 2020-10-15 NOTE — Progress Notes (Signed)
Subjective:   Jillian Guerrero is a 75 y.o. female who presents for Medicare Annual (Subsequent) preventive examination.  Review of Systems    N/A       Objective:    There were no vitals filed for this visit. There is no height or weight on file to calculate BMI.  Advanced Directives 05/08/2020  Does Patient Have a Medical Advance Directive? Yes  Type of Estate agent of [place];Living will;Out of facility DNR (pink MOST or yellow form)    Current Medications (verified) Outpatient Encounter Medications as of 10/15/2020  Medication Sig   alendronate (FOSAMAX) 70 MG tablet Take 1 tablet (70 mg total) by mouth every 7 (seven) days. Take with a full glass of water on an empty stomach.   atorvastatin (LIPITOR) 10 MG tablet TAKE 1 TABLET EVERY DAY   calcium citrate-vitamin D (CITRACAL+D) 315-200 MG-UNIT tablet Take 1 tablet by mouth 2 (two) times daily.   cetirizine (ZYRTEC) 10 MG tablet Take 10 mg by mouth daily.   gabapentin (NEURONTIN) 600 MG tablet TAKE 1 TABLET TWICE DAILY   pantoprazole (PROTONIX) 40 MG tablet TAKE 1 TABLET EVERY DAY   No facility-administered encounter medications on file as of 10/15/2020.    Allergies (verified) Collagen   History: Past Medical History:  Diagnosis Date   GERD (gastroesophageal reflux disease)    Hyperlipidemia    Lumbar compression fracture (HCC)    Osteoporosis    Peripheral neuropathy    Past Surgical History:  Procedure Laterality Date   APPENDECTOMY  1956   BLADDER SUSPENSION  2003   BREAST BIOPSY  2015   hyaluronic acid viscosupplementation Bilateral    knees - 01/16/2018 x2   KYPHOPLASTY  10/2018   TONSILLECTOMY  1964   UPPER GI ENDOSCOPY  03/08/2019   gastritis   Family History  Problem Relation Age of Onset   Arthritis Mother    Heart disease Mother    Arthritis Father    Depression Father    Arthritis Brother    High blood pressure Brother    High Cholesterol Brother    Arthritis  Maternal Grandfather    Asthma Paternal Grandmother    Depression Paternal Grandmother    Heart disease Paternal Grandmother    High Cholesterol Paternal Grandmother    Arthritis Son    Heart attack Son    High Cholesterol Son    Arthritis Daughter    Social History   Socioeconomic History   Marital status: Widowed    Spouse name: Not on file   Number of children: Not on file   Years of education: Not on file   Highest education level: Not on file  Occupational History   Not on file  Tobacco Use   Smoking status: Former   Smokeless tobacco: Never  Vaping Use   Vaping Use: Never used  Substance and Sexual Activity   Alcohol use: Never   Drug use: Never   Sexual activity: Not on file  Other Topics Concern   Not on file  Social History Narrative   Right handed    Lives alone    Social Determinants of Health   Financial Resource Strain: Not on file  Food Insecurity: Not on file  Transportation Needs: Not on file  Physical Activity: Not on file  Stress: Not on file  Social Connections: Not on file    Tobacco Counseling Counseling given: Not Answered   Clinical Intake:  Diabetic?no         Activities of Daily Living No flowsheet data found.  Patient Care Team: Wynn Banker, MD as PCP - General (Family Medicine) Glendale Chard, DO as Consulting Physician (Neurology)  Indicate any recent Medical Services you may have received from other than [facility] providers in the past year (date may be approximate).     Assessment:   This is a routine wellness examination for Jillian Guerrero.  Hearing/Vision screen No results found.  Dietary issues and exercise activities discussed:     Goals Addressed   None    Depression Screen PHQ 2/9 Scores 09/26/2020  PHQ - 2 Score 0  PHQ- 9 Score 0    Fall Risk Fall Risk  05/08/2020  Falls in the past year? 0  Number falls in past yr: 0  Injury with Fall? 0    FALL RISK PREVENTION  PERTAINING TO THE HOME:  Any stairs in or around the home? Yes  If so, are there any without handrails? No  Home free of loose throw rugs in walkways, pet beds, electrical cords, etc? Yes  Adequate lighting in your home to reduce risk of falls? Yes   ASSISTIVE DEVICES UTILIZED TO PREVENT FALLS:  Life alert? No  Use of a cane, walker or w/c? No  Grab bars in the bathroom? Yes  Shower chair or bench in shower? Yes  Elevated toilet seat or a handicapped toilet? Yes   TIMED UP AND GO:  Was the test performed? Yes .  Length of time to ambulate 10 feet: 10 sec.   Gait steady and fast without use of assistive device  Cognitive Function:  Normal cognitive status assessed by direct observation by this Nurse Health Advisor. No abnormalities found.        Immunizations Immunization History  Administered Date(s) Administered   Hepatitis A 01/02/2014   Influenza-Unspecified 02/22/2020   Moderna Sars-Covid-2 Vaccination 06/12/2019, 07/11/2019, 04/08/2020   Pneumococcal Conjugate-13 12/18/2013   Pneumococcal Polysaccharide-23 09/12/2012   Td 11/08/2013   Tdap 01/02/2014   Typhoid Inactivated 01/02/2014   Yellow Fever 01/02/2014    TDAP status: Up to date  Flu Vaccine status: Up to date  Pneumococcal vaccine status: Up to date  Covid-19 vaccine status: Completed vaccines  Qualifies for Shingles Vaccine? Yes   Zostavax completed No   Shingrix Completed?: No.    Education has been provided regarding the importance of this vaccine. Patient has been advised to call insurance company to determine out of pocket expense if they have not yet received this vaccine. Advised may also receive vaccine at local pharmacy or Health Dept. Verbalized acceptance and understanding.  Screening Tests Health Maintenance  Topic Date Due   COVID-19 Vaccine (4 - Booster for Moderna series) 08/06/2020   INFLUENZA VACCINE  09/29/2020   Zoster Vaccines- Shingrix (1 of 2) 12/27/2020 (Originally  12/04/1994)   COLONOSCOPY (Pts 45-8yrs Insurance coverage will need to be confirmed)  06/01/2023   TETANUS/TDAP  01/03/2024   DEXA SCAN  Completed   Hepatitis C Screening  Completed   PNA vac Low Risk Adult  Completed   HPV VACCINES  Aged Out    Health Maintenance  Health Maintenance Due  Topic Date Due   COVID-19 Vaccine (4 - Booster for Moderna series) 08/06/2020   INFLUENZA VACCINE  09/29/2020    Colorectal cancer screening: Type of screening: Colonoscopy. Completed 05/31/2013. Repeat every 10 years  Mammogram status: No longer required due to age.  Bone Density status: Completed  06/07/2020. Results reflect: Bone density results: OSTEOPOROSIS. Repeat every 2 years.  Lung Cancer Screening: (Low Dose CT Chest recommended if Age 26-80 years, 30 pack-year currently smoking OR have quit w/in 15years.) does not qualify.   Lung Cancer Screening Referral: n/a  Additional Screening:  Hepatitis C Screening: does not qualify; Completed 11/30/2019  Vision Screening: Recommended annual ophthalmology exams for early detection of glaucoma and other disorders of the eye. Is the patient up to date with their annual eye exam?  Yes  Who is the provider or what is the name of the office in which the patient attends annual eye exams? Dr.Pecan  If pt is not established with a provider, would they like to be referred to a provider to establish care? No .   Dental Screening: Recommended annual dental exams for proper oral hygiene  Community Resource Referral / Chronic Care Management: CRR required this visit?  No   CCM required this visit?  No      Plan:     I have personally reviewed and noted the following in the patient's chart:   Medical and social history Use of alcohol, tobacco or illicit drugs  Current medications and supplements including opioid prescriptions.  Functional ability and status Nutritional status Physical activity Advanced directives List of other  physicians Hospitalizations, surgeries, and ER visits in previous 12 months Vitals Screenings to include cognitive, depression, and falls Referrals and appointments  In addition, I have reviewed and discussed with patient certain preventive protocols, quality metrics, and best practice recommendations. A written personalized care plan for preventive services as well as general preventive health recommendations were provided to patient.     March Rummage, LPN   8/56/3149   Nurse Notes: Conditions/risks identified: Asthma /Cough   Next appointment: 10/17/2020 130pm Dr.Koberlein pt agrees she can wait to be evaluated on Friday 10/17/2020. Pt was advised if symptoms worse to seek medical attention go to the ER /call 911 immediately . L.Ladanian Kelter,LPN

## 2020-10-16 ENCOUNTER — Other Ambulatory Visit: Payer: Self-pay

## 2020-10-17 ENCOUNTER — Encounter: Payer: Self-pay | Admitting: Family Medicine

## 2020-10-17 ENCOUNTER — Ambulatory Visit (INDEPENDENT_AMBULATORY_CARE_PROVIDER_SITE_OTHER): Payer: Medicare Other | Admitting: Family Medicine

## 2020-10-17 VITALS — BP 120/78 | HR 73 | Temp 97.8°F | Ht 66.0 in | Wt 204.3 lb

## 2020-10-17 DIAGNOSIS — J42 Unspecified chronic bronchitis: Secondary | ICD-10-CM

## 2020-10-17 DIAGNOSIS — R059 Cough, unspecified: Secondary | ICD-10-CM | POA: Diagnosis not present

## 2020-10-17 DIAGNOSIS — I8393 Asymptomatic varicose veins of bilateral lower extremities: Secondary | ICD-10-CM | POA: Diagnosis not present

## 2020-10-17 DIAGNOSIS — H9193 Unspecified hearing loss, bilateral: Secondary | ICD-10-CM | POA: Diagnosis not present

## 2020-10-17 MED ORDER — VALACYCLOVIR HCL 1 G PO TABS: 2000.0000 mg | ORAL_TABLET | Freq: Two times a day (BID) | ORAL | 5 refills | Status: AC

## 2020-10-17 NOTE — Patient Instructions (Signed)
*  restart the advair *ok to continue with anti-histamine *consider flonase nasal spray  You will get a call from pulmonology for appointment and audiology  I will let you know about MRI results when I get them

## 2020-10-17 NOTE — Progress Notes (Signed)
Jillian Guerrero DOB: 12-14-1945 Encounter date: 10/17/2020  This is a 75 y.o. female who presents with Chief Complaint  Patient presents with   Cough    Non-productive x1 week   Wheezing    X1 week    History of present illness:  Was here on weds for wellness visit and was telling nurse she was coughing a lot due to PND and reflux. Daughter then told her that she was wheezing - this is happening with taking steps up, or doing something. Not wheezing at bed. Was using generic advair when she was doing house renovations and did great, but then stopped. Feels that asthma has come back. She would like to see pulmonary doctor. She has been prone to pneumonia and been dx with bronchitis in past.   Dx with asthma 25 yrs ago- couldn't breathe, sleep. Saw pulm and at that time was dx with acid reflux. For about 10 years was on regular medication - advair - and then felt like asthma was gone. Did stop smoking 18-19 years ago and things were better. Was seeing a pulmonologist (in [place]) on regular basis. Would just get resp infections regularly.   With coming here things have changed.  Doesn't like nasal sprays.   Notes she can't make out specifics of things that people are saying. Even on the phone she is having more difficult.   She has MRI set up for back soon. Everything hurts in her legs. When she sits down she feel pain going down her legs. Hard to get comfortable at night; hurts on hips; hurts on both sides; just can't find position without pain. Hard to get up from chair. Low back pain to hips down to knees. Takes time to get started walking.   Varicosities of legs bilat. Not sure if this is related to what is going on with pain in back/hips/legs.    Allergies  Allergen Reactions   Collagen     Redness, swelling   Current Meds  Medication Sig   alendronate (FOSAMAX) 70 MG tablet Take 1 tablet (70 mg total) by mouth every 7 (seven) days. Take with a full glass of water on an empty  stomach.   atorvastatin (LIPITOR) 10 MG tablet TAKE 1 TABLET EVERY DAY   calcium citrate-vitamin D (CITRACAL+D) 315-200 MG-UNIT tablet Take 1 tablet by mouth 2 (two) times daily.   cetirizine (ZYRTEC) 10 MG tablet Take 10 mg by mouth daily.   gabapentin (NEURONTIN) 600 MG tablet TAKE 1 TABLET TWICE DAILY   pantoprazole (PROTONIX) 40 MG tablet TAKE 1 TABLET EVERY DAY   valACYclovir (VALTREX) 1000 MG tablet Take 2 tablets (2,000 mg total) by mouth 2 (two) times daily for 20 days.    Review of Systems  Constitutional:  Negative for chills, fatigue and fever.  Respiratory:  Negative for cough, chest tightness, shortness of breath and wheezing.   Cardiovascular:  Negative for chest pain, palpitations and leg swelling.  Musculoskeletal:  Positive for back pain.  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 Valtrex works well.    Objective:  BP 120/78 (BP Location: Left Arm, Patient Position: Sitting, Cuff Size: Large)   Pulse 73   Temp 97.8 F (36.6 C) (Oral)   Ht 5\' 6"  (1.676 m)   Wt 204 lb 4.8 oz (92.7 kg)   SpO2 98%   BMI 32.97 kg/m   Weight: 204 lb 4.8 oz (92.7 kg)   BP Readings from Last 3  Encounters:  10/17/20 120/78  10/15/20 138/70  09/26/20 128/82   Wt Readings from Last 3 Encounters:  10/17/20 204 lb 4.8 oz (92.7 kg)  10/15/20 205 lb (93 kg)  09/26/20 203 lb 4.8 oz (92.2 kg)    Physical Exam Constitutional:      General: She is not in acute distress.    Appearance: She is well-developed.  Cardiovascular:     Rate and Rhythm: Normal rate and regular rhythm.     Heart sounds: Normal heart sounds. No murmur heard.   No friction rub.  Pulmonary:     Effort: Pulmonary effort is normal. No respiratory distress.     Breath sounds: Normal breath sounds. No wheezing or rales.  Musculoskeletal:     Right lower leg: No edema.     Left lower leg: No edema.  Neurological:     Mental Status: She is alert and oriented to person, place, and time.  Psychiatric:         Behavior: Behavior normal.    Assessment/Plan 1. Cough Restart advair; would like to refer to pulmonology - Ambulatory referral to Pulmonology  2. Varicose veins of both lower extremities, unspecified whether complicated We are going to see how MRI looks; consider vascular referral pending this result.   3. Chronic bronchitis, unspecified chronic bronchitis type (HCC) See above. Continue with allergy medication.  - Ambulatory referral to Pulmonology  4. Bilateral hearing loss, unspecified hearing loss type Hearing Screening   500Hz  1000Hz  2000Hz  4000Hz   Right ear Pass Pass Pass Pass  Left ear Pass Pass Pass Pass    - Ambulatory referral to Audiology for more formal evaluation.    Return for pending imaging.      , MD

## 2020-10-20 ENCOUNTER — Ambulatory Visit
Admission: RE | Admit: 2020-10-20 | Discharge: 2020-10-20 | Disposition: A | Discharge status: Home / Self Care | Payer: Medicare Other | Source: Ambulatory Visit | Attending: Family Medicine | Admitting: Family Medicine

## 2020-10-20 ENCOUNTER — Other Ambulatory Visit: Payer: Self-pay

## 2020-10-20 DIAGNOSIS — R1032 Left lower quadrant pain: Secondary | ICD-10-CM

## 2020-10-20 DIAGNOSIS — R937 Abnormal findings on diagnostic imaging of other parts of musculoskeletal system: Secondary | ICD-10-CM

## 2020-10-20 DIAGNOSIS — M545 Low back pain, unspecified: Secondary | ICD-10-CM

## 2020-10-20 IMAGING — MR MR LUMBAR SPINE W/O CM
4 of 5 series · 18 of 48 positions shown · non-contrast
Comparison: No prior MRI is available for comparison, correlation
is made with lumbar spine radiographs [DATE]

CLINICAL DATA: Low back pain, with leg pain and numbness

EXAM:
MRI LUMBAR SPINE WITHOUT CONTRAST
TECHNIQUE: Multiplanar, multisequence MR imaging of the lumbar spine was
performed. No intravenous contrast was administered.

[Series 6: T2 · sagittal · 4.0mm · 0.73mm/px · 6 of 15 slices shown (1 of 2)]
[im 1/15]
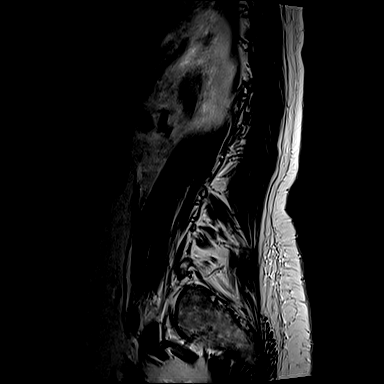
[im 3/15]
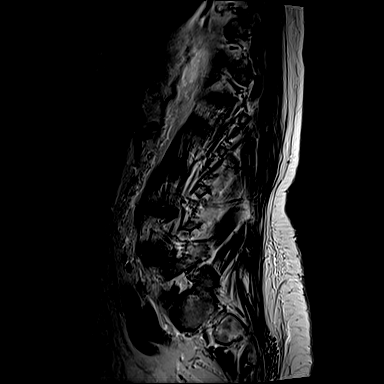
[im 6/15]
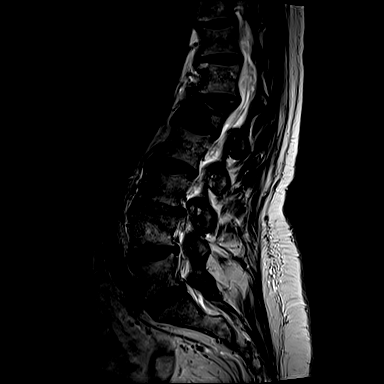
[im 9/15]
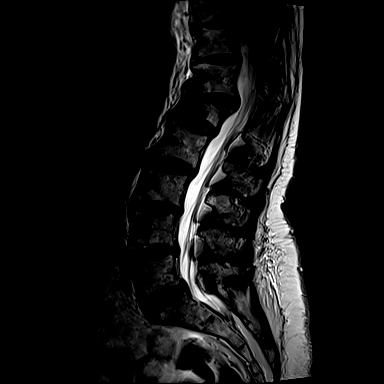
[im 12/15]
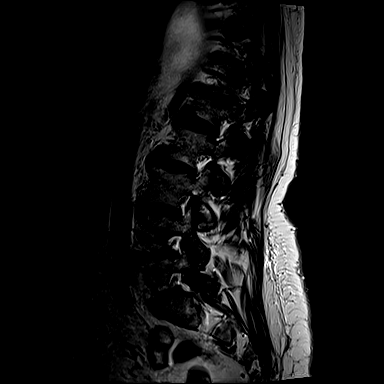
[im 15/15]
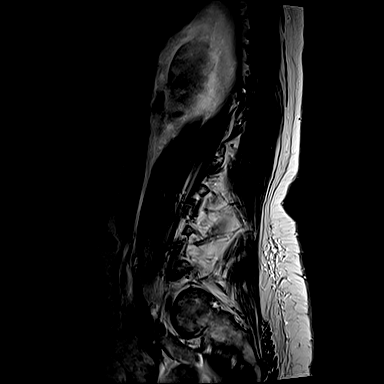

[Series 7: T1 · sagittal · 4.0mm · 0.73mm/px · 3 of 15 slices shown (1 of 2)]
[im 3/15]
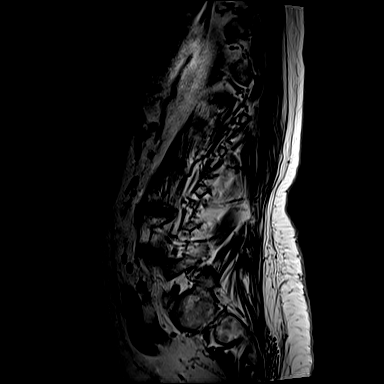
[im 9/15]
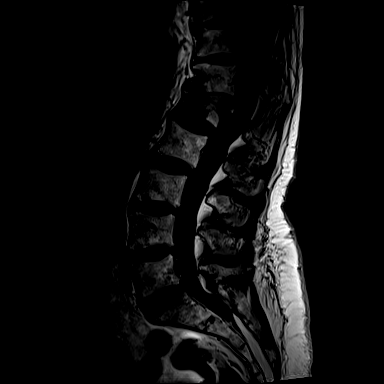
[im 15/15]
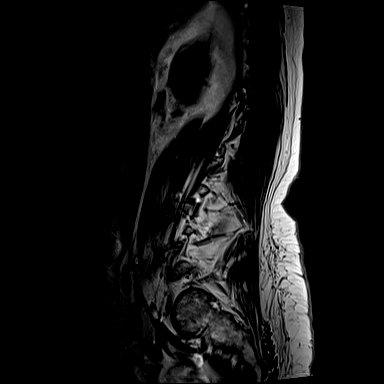

[Series 11: T2 · axial · 4.0mm · 0.28mm/px · z∈[-140,+45]mm · 6 of 39 slices shown (2 of 2)]
[im 1/39]
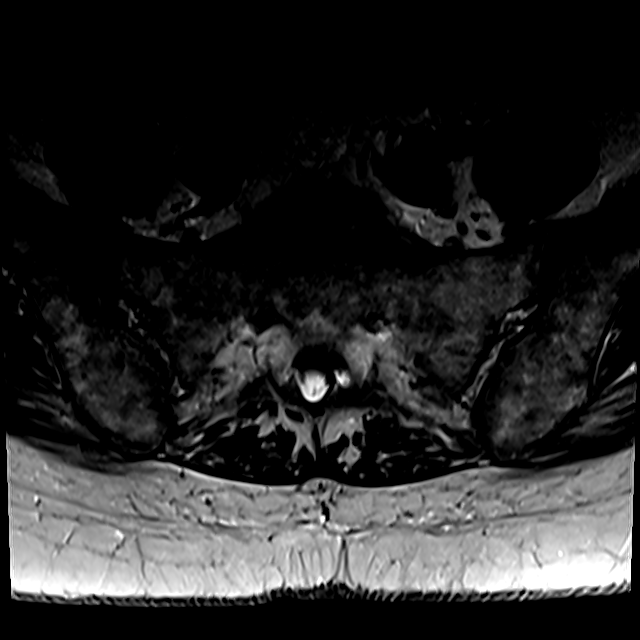
[im 6/39]
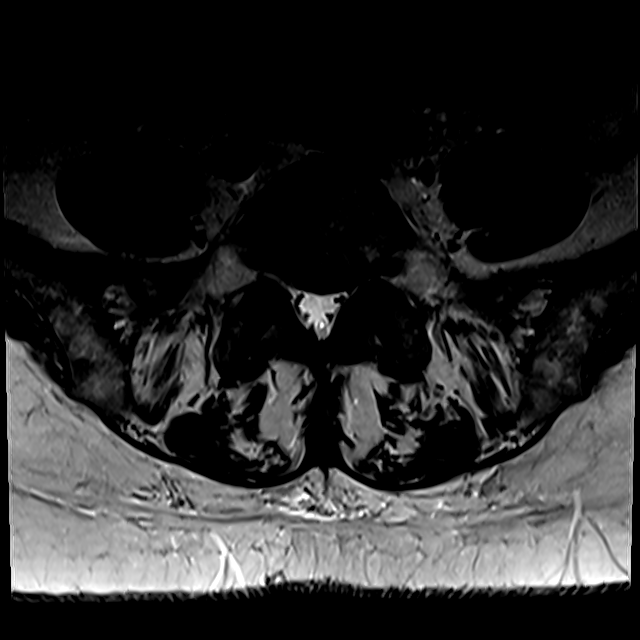
[im 11/39]
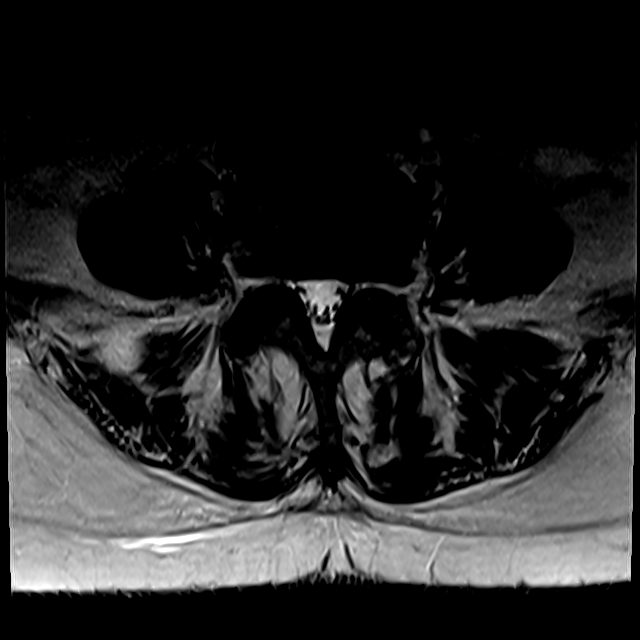
[im 17/39]
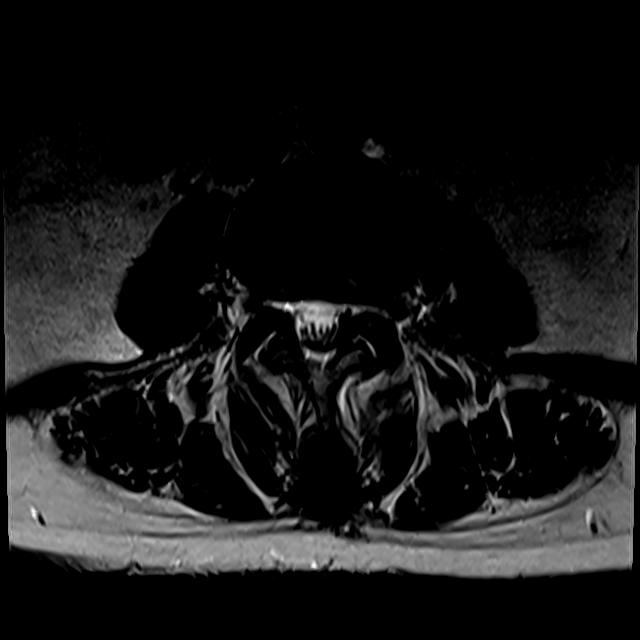
[im 20/39]
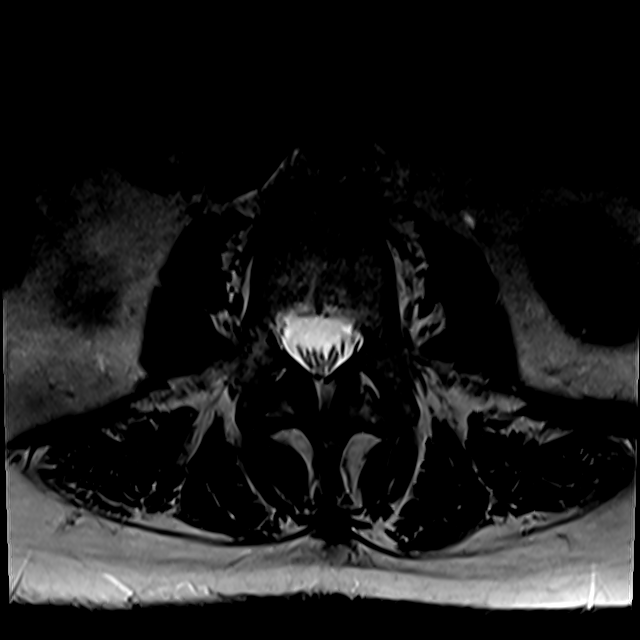
[im 33/39]
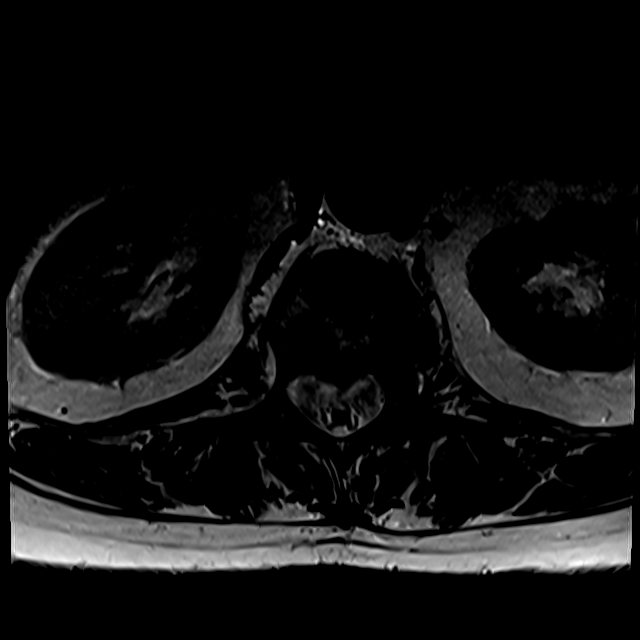

[Series 100: T1 · axial · 4.0mm · 0.28mm/px · z∈[-115,+45]mm · 3 of 39 slices shown (2 of 2)]
[im 6/39]
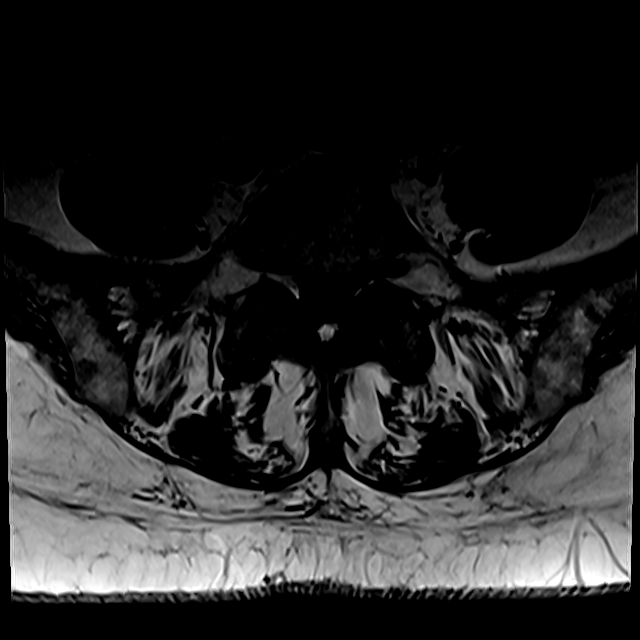
[im 20/39]
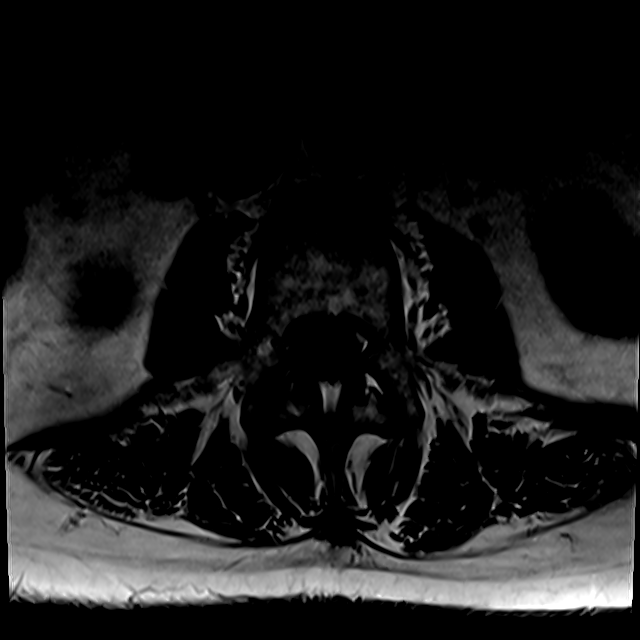
[im 33/39]
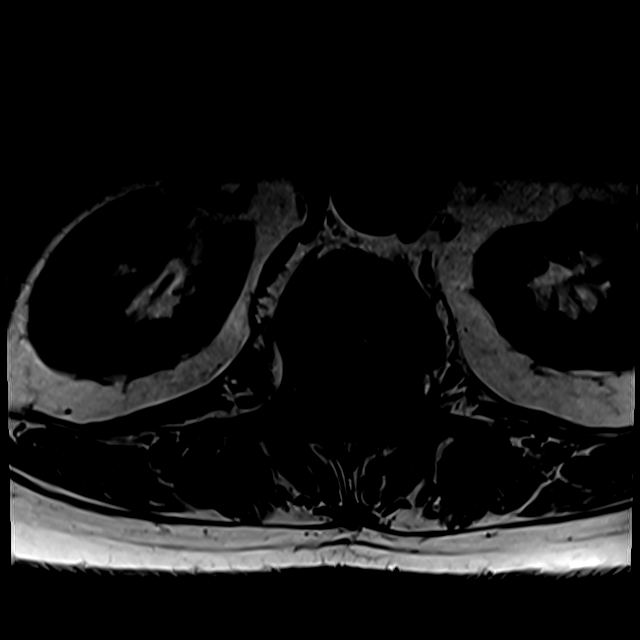

[18 of 48 positions shown; findings below may reference images not displayed]

FINDINGS: Segmentation:  Standard.

Alignment: Focal kyphosis at the thoracolumbar junction, secondary
to L1 compression fracture, with moderate retrolisthesis of T12 on
L1.

Vertebrae: L1 vertebral body compression fracture with retropulsion
of the superior posterior aspect, status post kyphoplasty, unchanged
compared to [DATE]. Increased T2 signal at the superior
posterior aspect of L[DATE] indicate a more acute component of the
fracture. In the anterior inferior aspect of L4, there is a T2
heterogeneously hyperintense lesion, most likely a benign
hemangioma.

Conus medullaris and cauda equina: Conus extends to the L1 level.
Conus and cauda equina appear normal.

Paraspinal and other soft tissues: Negative.

Disc levels:

T12-L1: Retropulsion of the superior posterior aspect of L1, which
narrows the spinal canal, but does not appear to cause cord
compression. Moderate facet arthropathy. No neural foraminal
narrowing.

L1-L2: No significant disc bulge. Mild facet arthropathy. No spinal
canal stenosis or neural foraminal narrowing.

L2-L3: Mild disc bulge. Mild facet arthropathy. No spinal canal
stenosis no neural foraminal narrowing. Mild disc bulge. Mild facet
arthropathy. No spinal canal stenosis. No neural foraminal
narrowing.

L3-L4: Broad-based disc bulge. Mild-to-moderate facet arthropathy.
No spinal canal stenosis. No neural foraminal narrowing.

L4-L5: Mild disc bulge. Moderate facet arthropathy. No spinal canal
stenosis. No neural foraminal narrowing.

L5-S1: No significant disc bulge. No spinal canal stenosis or neural
foraminal narrowing.
IMPRESSION: Status post remote L1 compression deformity and kyphoplasty, with
increased T2 signal in the superior posterior aspect of L1,
concerning for more acute fracture component, although no prior MRI
is available for comparison. Although this causes narrowing of the
thecal sac, there is no evidence of cord compression.

## 2020-10-23 ENCOUNTER — Other Ambulatory Visit: Payer: Self-pay | Admitting: *Deleted

## 2020-10-23 DIAGNOSIS — R937 Abnormal findings on diagnostic imaging of other parts of musculoskeletal system: Secondary | ICD-10-CM

## 2020-10-23 DIAGNOSIS — R1032 Left lower quadrant pain: Secondary | ICD-10-CM

## 2020-10-23 DIAGNOSIS — M545 Low back pain, unspecified: Secondary | ICD-10-CM

## 2020-11-03 ENCOUNTER — Other Ambulatory Visit: Payer: Self-pay | Admitting: Family Medicine

## 2020-11-27 ENCOUNTER — Encounter: Payer: Self-pay | Admitting: Emergency Medicine

## 2020-11-27 ENCOUNTER — Ambulatory Visit (INDEPENDENT_AMBULATORY_CARE_PROVIDER_SITE_OTHER): Payer: Medicare Other | Admitting: Emergency Medicine

## 2020-11-27 ENCOUNTER — Other Ambulatory Visit: Payer: Self-pay

## 2020-11-27 ENCOUNTER — Ambulatory Visit (INDEPENDENT_AMBULATORY_CARE_PROVIDER_SITE_OTHER): Payer: Medicare Other

## 2020-11-27 DIAGNOSIS — R053 Chronic cough: Secondary | ICD-10-CM | POA: Insufficient documentation

## 2020-11-27 IMAGING — DX DG CHEST 2V
2 series · 2 of 2 positions shown · non-contrast
Comparison: [DATE]

CLINICAL DATA: Chronic cough

EXAM:
CHEST - 2 VIEW

[chest pa]
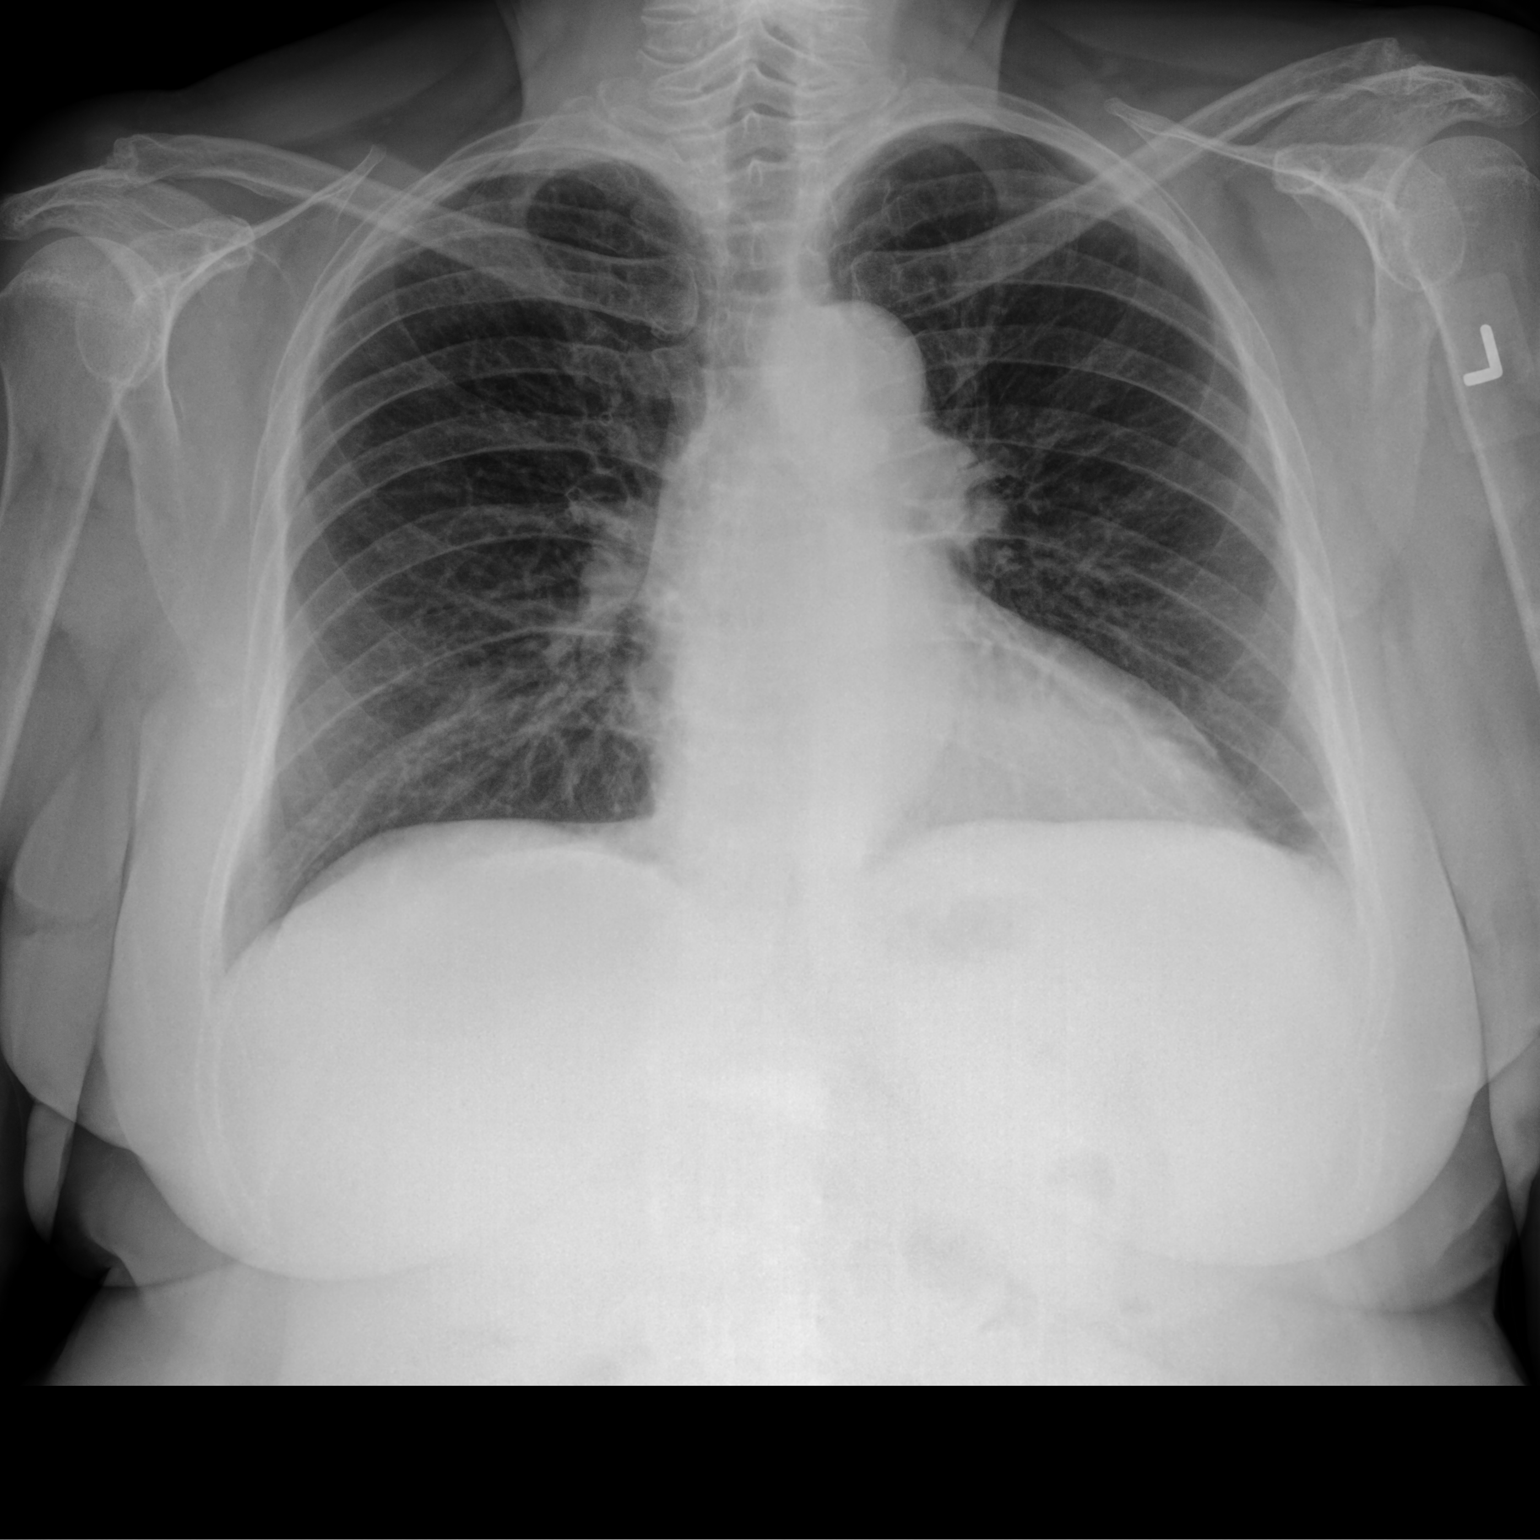

[chest lat]
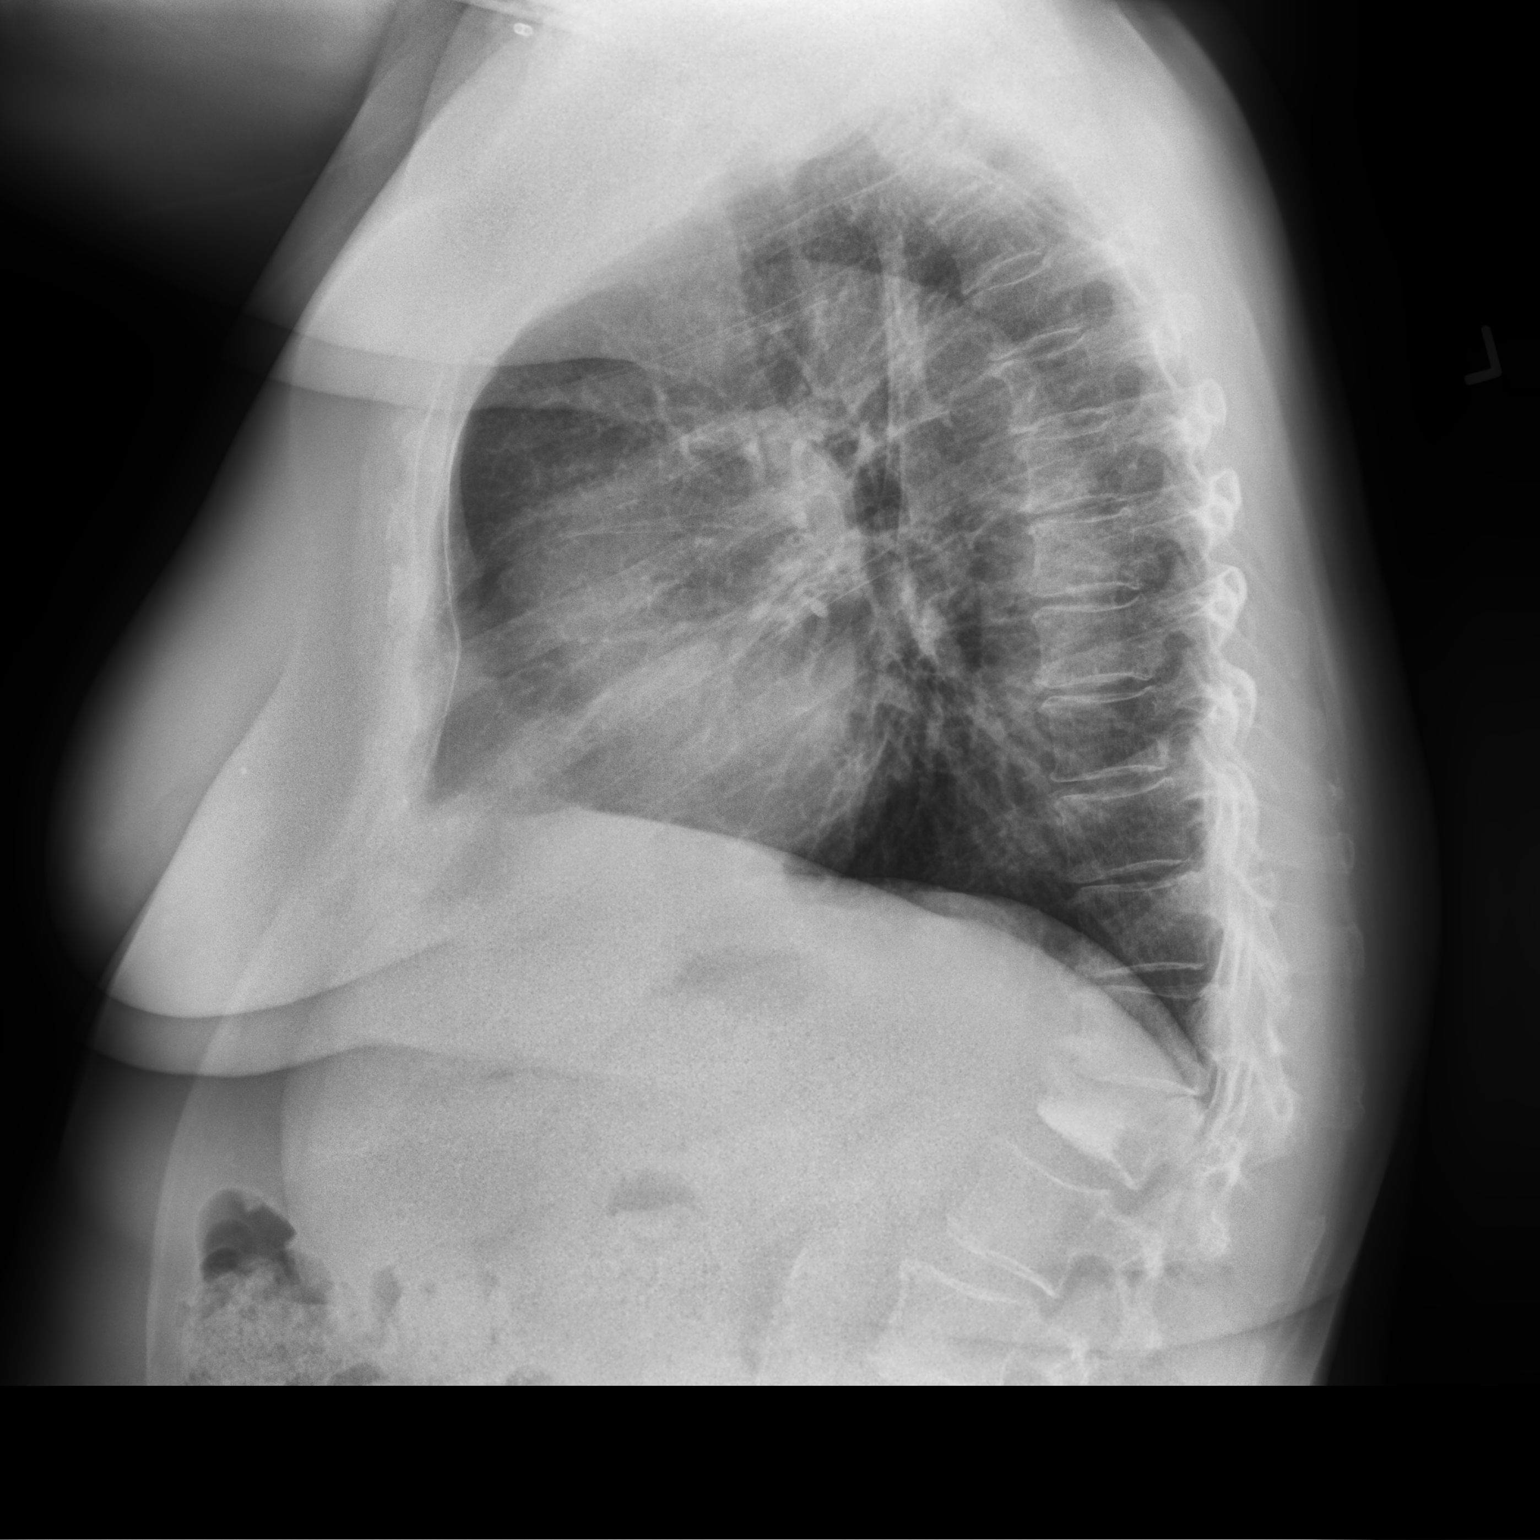

[2 of 2 positions shown; findings below may reference images not displayed]

FINDINGS: Cardiac shadow is within normal limits. The lungs are well aerated
bilaterally. No focal infiltrate or sizable effusion is seen.
Degenerative changes of the thoracic spine are noted. Changes of
prior vertebral augmentation are again noted and stable.
IMPRESSION: No acute abnormality noted.

## 2020-11-27 NOTE — Assessment & Plan Note (Signed)
This is her principal symptom, has been correlated with GERD, also chronic rhinitis.  She is better when both are treated.  She had more flaring symptoms when she came to [place].  Advair was started, symptoms of still been up and down.  Her cough is improved at least for now.  She needs pulmonary function testing to quantify her degree of obstruction.  Certainly she is at risk for COPD given her tobacco history, also asthma which was diagnosed in [place] when she did have PFT.  Depending on her degree of obstruction we will decide what medications may be helpful.  We will not restart Advair for now.  Continue the PPI and Zyrtec.  Continue your pantoprazole as you have been taking it. Continue Zyrtec as you have been taking it. Do not restart Advair for now. We will perform pulmonary function testing in next office visit. We will perform a chest x-ray today. Follow with Dr. Delton Coombes so that we can review your PFT and determine whether any medication adjustments are necessary.

## 2020-11-27 NOTE — Addendum Note (Signed)
Addended by: Dorisann Frames R on: 11/27/2020 05:05 PM   Modules accepted: Orders

## 2020-11-27 NOTE — Progress Notes (Signed)
Subjective:    Patient ID: Jillian Guerrero, female    DOB: November 17, 1945, 75 y.o.   MRN: [id number]  HPI 75 year old woman with a history of former tobacco use (40 pack years), hyperlipidemia, GERD, peripheral neuropathy, vocal cord polyps, possible asthma.  She is here today to discuss suspected obstructive lung disease.  She has a hx frequent bronchitis all of her life. Has dealt with chronic cough, was particularly bad in late 90's and she saw Pulm. Was started on PPI with improvement in her cough, also zyrtec. No more congestion or drainage. There was question of asthma in [place] - eventually able to come off inhaled meds after treating the above and stopping smoking.  Her cough and UA noise returned when she moved to [facility] and was exposed to construction and dust in her new house. She was restarted on Advair here about 1 yr ago. Cough has been up and down.    Review of Systems As per HPI   Past Medical History:  Diagnosis Date   GERD (gastroesophageal reflux disease)    Hyperlipidemia    Lumbar compression fracture (HCC)    Osteoporosis    Peripheral neuropathy      Family History  Problem Relation Age of Onset   Arthritis Mother    Heart disease Mother    Arthritis Father    Depression Father    Arthritis Brother    High blood pressure Brother    High Cholesterol Brother    Arthritis Maternal Grandfather    Asthma Paternal Grandmother    Depression Paternal Grandmother    Heart disease Paternal Grandmother    High Cholesterol Paternal Grandmother    Arthritis Son    Heart attack Son    High Cholesterol Son    Arthritis Daughter      Social History   Socioeconomic History   Marital status: Widowed    Spouse name: Not on file   Number of children: Not on file   Years of education: Not on file   Highest education level: Not on file  Occupational History   Not on file  Tobacco Use   Smoking status: Former    Packs/day: 1.00    Years: 40.00    Pack years: 40.00     Types: Cigarettes   Smokeless tobacco: Never  Vaping Use   Vaping Use: Never used  Substance and Sexual Activity   Alcohol use: Never   Drug use: Never   Sexual activity: Not on file  Other Topics Concern   Not on file  Social History Narrative   Right handed    Lives alone    Social Determinants of Health   Financial Resource Strain: Low Risk    Difficulty of Paying Living Expenses: Not hard at all  Food Insecurity: No Food Insecurity   Worried About Programme researcher, broadcasting/film/video in the Last Year: Never true   Ran Out of Food in the Last Year: Never true  Transportation Needs: No Transportation Needs   Lack of Transportation (Medical): No   Lack of Transportation (Non-Medical): No  Physical Activity: Inactive   Days of Exercise per Week: 0 days   Minutes of Exercise per Session: 0 min  Stress: No Stress Concern Present   Feeling of Stress : Not at all  Social Connections: Moderately Isolated   Frequency of Communication with Friends and Family: Three times a week   Frequency of Social Gatherings with Friends and Family: Three times a week  Attends Religious Services: More than 4 times per year   Active Member of Clubs or Organizations: No   Attends Banker Meetings: Never   Marital Status: Widowed  Catering manager Violence: Not At Risk   Fear of Current or Ex-Partner: No   Emotionally Abused: No   Physically Abused: No   Sexually Abused: No    From [place], has lived in [place] and [place], now [place]   Allergies  Allergen Reactions   Collagen     Redness, swelling     Outpatient Medications Prior to Visit  Medication Sig Dispense Refill   alendronate (FOSAMAX) 70 MG tablet Take 1 tablet (70 mg total) by mouth every 7 (seven) days. Take with a full glass of water on an empty stomach. 12 tablet 3   atorvastatin (LIPITOR) 10 MG tablet TAKE 1 TABLET EVERY DAY 90 tablet 0   calcium citrate-vitamin D (CITRACAL+D) 315-200 MG-UNIT tablet Take 1 tablet by mouth 2 (two)  times daily.     cetirizine (ZYRTEC) 10 MG tablet Take 10 mg by mouth daily.     gabapentin (NEURONTIN) 600 MG tablet TAKE 1 TABLET TWICE DAILY 180 tablet 3   pantoprazole (PROTONIX) 40 MG tablet TAKE 1 TABLET EVERY DAY 90 tablet 3   No facility-administered medications prior to visit.         Objective:   Physical Exam  Vitals:   11/27/20 1637  BP: (!) 146/80  Pulse: 73  Temp: (!) 97.5 F (36.4 C)  TempSrc: Oral  SpO2: 96%  Weight: 210 lb 9.6 oz (95.5 kg)  Height: 5\' 6"  (1.676 m)   Gen: Pleasant, obese woman, in no distress,  normal affect  ENT: No lesions,  mouth clear,  oropharynx clear, no postnasal drip  Neck: No JVD, no stridor  Lungs: No use of accessory muscles, no crackles or wheezing on normal respiration, no wheeze on forced expiration  Cardiovascular: RRR, heart sounds normal, no murmur or gallops, no peripheral edema  Musculoskeletal: No deformities, no cyanosis or clubbing  Neuro: alert, awake, non focal  Skin: Warm, no lesions or rash     Assessment & Plan:  Chronic cough This is her principal symptom, has been correlated with GERD, also chronic rhinitis.  She is better when both are treated.  She had more flaring symptoms when she came to .  Advair was started, symptoms of still been up and down.  Her cough is improved at least for now.  She needs pulmonary function testing to quantify her degree of obstruction.  Certainly she is at risk for COPD given her tobacco history, also asthma which was diagnosed in [place] when she did have PFT.  Depending on her degree of obstruction we will decide what medications may be helpful.  We will not restart Advair for now.  Continue the PPI and Zyrtec.  Continue your pantoprazole as you have been taking it. Continue Zyrtec as you have been taking it. Do not restart Advair for now. We will perform pulmonary function testing in next office visit. We will perform a chest x-ray today. Follow with Dr.  [place] so that we can review your PFT and determine whether any medication adjustments are necessary.   Delton Coombes, MD, PhD 11/27/2020, 5:02 PM [facility] [phone] or if no answer before 7:00PM call [phone] For any issues after 7:00PM please call eLink [phone]

## 2020-11-27 NOTE — Patient Instructions (Addendum)
Continue your pantoprazole as you have been taking it. Continue Zyrtec as you have been taking it. Do not restart Advair for now. We will perform pulmonary function testing in next office visit. We will perform a chest x-ray today. Follow with Dr. Delton Coombes so that we can review your PFT and determine whether any medication adjustments are necessary.

## 2020-12-01 ENCOUNTER — Ambulatory Visit: Payer: Medicare Other | Attending: Family Medicine | Admitting: Audiologist

## 2020-12-01 ENCOUNTER — Other Ambulatory Visit: Payer: Self-pay

## 2020-12-01 DIAGNOSIS — H90A31 Mixed conductive and sensorineural hearing loss, unilateral, right ear with restricted hearing on the contralateral side: Secondary | ICD-10-CM | POA: Insufficient documentation

## 2020-12-01 DIAGNOSIS — H90A22 Sensorineural hearing loss, unilateral, left ear, with restricted hearing on the contralateral side: Secondary | ICD-10-CM | POA: Insufficient documentation

## 2020-12-01 NOTE — Procedures (Signed)
  Outpatient Aud[facility] [address] [place], [place]  [address] [phone]  AUDIOLOGICAL  EVALUATION  NAME: Jillian Guerrero     DOB:   1945-07-21      MRN: [id number]                                                                                     DATE: 12/01/2020     REFERENT: Wynn Banker, MD STATUS: Outpatient DIAGNOSIS: Mild Hearing Loss Both Ears, Mixed Hearing Loss Right Ear    History: [name] was seen for an audiological evaluation.  [name] is receiving a hearing evaluation due to concerns for difficulty hearing on occasion. [name] recently had an episode where she could not hear for several days. She noticed she was asking people to repeat often. This difficulty has since cleared and she is hearing better now. [name] has a long history of middle ear issues. As a child she was hospitalized for hear infections in her right ear. Then as an adult she has ruptured her right eardrum many times while flying. She has allergies and post nasal drip often. This difficulty began gradually. No pain or pressure reported in either ear today. Tinnitus present in both ears. Medical history negative for a health condition which is a risk factor for hearing loss. No other relevant case history reported.    Evaluation:  Otoscopy showed a clear view of the tympanic membranes, bilaterally Tympanometry results were consistent with hypercompliance in the right ear and normal response in the left ear  Audiometric testing was completed using conventional audiometry with insert transducer. Speech Recognition Thresholds were consistent with pure tone averages. Word Recognition was excellent at conversation level. Pure tone thresholds show normal to mild hearing loss hearing loss in both ears with conductive component to the right ear at 1k and 2k Hz. Test results are consistent with abnormal middle ear function in right ear.  Results:  The test results were reviewed with  [name]. She has long standing middle ear difficulty in the right ear. 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 [name] is not interested in hearing aids.   Recommendations: 1.   Recommend annual hearing tests to monitor mild hearing loss in both ears.   Ammie Ferrier  Audiologist, Au.D., CCC-A 12/01/2020  2:18 PM  Cc: Wynn Banker, MD

## 2020-12-23 ENCOUNTER — Telehealth (HOSPITAL_COMMUNITY): Payer: Self-pay

## 2020-12-23 NOTE — Telephone Encounter (Signed)
Called pt regarding recent referral, no answer, left vm. AW ?

## 2020-12-24 ENCOUNTER — Other Ambulatory Visit (HOSPITAL_COMMUNITY): Payer: Self-pay | Admitting: Interventional Radiology

## 2020-12-24 DIAGNOSIS — M545 Low back pain, unspecified: Secondary | ICD-10-CM

## 2020-12-29 ENCOUNTER — Other Ambulatory Visit: Payer: Self-pay

## 2020-12-29 ENCOUNTER — Ambulatory Visit (HOSPITAL_COMMUNITY)
Admission: RE | Admit: 2020-12-29 | Discharge: 2020-12-29 | Disposition: A | Discharge status: Home / Self Care | Payer: Medicare Other | Source: Ambulatory Visit | Attending: Interventional Radiology | Admitting: Interventional Radiology

## 2020-12-29 DIAGNOSIS — M545 Low back pain, unspecified: Secondary | ICD-10-CM

## 2020-12-30 ENCOUNTER — Other Ambulatory Visit (HOSPITAL_COMMUNITY): Payer: Self-pay | Admitting: Interventional Radiology

## 2020-12-31 ENCOUNTER — Other Ambulatory Visit (HOSPITAL_COMMUNITY): Payer: Self-pay | Admitting: Interventional Radiology

## 2020-12-31 DIAGNOSIS — M545 Low back pain, unspecified: Secondary | ICD-10-CM

## 2021-01-02 ENCOUNTER — Other Ambulatory Visit: Payer: Self-pay

## 2021-01-02 ENCOUNTER — Ambulatory Visit (INDEPENDENT_AMBULATORY_CARE_PROVIDER_SITE_OTHER): Payer: Medicare Other | Admitting: Emergency Medicine

## 2021-01-02 ENCOUNTER — Encounter: Payer: Self-pay | Admitting: Emergency Medicine

## 2021-01-02 DIAGNOSIS — K219 Gastro-esophageal reflux disease without esophagitis: Secondary | ICD-10-CM

## 2021-01-02 DIAGNOSIS — R053 Chronic cough: Secondary | ICD-10-CM | POA: Diagnosis not present

## 2021-01-02 DIAGNOSIS — J42 Unspecified chronic bronchitis: Secondary | ICD-10-CM | POA: Diagnosis not present

## 2021-01-02 DIAGNOSIS — J310 Chronic rhinitis: Secondary | ICD-10-CM

## 2021-01-02 LAB — PULMONARY FUNCTION TEST
DL/VA % pred: 121 %
DL/VA: 4.86 ml/min/mmHg/L
DLCO cor % pred: 99 %
DLCO cor: 21.03 ml/min/mmHg
DLCO unc % pred: 99 %
DLCO unc: 21.03 ml/min/mmHg
FEF 25-75 Post: 1.37 L/sec
FEF 25-75 Pre: 1.35 L/sec
FEF2575-%Change-Post: 1 %
FEF2575-%Pred-Post: 76 %
FEF2575-%Pred-Pre: 75 %
FEV1-%Change-Post: 5 %
FEV1-%Pred-Post: 82 %
FEV1-%Pred-Pre: 78 %
FEV1-Post: 1.95 L
FEV1-Pre: 1.85 L
FEV1FVC-%Change-Post: 3 %
FEV1FVC-%Pred-Pre: 96 %
FEV6-%Change-Post: 2 %
FEV6-%Pred-Post: 86 %
FEV6-%Pred-Pre: 84 %
FEV6-Post: 2.6 L
FEV6-Pre: 2.53 L
FEV6FVC-%Change-Post: 0 %
FEV6FVC-%Pred-Post: 104 %
FEV6FVC-%Pred-Pre: 104 %
FVC-%Change-Post: 1 %
FVC-%Pred-Post: 83 %
FVC-%Pred-Pre: 81 %
FVC-Post: 2.62 L
FVC-Pre: 2.57 L
Post FEV1/FVC ratio: 75 %
Post FEV6/FVC ratio: 99 %
Pre FEV1/FVC ratio: 72 %
Pre FEV6/FVC Ratio: 100 %
RV % pred: 130 %
RV: 3.21 L
TLC % pred: 103 %
TLC: 5.68 L

## 2021-01-02 NOTE — Progress Notes (Signed)
Subjective:    Patient ID: Jillian Guerrero, female    DOB: 1945-03-08, 75 y.o.   MRN: [id number]  HPI 75 year old woman with a history of former tobacco use (40 pack years), hyperlipidemia, GERD, peripheral neuropathy, vocal cord polyps, possible asthma.  She is here today to discuss suspected obstructive lung disease.  She has a hx frequent bronchitis all of her life. Has dealt with chronic cough, was particularly bad in late 90's and she saw Pulm. Was started on PPI with improvement in her cough, also zyrtec. No more congestion or drainage. There was question of asthma in [place] - eventually able to come off inhaled meds after treating the above and stopping smoking.  Her cough and UA noise returned when she moved to [facility] and was exposed to construction and dust in her new house. She was restarted on Advair here about 1 yr ago. Cough has been up and down.   ROV 01/02/21 --75 year old woman with history of former tobacco use, vocal cord polyps, suspected asthma.  She has dealt principally with chronic cough.  This was improved when she was treated for GERD, rhinitis.  We continued pantoprazole, Zyrtec and stopped her Advair.  She had pulmonary function testing today as below. She does have some daily intermittent cough. She tolerated stopping Advair. Very rare albuterol use.   Chest x-ray done 11/08/27/2022 reviewed by me, shows no abnormalities  Pulmonary function testing performed today and reviewed by me shows some evidence for mild obstruction without a bronchodilator response, normal lung volumes, normal diffusion capacity. Some curve to her FV loop, mild.    Review of Systems As per HPI   Past Medical History:  Diagnosis Date   GERD (gastroesophageal reflux disease)    Hyperlipidemia    Lumbar compression fracture (HCC)    Osteoporosis    Peripheral neuropathy      Family History  Problem Relation Age of Onset   Arthritis Mother    Heart disease Mother    Arthritis Father     Depression Father    Arthritis Brother    High blood pressure Brother    High Cholesterol Brother    Arthritis Maternal Grandfather    Asthma Paternal Grandmother    Depression Paternal Grandmother    Heart disease Paternal Grandmother    High Cholesterol Paternal Grandmother    Arthritis Son    Heart attack Son    High Cholesterol Son    Arthritis Daughter      Social History   Socioeconomic History   Marital status: Widowed    Spouse name: Not on file   Number of children: Not on file   Years of education: Not on file   Highest education level: Not on file  Occupational History   Not on file  Tobacco Use   Smoking status: Former    Packs/day: 1.00    Years: 40.00    Pack years: 40.00    Types: Cigarettes   Smokeless tobacco: Never  Vaping Use   Vaping Use: Never used  Substance and Sexual Activity   Alcohol use: Never   Drug use: Never   Sexual activity: Not on file  Other Topics Concern   Not on file  Social History Narrative   Right handed    Lives alone    Social Determinants of Health   Financial Resource Strain: Low Risk    Difficulty of Paying Living Expenses: Not hard at all  Food Insecurity: No Food Insecurity   Worried  About Running Out of Food in the Last Year: Never true   Ran Out of Food in the Last Year: Never true  Transportation Needs: No Transportation Needs   Lack of Transportation (Medical): No   Lack of Transportation (Non-Medical): No  Physical Activity: Inactive   Days of Exercise per Week: 0 days   Minutes of Exercise per Session: 0 min  Stress: No Stress Concern Present   Feeling of Stress : Not at all  Social Connections: Moderately Isolated   Frequency of Communication with Friends and Family: Three times a week   Frequency of Social Gatherings with Friends and Family: Three times a week   Attends Religious Services: More than 4 times per year   Active Member of Clubs or Organizations: No   Attends Banker  Meetings: Never   Marital Status: Widowed  Catering manager Violence: Not At Risk   Fear of Current or Ex-Partner: No   Emotionally Abused: No   Physically Abused: No   Sexually Abused: No    From [place], has lived in [place] and [place], now [place]   Allergies  Allergen Reactions   Collagen     Redness, swelling     Outpatient Medications Prior to Visit  Medication Sig Dispense Refill   alendronate (FOSAMAX) 70 MG tablet Take 1 tablet (70 mg total) by mouth every 7 (seven) days. Take with a full glass of water on an empty stomach. 12 tablet 3   atorvastatin (LIPITOR) 10 MG tablet TAKE 1 TABLET EVERY DAY 90 tablet 0   calcium citrate-vitamin D (CITRACAL+D) 315-200 MG-UNIT tablet Take 1 tablet by mouth 2 (two) times daily.     cetirizine (ZYRTEC) 10 MG tablet Take 10 mg by mouth daily.     gabapentin (NEURONTIN) 600 MG tablet TAKE 1 TABLET TWICE DAILY 180 tablet 3   pantoprazole (PROTONIX) 40 MG tablet TAKE 1 TABLET EVERY DAY 90 tablet 3   No facility-administered medications prior to visit.         Objective:   Physical Exam  Vitals:   01/02/21 1218  BP: 114/64  Pulse: 77  Temp: 97.9 F (36.6 C)  TempSrc: Oral  SpO2: 97%  Weight: 211 lb 6.4 oz (95.9 kg)  Height: 5\' 7"  (1.702 m)   Gen: Pleasant, obese woman, in no distress,  normal affect  ENT: No lesions,  mouth clear,  oropharynx clear, no postnasal drip  Neck: No JVD, no stridor  Lungs: No use of accessory muscles, no crackles or wheezing on normal respiration, no wheeze on forced expiration  Cardiovascular: RRR, heart sounds normal, no murmur or gallops, no peripheral edema  Musculoskeletal: No deformities, no cyanosis or clubbing  Neuro: alert, awake, non focal  Skin: Warm, no lesions or rash     Assessment & Plan:  COPD (chronic obstructive pulmonary disease) (HCC) Subtle pattern of obstruction seen on her spirometry, 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  Obstruction is mild.  Currently not limited by  her breathing.  She does have flares if she encounters triggers.  For now I think she can continue albuterol as needed, hold off on scheduled BD therapy.  Chronic cough Fairly well controlled on her current regimen for GERD and allergic rhinitis.  GERD (gastroesophageal reflux disease) Continue your pantoprazole as you have been taking it.  Chronic rhinitis Continue zyrtec.    , MD, PhD 01/02/2021, 5:27 PM [facility] [phone] or if no answer before 7:00PM call [phone] For any issues  after 7:00PM please call eLink [phone]

## 2021-01-02 NOTE — Assessment & Plan Note (Signed)
Fairly well controlled on her current regimen for GERD and allergic rhinitis.

## 2021-01-02 NOTE — Assessment & Plan Note (Signed)
Continue zyrtec.  

## 2021-01-02 NOTE — Assessment & Plan Note (Signed)
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  Obstruction is mild.  Currently not limited by her breathing.  She does have flares if she encounters triggers.  For now I think she can continue albuterol as needed, hold off on scheduled BD therapy.

## 2021-01-02 NOTE — Progress Notes (Signed)
PFT done today. 

## 2021-01-02 NOTE — Assessment & Plan Note (Signed)
Continue your pantoprazole as you have been taking it 

## 2021-01-02 NOTE — Patient Instructions (Addendum)
We reviewed your pulmonary function testing today.  This shows evidence for some mild intermittent asthma.  This could cause symptoms intermittently when you do have triggers like an upper respiratory infection, allergies, etc. We do not need to stay on an every day scheduled inhaler Keep your albuterol available to use 2 puffs when you needed for shortness of breath, chest tightness, wheezing. Continue your pantoprazole as you have been taking it. Continue your Zyrtec as you have been taking it. Follow with Dr. Delton Coombes in 12 months or sooner if you have any problems.

## 2021-01-05 ENCOUNTER — Ambulatory Visit
Admission: RE | Admit: 2021-01-05 | Discharge: 2021-01-05 | Disposition: A | Discharge status: Home / Self Care | Payer: Self-pay | Source: Ambulatory Visit | Attending: Interventional Radiology | Admitting: Interventional Radiology

## 2021-01-05 ENCOUNTER — Other Ambulatory Visit (HOSPITAL_COMMUNITY): Payer: Self-pay | Admitting: Interventional Radiology

## 2021-01-05 DIAGNOSIS — R52 Pain, unspecified: Secondary | ICD-10-CM

## 2021-01-08 ENCOUNTER — Encounter (HOSPITAL_COMMUNITY)
Admission: RE | Admit: 2021-01-08 | Discharge: 2021-01-08 | Disposition: A | Discharge status: Home / Self Care | Payer: Medicare Other | Source: Ambulatory Visit | Attending: Interventional Radiology | Admitting: Interventional Radiology

## 2021-01-08 ENCOUNTER — Other Ambulatory Visit: Payer: Self-pay

## 2021-01-08 DIAGNOSIS — M545 Low back pain, unspecified: Secondary | ICD-10-CM

## 2021-01-08 IMAGING — NM NM BONE WHOLE BODY
2 series · 2 of 2 positions shown · non-contrast
Comparison: Lumbar radiographs [DATE], MRI [DATE]

CLINICAL DATA: Low back pain, remote fall with lumbar vertebral
fracture status post vertebral augmentation.

EXAM:
NUCLEAR MEDICINE WHOLE BODY BONE SCAN
TECHNIQUE: Whole body anterior and posterior images were obtained approximately
3 hours after intravenous injection of radiopharmaceutical.
RADIOPHARMACEUTICALS:  21.9 mCi [AO] MDP IV

[Series 1: whole body · 2.66mm/px · 1 of 1 slices shown (1 of 2)]
[im 1/1]
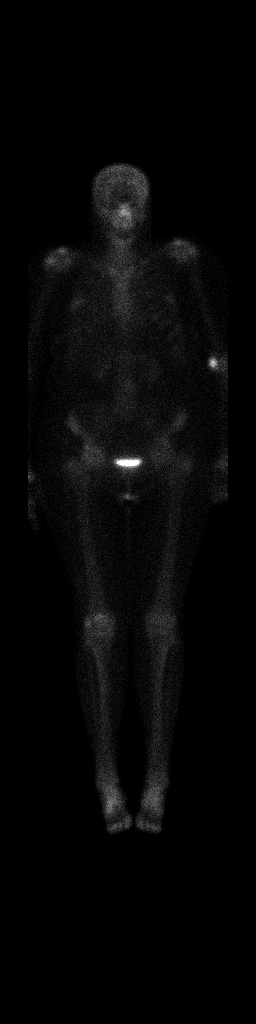

[Series 1: whole body · 2.66mm/px · 1 of 1 slices shown (2 of 2)]
[im 1/1]
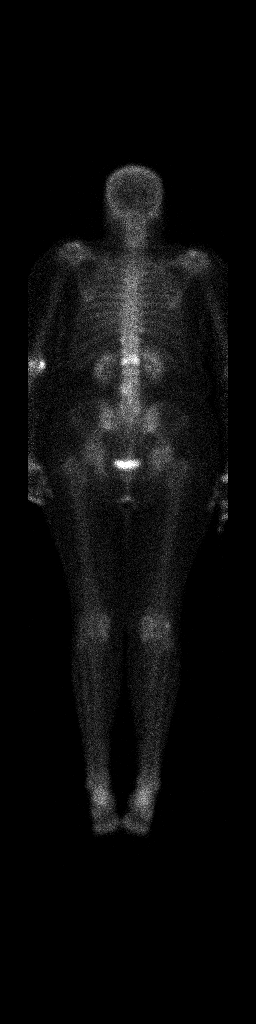

[2 of 2 positions shown; findings below may reference images not displayed]

FINDINGS: Mild uptake noted within the left antecubital fossa relates to
injection site contamination. There is intense focal uptake within
the L1 vertebral body corresponding to the known fracture with
retropulsion better appreciated on plain radiographs of [DATE].
Mild asymmetric uptake within the right L3-4 facet is likely
degenerative in nature. Uptake within the paranasal sinuses may
relate to paranasal sinus disease. Uptake within the shoulders and
right knee is likely degenerative in nature. Normal soft tissue
distribution. Normal uptake and excretion within the kidneys and
bladder.
IMPRESSION: Intense uptake within the L1 vertebral body corresponding to known
fracture better seen on accompanying radiographs. Vertebral
augmentation has been performed at this level and uptake is
nonspecific, possibly representing postprocedural change, asymmetric
degenerative change or recurrent fracture.

Polyarticular degenerative change as outlined above.

Probable paranasal sinus disease.

## 2021-01-08 MED ORDER — TECHNETIUM TC 99M MEDRONATE IV KIT
21.9000 | PACK | Freq: Once | INTRAVENOUS | Status: AC | PRN
  Administered 2021-01-08: 21.9 via INTRAVENOUS

## 2021-01-19 ENCOUNTER — Other Ambulatory Visit: Payer: Self-pay | Admitting: Family Medicine

## 2021-01-21 HISTORY — PX: IR RADIOLOGIST EVAL & MGMT: IMG5224

## 2021-02-09 ENCOUNTER — Telehealth: Payer: Self-pay | Admitting: Family Medicine

## 2021-02-09 NOTE — Telephone Encounter (Signed)
Patient calling in with respiratory symptoms: Shortness of breath, chest pain, palpitations or other red words send to Triage  Does the patient have a fever over 100, cough, congestion, sore throat, runny nose, lost of taste/smell (please list symptoms that patient has)? Cough, sore throat, congestion  What date did symptoms start? Patient says 8 days ago initially and then stated two days ago towards the end of the conversation (If over 5 days ago, pt may be scheduled for in person visit)  Have you tested for Covid in the last 5 days? No    "you will have to arrive prior to your appt time to be Covid tested. Please park in back of office at the [facility] & call [phone] to let the staff know you have arrived. A staff member will meet you at your car to do a rapid covid test. Once the test has resulted you will be notified by phone of your results to determine if appt will remain an in person visit or be converted to a virtual/phone visit. If you arrive less than before your appt time, your visit will be automatically converted to virtual & any recommended testing will happen AFTER the visit."   Asked patient if she has been tested for COVID or the flu. Patient responded that "she has never had the flu ever in her life". Asked patient again if she has been tested for COVID and flu and patient responded that "she does not need to be tested because she thinks it is just a head cold.  Patient will arrive 45 minutes for testing and was instructed to [facility] at [facility].

## 2021-02-10 ENCOUNTER — Ambulatory Visit (INDEPENDENT_AMBULATORY_CARE_PROVIDER_SITE_OTHER): Payer: Medicare Other | Admitting: Family Medicine

## 2021-02-10 VITALS — BP 124/76 | HR 87 | Temp 97.7°F | Wt 214.3 lb

## 2021-02-10 DIAGNOSIS — R062 Wheezing: Secondary | ICD-10-CM | POA: Diagnosis not present

## 2021-02-10 DIAGNOSIS — J0110 Acute frontal sinusitis, unspecified: Secondary | ICD-10-CM

## 2021-02-10 MED ORDER — PREDNISONE 20 MG PO TABS: ORAL_TABLET | ORAL | 0 refills | Status: DC

## 2021-02-10 MED ORDER — ALBUTEROL SULFATE HFA 108 (90 BASE) MCG/ACT IN AERS: 2.0000 | INHALATION_SPRAY | Freq: Four times a day (QID) | RESPIRATORY_TRACT | 1 refills | Status: DC | PRN

## 2021-02-10 MED ORDER — AMOXICILLIN-POT CLAVULANATE 875-125 MG PO TABS: 1.0000 | ORAL_TABLET | Freq: Two times a day (BID) | ORAL | 0 refills | Status: DC

## 2021-02-10 NOTE — Progress Notes (Signed)
Established Patient Office Visit  Subjective:  Patient ID: Jillian Guerrero, female    DOB: August 17, 1945  Age: 75 y.o. MRN: [id number]  CC:  Chief Complaint  Patient presents with   Cough    Asthma, cough, congestion, wheezing, x 1 week     HPI Jillian Guerrero presents for cough and wheezing for the past week or so.  She states she was around her grandson who had a cold.  She felt like she caught the cold.  She does have history of reported COPD.  Former smoker.  Currently not using any inhalers.  She does not have albuterol for as needed use.  She states she has had some wheezing for the past several days and has had difficulty clearing in the past without prednisone.  She also relates facial pain especially frontal sinus region is concerned she is getting sinus infection.  Some postnasal drainage.  Occasional headache.  No fever.  Past Medical History:  Diagnosis Date   GERD (gastroesophageal reflux disease)    Hyperlipidemia    Lumbar compression fracture (HCC)    Osteoporosis    Peripheral neuropathy     Past Surgical History:  Procedure Laterality Date   APPENDECTOMY  1956   BLADDER SUSPENSION  2003   BREAST BIOPSY  2015   hyaluronic acid viscosupplementation Bilateral    knees - 01/16/2018 x2   IR RADIOLOGIST EVAL & MGMT  01/21/2021   KYPHOPLASTY  10/2018   TONSILLECTOMY  1964   UPPER GI ENDOSCOPY  03/08/2019   gastritis    Family History  Problem Relation Age of Onset   Arthritis Mother    Heart disease Mother    Arthritis Father    Depression Father    Arthritis Brother    High blood pressure Brother    High Cholesterol Brother    Arthritis Maternal Grandfather    Asthma Paternal Grandmother    Depression Paternal Grandmother    Heart disease Paternal Grandmother    High Cholesterol Paternal Grandmother    Arthritis Son    Heart attack Son    High Cholesterol Son    Arthritis Daughter     Social History   Socioeconomic History   Marital status:  Widowed    Spouse name: Not on file   Number of children: Not on file   Years of education: Not on file   Highest education level: 12th grade  Occupational History   Not on file  Tobacco Use   Smoking status: Former    Packs/day: 1.00    Years: 40.00    Pack years: 40.00    Types: Cigarettes   Smokeless tobacco: Never  Vaping Use   Vaping Use: Never used  Substance and Sexual Activity   Alcohol use: Never   Drug use: Never   Sexual activity: Not on file  Other Topics Concern   Not on file  Social History Narrative   Right handed    Lives alone    Social Determinants of Health   Financial Resource Strain: Low Risk    Difficulty of Paying Living Expenses: Not hard at all  Food Insecurity: No Food Insecurity   Worried About Programme researcher, broadcasting/film/video in the Last Year: Never true   Ran Out of Food in the Last Year: Never true  Transportation Needs: No Transportation Needs   Lack of Transportation (Medical): No   Lack of Transportation (Non-Medical): No  Physical Activity: Inactive   Days of Exercise per Week:  0 days   Minutes of Exercise per Session: 0 min  Stress: No Stress Concern Present   Feeling of Stress : Not at all  Social Connections: Moderately Isolated   Frequency of Communication with Friends and Family: More than three times a week   Frequency of Social Gatherings with Friends and Family: More than three times a week   Attends Religious Services: More than 4 times per year   Active Member of [employer] or Organizations: No   Attends Banker Meetings: Never   Marital Status: Widowed  Catering manager Violence: Not At Risk   Fear of Current or Ex-Partner: No   Emotionally Abused: No   Physically Abused: No   Sexually Abused: No    Outpatient Medications Prior to Visit  Medication Sig Dispense Refill   alendronate (FOSAMAX) 70 MG tablet Take 1 tablet (70 mg total) by mouth every 7 (seven) days. Take with a full glass of water on an empty stomach. 12  tablet 3   atorvastatin (LIPITOR) 10 MG tablet TAKE 1 TABLET EVERY DAY 90 tablet 3   calcium citrate-vitamin D (CITRACAL+D) 315-200 MG-UNIT tablet Take 1 tablet by mouth 2 (two) times daily.     cetirizine (ZYRTEC) 10 MG tablet Take 10 mg by mouth daily.     gabapentin (NEURONTIN) 600 MG tablet TAKE 1 TABLET TWICE DAILY 180 tablet 3   pantoprazole (PROTONIX) 40 MG tablet TAKE 1 TABLET EVERY DAY 90 tablet 3   No facility-administered medications prior to visit.    Allergies  Allergen Reactions   Collagen     Redness, swelling    ROS Review of Systems  Constitutional:  Negative for chills and fever.  HENT:  Positive for congestion, sinus pressure and sinus pain.   Respiratory:  Positive for cough, shortness of breath and wheezing.   Cardiovascular:  Negative for chest pain.     Objective:    Physical Exam Vitals reviewed.  HENT:     Right Ear: Tympanic membrane normal.     Left Ear: Tympanic membrane normal.  Cardiovascular:     Rate and Rhythm: Normal rate and regular rhythm.  Pulmonary:     Effort: Pulmonary effort is normal.     Comments: She has some diffuse expiratory wheezes but no respiratory distress.  No rales. Musculoskeletal:     Cervical back: Neck supple.  Lymphadenopathy:     Cervical: No cervical adenopathy.  Neurological:     Mental Status: She is alert.    BP 124/76 (BP Location: Left Arm, Patient Position: Sitting, Cuff Size: Normal)    Pulse 87    Temp 97.7 F (36.5 C) (Oral)    Wt 214 lb 4.8 oz (97.2 kg)    SpO2 99%    BMI 33.56 kg/m  Wt Readings from Last 3 Encounters:  02/10/21 214 lb 4.8 oz (97.2 kg)  01/02/21 211 lb 6.4 oz (95.9 kg)  11/27/20 210 lb 9.6 oz (95.5 kg)     Health Maintenance Due  Topic Date Due   Zoster Vaccines- Shingrix (1 of 2) Never done   COVID-19 Vaccine (4 - Booster for Moderna series) 06/03/2020   INFLUENZA VACCINE  09/29/2020    There are no preventive care reminders to display for this patient.  Lab Results   Component Value Date   TSH 1.92 03/24/2020   Lab Results  Component Value Date   WBC 5.1 03/24/2020   HGB 14.5 03/24/2020   HCT 43.2 03/24/2020   MCV 87.3  03/24/2020   PLT 176.0 03/24/2020   Lab Results  Component Value Date   NA 142 03/24/2020   K 4.4 03/24/2020   CO2 31 03/24/2020   GLUCOSE 95 03/24/2020   BUN 12 03/24/2020   CREATININE 0.71 03/24/2020   BILITOT 0.7 03/24/2020   ALKPHOS 37 (L) 03/24/2020   AST 13 03/24/2020   ALT 12 03/24/2020   PROT 6.3 03/24/2020   ALBUMIN 4.3 03/24/2020   CALCIUM 9.4 03/24/2020   GFR 83.24 03/24/2020   Lab Results  Component Value Date   CHOL 162 03/24/2020   Lab Results  Component Value Date   HDL 51.60 03/24/2020   Lab Results  Component Value Date   LDLCALC 84 03/24/2020   Lab Results  Component Value Date   TRIG 131.0 03/24/2020   Lab Results  Component Value Date   CHOLHDL 3 03/24/2020   No results found for: HGBA1C    Assessment & Plan:   Patient presents with increased cough and wheezing following likely viral URI.  She does have progressive facial pain may have some sinusitis issues as well.  -Prednisone 20 mg 2 tablets daily for 5 days -Refill Ventolin inhaler 2 puffs every 6 hours as needed for cough and wheeze -Augmentin 875 mg twice daily with food for 7 days -Follow-up for persistent or worsening symptoms  Meds ordered this encounter  Medications   albuterol (VENTOLIN HFA) 108 (90 Base) MCG/ACT inhaler    Sig: Inhale 2 puffs into the lungs every 6 (six) hours as needed for wheezing or shortness of breath.    Dispense:  8 g    Refill:  1   predniSONE (DELTASONE) 20 MG tablet    Sig: Take two tablets by mouth once daily for 5 days    Dispense:  10 tablet    Refill:  0   amoxicillin-clavulanate (AUGMENTIN) 875-125 MG tablet    Sig: Take 1 tablet by mouth 2 (two) times daily.    Dispense:  14 tablet    Refill:  0    Follow-up: No follow-ups on file.    Evelena Peat, MD

## 2021-03-29 ENCOUNTER — Other Ambulatory Visit: Payer: Self-pay | Admitting: Family Medicine

## 2021-06-09 ENCOUNTER — Encounter: Payer: Self-pay | Admitting: Family Medicine

## 2021-06-22 NOTE — Progress Notes (Signed)
? ? ?ACUTE VISIT ?Chief Complaint  ?Patient presents with  ? Leg Pain  ?  Wants to discuss referral to vein specialist  ? ?HPI: ?[name] is a 76 y.o. female with hx of vit D def,peripheral neuropathy, GERD,HLD,and chronic cough here today complaining of LE pain and would like referral as described above. ?Burning like sensation. ?Problem is worse at night when she is in bed. ?LE cramps at night, "deep inside" legs. ? ?States that she has been evaluated by neurologist and she was told she did not have neuropathy. She is on Gabapentin 600 mg bid, pain gets worse when she has tried to stop it. ? ?Leg Pain  ?There was no injury mechanism. The pain is moderate. The pain has been Intermittent since onset. Pertinent negatives include no inability to bear weight, loss of motion, loss of sensation or muscle weakness.  ?Varicose veins , she has seen vascular abiut 5 years ago. ?Negative for LE edema or erythema. ? ?Lumbar MRI done on 10/20/20  ?Status post remote L1 compression deformity and kyphoplasty, with increased T2 signal in the superior posterior aspect of L1, ?concerning for more acute fracture component, although no prior MRI is available for comparison. Although this causes narrowing of the thecal sac, there is no evidence of cord compression ? ?Recurrent herplex simplex infection: Getting worse for the past 6 months. ?Having 2 episode per month for 5-6 months, affecting the same area, in between buttocks. It causes severe pain and aggravates her chronic back pain.She feels like it is aggravating her quality of life. ? ?Stress and heat seem to aggravating problems. Has been traveling more oftern, which is itself stressful for her. ?She takes Valtrex as needed. ? ?She would like FLP done. ?HLD on Atorvastatin 10 mg daily. ? ?Lab Results  ?Component Value Date  ? CHOL 162 03/24/2020  ? HDL 51.60 03/24/2020  ? LDLCALC 84 03/24/2020  ? TRIG 131.0 03/24/2020  ? CHOLHDL 3 03/24/2020  ? ? ?She is also concerned  about wt, she would like her "hormones check."States that she has a friend who got all her hormones check to figure out why she was gaining wt. ?She tries to eat healthy, has not been consistent with regular physical activity. ?Lab Results  ?Component Value Date  ? TSH 1.92 03/24/2020  ? ?C/O thick and tender toenails, problem has been going on for years.She thinks it is a fungal infection. ?She has not noted periungual edema or erythema. ?Aggravates by certain shoe wear. ?She has not used OTC products. ? She used to see podiatrist when she was living in [place]. ? ?Review of Systems  ?Constitutional:  Negative for activity change, appetite change and fever.  ?HENT:  Negative for mouth sores, nosebleeds and sore throat.   ?Respiratory:  Negative for shortness of breath and wheezing.   ?Cardiovascular:  Negative for chest pain and palpitations.  ?Gastrointestinal:  Negative for abdominal pain, nausea and vomiting.  ?     Negative for changes in bowel habits.  ?Endocrine: Negative for cold intolerance and heat intolerance.  ?Genitourinary:  Negative for decreased urine volume, hematuria, vaginal bleeding and vaginal discharge.  ?Musculoskeletal:  Positive for back pain. Negative for gait problem.  ?Neurological:  Negative for syncope and headaches.  ?Psychiatric/Behavioral:  The patient is nervous/anxious.   ?Rest see pertinent positives and negatives per HPI. ? ?Current Outpatient Medications on File Prior to Visit  ?Medication Sig Dispense Refill  ? albuterol (VENTOLIN HFA) 108 (90 Base) MCG/ACT inhaler  Inhale 2 puffs into the lungs every 6 (six) hours as needed for wheezing or shortness of breath. 8 g 1  ? alendronate (FOSAMAX) 70 MG tablet TAKE 1 TABLET (70 MG TOTAL) BY MOUTH EVERY 7 (SEVEN) DAYS. TAKE WITH A FULL GLASS OF WATER ON AN EMPTY STOMACH. 12 tablet 3  ? atorvastatin (LIPITOR) 10 MG tablet TAKE 1 TABLET EVERY DAY 90 tablet 3  ? calcium citrate-vitamin D (CITRACAL+D) 315-200 MG-UNIT tablet Take 1  tablet by mouth 2 (two) times daily.    ? cetirizine (ZYRTEC) 10 MG tablet Take 10 mg by mouth daily.    ? gabapentin (NEURONTIN) 600 MG tablet TAKE 1 TABLET TWICE DAILY 180 tablet 3  ? pantoprazole (PROTONIX) 40 MG tablet TAKE 1 TABLET EVERY DAY 90 tablet 3  ? ?No current facility-administered medications on file prior to visit.  ? ?Past Medical History:  ?Diagnosis Date  ? GERD (gastroesophageal reflux disease)   ? Hyperlipidemia   ? Lumbar compression fracture (HCC)   ? Osteoporosis   ? Peripheral neuropathy   ? ?Allergies  ?Allergen Reactions  ? Collagen   ?  Redness, swelling  ? ? ?Social History  ? ?Socioeconomic History  ? Marital status: Widowed  ?  Spouse name: Not on file  ? Number of children: Not on file  ? Years of education: Not on file  ? Highest education level: 12th grade  ?Occupational History  ? Not on file  ?Tobacco Use  ? Smoking status: Former  ?  Packs/day: 1.00  ?  Years: 40.00  ?  Pack years: 40.00  ?  Types: Cigarettes  ? Smokeless tobacco: Never  ?Vaping Use  ? Vaping Use: Never used  ?Substance and Sexual Activity  ? Alcohol use: Never  ? Drug use: Never  ? Sexual activity: Not on file  ?Other Topics Concern  ? Not on file  ?Social History Narrative  ? Right handed   ? Lives alone   ? ?Social Determinants of Health  ? ?Financial Resource Strain: Low Risk   ? Difficulty of Paying Living Expenses: Not hard at all  ?Food Insecurity: No Food Insecurity  ? Worried About Programme researcher, broadcasting/film/video in the Last Year: Never true  ? Ran Out of Food in the Last Year: Never true  ?Transportation Needs: No Transportation Needs  ? Lack of Transportation (Medical): No  ? Lack of Transportation (Non-Medical): No  ?Physical Activity: Inactive  ? Days of Exercise per Week: 0 days  ? Minutes of Exercise per Session: 0 min  ?Stress: No Stress Concern Present  ? Feeling of Stress : Not at all  ?Social Connections: Moderately Isolated  ? Frequency of Communication with Friends and Family: More than three times a week   ? Frequency of Social Gatherings with Friends and Family: More than three times a week  ? Attends Religious Services: More than 4 times per year  ? Active Member of Clubs or Organizations: No  ? Attends Banker Meetings: Never  ? Marital Status: Widowed  ? ? ?Vitals:  ? 06/23/21 1006  ?BP: 126/80  ?Pulse: 72  ?Resp: 16  ? ?Body mass index is 32.64 kg/m?. ? ?Physical Exam ?Vitals and nursing note reviewed.  ?Constitutional:   ?   General: She is not in acute distress. ?   Appearance: She is well-developed.  ?HENT:  ?   Head: Normocephalic and atraumatic.  ?   Mouth/Throat:  ?   Mouth: Mucous membranes are moist.  ?  Pharynx: Oropharynx is clear.  ?Eyes:  ?   Conjunctiva/sclera: Conjunctivae normal.  ?Cardiovascular:  ?   Rate and Rhythm: Normal rate and regular rhythm.  ?   Pulses:     ?     Dorsalis pedis pulses are 2+ on the right side and 2+ on the left side.  ?   Heart sounds: No murmur heard. ?   Comments: LE varicose veins, bilateral. ?Pulmonary:  ?   Effort: Pulmonary effort is normal. No respiratory distress.  ?   Breath sounds: Normal breath sounds.  ?Abdominal:  ?   Palpations: Abdomen is soft. There is no hepatomegaly or mass.  ?   Tenderness: There is no abdominal tenderness.  ?Musculoskeletal:  ?   Right lower leg: No edema.  ?   Left lower leg: No edema.  ?   Comments: Lower back pain elicited when lying on examination table.  ?Lymphadenopathy:  ?   Cervical: No cervical adenopathy.  ?Skin: ?   General: Skin is warm.  ?   Findings: No erythema or rash.  ?   Comments: Hypertrophic toenails, nail polish on toenails. No periungual edema or erythema.  ?Neurological:  ?   General: No focal deficit present.  ?   Mental Status: She is alert and oriented to person, place, and time.  ?   Cranial Nerves: No cranial nerve deficit.  ?   Gait: Gait normal.  ?Psychiatric:  ?   Comments: Well groomed, good eye contact.  ? ?ASSESSMENT AND PLAN: ? ?[name] was seen today for leg pain. ? ?Diagnoses and  all orders for this visit: ? ?Pain in both lower extremities ?Chronic. ?We discussed possible causes. Some of her chronic medical conditions could be contributing factors. ?Gabapentin has helped some, so n

## 2021-06-23 ENCOUNTER — Encounter: Payer: Self-pay | Admitting: Family Medicine

## 2021-06-23 ENCOUNTER — Ambulatory Visit (INDEPENDENT_AMBULATORY_CARE_PROVIDER_SITE_OTHER): Payer: Medicare Other | Admitting: Family Medicine

## 2021-06-23 VITALS — BP 126/80 | HR 72 | Resp 16 | Ht 67.0 in | Wt 208.4 lb

## 2021-06-23 DIAGNOSIS — I8393 Asymptomatic varicose veins of bilateral lower extremities: Secondary | ICD-10-CM | POA: Diagnosis not present

## 2021-06-23 DIAGNOSIS — L602 Onychogryphosis: Secondary | ICD-10-CM

## 2021-06-23 DIAGNOSIS — B009 Herpesviral infection, unspecified: Secondary | ICD-10-CM

## 2021-06-23 DIAGNOSIS — M79604 Pain in right leg: Secondary | ICD-10-CM | POA: Diagnosis not present

## 2021-06-23 DIAGNOSIS — M79605 Pain in left leg: Secondary | ICD-10-CM

## 2021-06-23 DIAGNOSIS — Z6832 Body mass index (BMI) 32.0-32.9, adult: Secondary | ICD-10-CM

## 2021-06-23 DIAGNOSIS — E669 Obesity, unspecified: Secondary | ICD-10-CM

## 2021-06-23 DIAGNOSIS — E785 Hyperlipidemia, unspecified: Secondary | ICD-10-CM

## 2021-06-23 LAB — LIPID PANEL
Cholesterol: 150 mg/dL (ref 0–200)
HDL: 52.1 mg/dL (ref >39.00)
LDL Cholesterol: 81 mg/dL (ref 0–99)
NonHDL: 97.72
Total CHOL/HDL Ratio: 3
Triglycerides: 84 mg/dL (ref 0.0–149.0)
VLDL: 16.8 mg/dL (ref 0.0–40.0)

## 2021-06-23 MED ORDER — VALACYCLOVIR HCL 1 G PO TABS: 1000.0000 mg | ORAL_TABLET | Freq: Every day | ORAL | 2 refills | Status: AC

## 2021-06-23 NOTE — Patient Instructions (Addendum)
A few things to remember from today's visit: ? ?Hypertrophic toenail ? ?Varicose veins of both lower extremities, unspecified whether complicated - Plan: Ambulatory referral to Vascular Surgery ? ?Hyperlipidemia, unspecified hyperlipidemia type - Plan: Lipid panel ? ?Recurrent herpes simplex - Plan: valACYclovir (VALTREX) 1000 MG tablet ? ?Class 1 obesity with serious comorbidity and body mass index (BMI) of 32.0 to 32.9 in adult, unspecified obesity type ? ?If you need refills please call your pharmacy. ?Do not use My Chart to request refills or for acute issues that need immediate attention. ?  ?Please be sure medication list is accurate. ?If a new problem present, please set up appointment sooner than planned today. ? ?These are some of podiatrist in town. ?[facility]ane Gunther ?[address] ? [phone] ? ?[facility] ?[address] ? [phone] ? ?Ezequiel Kayser. Harriet Pho, DPM ?[address] [phone] ? [phone] ? ?Varicose Veins ?Varicose veins are veins that have become enlarged, bulged, and twisted. They most often appear in the legs. ?What are the causes? ?This condition is caused by damage to the valves in the vein. These valves help blood return to your heart. When they are damaged and they stop working properly, blood may flow backward and back up in the veins near the skin, causing the veins to get larger and appear twisted. ?The condition can result from any issue that causes blood to back up, like pregnancy, prolonged standing, or obesity. ?What increases the risk? ?The following factors may make you more likely to develop this condition: ?Being on your feet a lot. ?Being pregnant. ?Being overweight. ?Smoking. ?Having had a previous deep vein thrombosis or having a thrombotic disorder. ?Aging. The risk increases with age. ?Having a condition called Klippel-Trenaunay syndrome. ?What are the signs or symptoms? ?Symptoms of this condition include: ?Bulging, twisted, and bluish  veins. ?A feeling of heaviness in your legs. This may be worse at the end of the day. ?Leg pain. This may be worse at the end of the day. ?Swelling in the leg. ?Changes in skin color over the veins. ?Swelling or pain in the legs can limit your activities. Your symptoms may get worse when you sit or stand for long periods of time. ?How is this diagnosed? ?This condition may be diagnosed based on: ?Your symptoms, family history, activity levels, and lifestyle. ?A physical exam. ?You may also have tests, including an ultrasound or X-ray. ?How is this treated? ?Treatment for this condition may involve: ?Avoiding sitting or standing in one position for long periods of time. ?Wearing compression stockings. These stockings help to prevent blood clots and reduce swelling in the legs. ?Raising (elevating) the legs when resting. ?Losing weight. ?Exercising regularly. ?If you have persistent symptoms or want to improve the way your varicose veins look, you may choose to have a procedure to close the varicose veins off or to remove them. ?Nonsurgical treatments to close off the veins include: ?Sclerotherapy. In this treatment, a solution is injected into a vein to close it off. ?Laser treatment. The vein is heated with a laser to close it off. ?Radiofrequency vein ablation. An electrical current produced by radio waves is used to close off the vein. ?Surgical treatments to remove the veins include: ?Phlebectomy. In this procedure, the veins are removed through small incisions made over the veins. ?Vein ligation and stripping. In this procedure, incisions are made over the veins. The veins are then removed after being tied (ligated) with stitches (sutures). ?  Follow these instructions at home: ?Medicines ?Take over-the-counter and prescription medicines only as told by your health care provider. ?If you were prescribed an antibiotic medicine, use it as told by your health care provider. Do not stop using the antibiotic even if  you start to feel better. ?Activity ?Walk as much as possible. Walking increases blood flow. This helps blood return to the heart and takes pressure off your veins. ?Do not stand or sit in one position for a long period of time. ?Do not sit with your legs crossed. ?Avoid sitting for a long time without moving. Get up to take short walks every 1-2 hours. This is important to improve blood flow and breathing. Ask for help if you feel weak or unsteady. ?Return to your normal activities as told by your health care provider. Ask your health care provider what activities are safe for you. ?Do exercises as told by your health care provider. ?General instructions ? ?Follow any diet instructions given to you by your health care provider. ?Elevate your legs at night to above the level of your heart. ?If you get a cut in the skin over the varicose vein and the vein bleeds: ?Lie down with your leg raised. ?Apply firm pressure to the cut with a clean cloth until the bleeding stops. ?Place a bandage (dressing) on the cut. ?Drink enough fluid to keep your urine pale yellow. ?Do not use any products that contain nicotine or tobacco. These products include cigarettes, chewing tobacco, and vaping devices, such as e-cigarettes. If you need help quitting, ask your health care provider. ?Wear compression stockings as told by your health care provider. Do not wear other kinds of tight clothing around your legs, pelvis, or waist. ?Keep all follow-up visits. This is important. ?Contact a health care provider if: ?The skin around your varicose veins starts to break down. ?You have more pain, redness, tenderness, or hard swelling over a vein. ?You are uncomfortable because of pain. ?You get a cut in the skin over a varicose vein and it will not stop bleeding. ?Get help right away if: ?You have chest pain. ?You have trouble breathing. ?You have severe leg pain. ?Summary ?Varicose veins are veins that have become enlarged, bulged, and twisted.  They most often appear in the legs. ?This condition is caused by damage to the valves in the vein. These valves help blood return to your heart. ?Treatment for this condition includes frequent movements, wearing compression stockings, losing weight, and exercising regularly. In some cases, procedures are done to close off or remove the veins. ?Nonsurgical treatments to close off the veins include sclerotherapy, laser therapy, and radiofrequency vein ablation. ?This information is not intended to replace advice given to you by your health care provider. Make sure you discuss any questions you have with your health care provider. ?Document Revised: 07/30/2020 Document Reviewed: 07/30/2020 ?Elsevier Patient Education ? 2023 Elsevier Inc. ? ? ? ? ?

## 2021-08-01 ENCOUNTER — Other Ambulatory Visit: Payer: Self-pay

## 2021-08-01 DIAGNOSIS — I872 Venous insufficiency (chronic) (peripheral): Secondary | ICD-10-CM

## 2021-08-06 NOTE — Progress Notes (Deleted)
Office Note     CC:  *** Requesting Provider:  Wynn BankerKoberlein, Junell C, MD  HPI: Jillian Guerrero is a 76 y.o. (11/22/1945) female who presents at the request of Koberlein, Paris LoreJunell C, MD (Inactive) for evaluation of ***.   Venous symptoms include: positive if (X) [  ] aching [  ] heavy [  ] tired  [  ] throbbing [  ] burning  [  ] itching [  ]swelling [  ] bleeding [  ] ulcer  Onset/duration:  ***  Occupation:  *** Aggravating factors: (sitting, standing) Alleviating factors: (elevation) Compression:  *** Helps:  *** Pain medications:  *** Previous vein procedures:  *** History of DVT:  ***   The pt *** on a statin for cholesterol management.  The pt *** on a daily aspirin.   Other AC:  *** The pt *** on *** for hypertension.   The pt *** diabetic.  *** Tobacco hx:  ***  Past Medical History:  Diagnosis Date   GERD (gastroesophageal reflux disease)    Hyperlipidemia    Lumbar compression fracture (HCC)    Osteoporosis    Peripheral neuropathy     Past Surgical History:  Procedure Laterality Date   APPENDECTOMY  1956   BLADDER SUSPENSION  2003   BREAST BIOPSY  2015   hyaluronic acid viscosupplementation Bilateral    knees - 01/16/2018 x2   IR RADIOLOGIST EVAL & MGMT  01/21/2021   KYPHOPLASTY  10/2018   TONSILLECTOMY  1964   UPPER GI ENDOSCOPY  03/08/2019   gastritis    Social History   Socioeconomic History   Marital status: Widowed    Spouse name: Not on file   Number of children: Not on file   Years of education: Not on file   Highest education level: 12th grade  Occupational History   Not on file  Tobacco Use   Smoking status: Former    Packs/day: 1.00    Years: 40.00    Total pack years: 40.00    Types: Cigarettes   Smokeless tobacco: Never  Vaping Use   Vaping Use: Never used  Substance and Sexual Activity   Alcohol use: Never   Drug use: Never   Sexual activity: Not on file  Other Topics Concern   Not on file  Social History  Narrative   Right handed    Lives alone    Social Determinants of Health   Financial Resource Strain: Low Risk  (02/10/2021)   Overall Financial Resource Strain (CARDIA)    Difficulty of Paying Living Expenses: Not hard at all  Food Insecurity: No Food Insecurity (02/10/2021)   Hunger Vital Sign    Worried About Running Out of Food in the Last Year: Never true    Ran Out of Food in the Last Year: Never true  Transportation Needs: No Transportation Needs (02/10/2021)   PRAPARE - Administrator, Civil ServiceTransportation    Lack of Transportation (Medical): No    Lack of Transportation (Non-Medical): No  Physical Activity: Inactive (02/10/2021)   Exercise Vital Sign    Days of Exercise per Week: 0 days    Minutes of Exercise per Session: 0 min  Stress: No Stress Concern Present (02/10/2021)   [employer] - Occupational Stress Questionnaire    Feeling of Stress : Not at all  Social Connections: Moderately Isolated (02/10/2021)   Social Connection and Isolation Panel [NHANES]    Frequency of Communication with Friends and Family: More than three times  a week    Frequency of Social Gatherings with Friends and Family: More than three times a week    Attends Religious Services: More than 4 times per year    Active Member of Clubs or Organizations: No    Attends Banker Meetings: Never    Marital Status: Widowed  Intimate Partner Violence: Not At Risk (10/15/2020)   Humiliation, Afraid, Rape, and Kick questionnaire    Fear of Current or Ex-Partner: No    Emotionally Abused: No    Physically Abused: No    Sexually Abused: No   *** Family History  Problem Relation Age of Onset   Arthritis Mother    Heart disease Mother    Arthritis Father    Depression Father    Arthritis Brother    High blood pressure Brother    High Cholesterol Brother    Arthritis Maternal Grandfather    Asthma Paternal Grandmother    Depression Paternal Grandmother    Heart disease Paternal  Grandmother    High Cholesterol Paternal Grandmother    Arthritis Son    Heart attack Son    High Cholesterol Son    Arthritis Daughter     Current Outpatient Medications  Medication Sig Dispense Refill   albuterol (VENTOLIN HFA) 108 (90 Base) MCG/ACT inhaler Inhale 2 puffs into the lungs every 6 (six) hours as needed for wheezing or shortness of breath. 8 g 1   alendronate (FOSAMAX) 70 MG tablet TAKE 1 TABLET (70 MG TOTAL) BY MOUTH EVERY 7 (SEVEN) DAYS. TAKE WITH A FULL GLASS OF WATER ON AN EMPTY STOMACH. 12 tablet 3   atorvastatin (LIPITOR) 10 MG tablet TAKE 1 TABLET EVERY DAY 90 tablet 3   calcium citrate-vitamin D (CITRACAL+D) 315-200 MG-UNIT tablet Take 1 tablet by mouth 2 (two) times daily.     cetirizine (ZYRTEC) 10 MG tablet Take 10 mg by mouth daily.     gabapentin (NEURONTIN) 600 MG tablet TAKE 1 TABLET TWICE DAILY 180 tablet 3   pantoprazole (PROTONIX) 40 MG tablet TAKE 1 TABLET EVERY DAY 90 tablet 3   valACYclovir (VALTREX) 1000 MG tablet Take 1 tablet (1,000 mg total) by mouth daily. 30 tablet 2   No current facility-administered medications for this visit.    Allergies  Allergen Reactions   Collagen     Redness, swelling     REVIEW OF SYSTEMS:  *** [X]  denotes positive finding, [ ]  denotes negative finding Cardiac  Comments:  Chest pain or chest pressure:    Shortness of breath upon exertion:    Short of breath when lying flat:    Irregular heart rhythm:        Vascular    Pain in calf, thigh, or hip brought on by ambulation:    Pain in feet at night that wakes you up from your sleep:     Blood clot in your veins:    Leg swelling:         Pulmonary    Oxygen at home:    Productive cough:     Wheezing:         Neurologic    Sudden weakness in arms or legs:     Sudden numbness in arms or legs:     Sudden onset of difficulty speaking or slurred speech:    Temporary loss of vision in one eye:     Problems with dizziness:         Gastrointestinal     Blood in  stool:     Vomited blood:         Genitourinary    Burning when urinating:     Blood in urine:        Psychiatric    Major depression:         Hematologic    Bleeding problems:    Problems with blood clotting too easily:        Skin    Rashes or ulcers:        Constitutional    Fever or chills:      PHYSICAL EXAMINATION:  There were no vitals filed for this visit.  General:  WDWN in NAD; vital signs documented above Gait: Not observed HENT: WNL, normocephalic Pulmonary: normal non-labored breathing , without Rales, rhonchi,  wheezing Cardiac: {Desc; regular/irreg:14544} HR, without  Murmurs {With/Without:20273} carotid bruit*** Abdomen: soft, NT, no masses Skin: {With/Without:20273} rashes Vascular Exam/Pulses:  Right Left  Radial {Exam; arterial pulse strength 0-4:30167} {Exam; arterial pulse strength 0-4:30167}  Ulnar {Exam; arterial pulse strength 0-4:30167} {Exam; arterial pulse strength 0-4:30167}  Femoral {Exam; arterial pulse strength 0-4:30167} {Exam; arterial pulse strength 0-4:30167}  Popliteal {Exam; arterial pulse strength 0-4:30167} {Exam; arterial pulse strength 0-4:30167}  DP {Exam; arterial pulse strength 0-4:30167} {Exam; arterial pulse strength 0-4:30167}  PT {Exam; arterial pulse strength 0-4:30167} {Exam; arterial pulse strength 0-4:30167}   Extremities: {With/Without:20273} ischemic changes, {With/Without:20273} Gangrene , {With/Without:20273} cellulitis; {With/Without:20273} open wounds;  Musculoskeletal: no muscle wasting or atrophy  Neurologic: A&O X 3;  No focal weakness or paresthesias are detected Psychiatric:  The pt has {Desc; normal/abnormal:11317::"Normal"} affect.   Non-Invasive Vascular Imaging:   ***    ASSESSMENT/PLAN:: 76 y.o. female presenting with ***   ***   Victorino Sparrow, MD [facility] [phone]

## 2021-08-07 ENCOUNTER — Encounter: Payer: Medicare Other | Admitting: Vascular Surgery

## 2021-08-07 ENCOUNTER — Encounter (HOSPITAL_COMMUNITY): Payer: Medicare Other

## 2021-08-11 ENCOUNTER — Ambulatory Visit (HOSPITAL_COMMUNITY)
Admission: RE | Admit: 2021-08-11 | Discharge: 2021-08-11 | Disposition: A | Discharge status: Home / Self Care | Payer: Medicare Other | Source: Ambulatory Visit | Attending: Vascular Surgery | Admitting: Vascular Surgery

## 2021-08-11 DIAGNOSIS — I872 Venous insufficiency (chronic) (peripheral): Secondary | ICD-10-CM

## 2021-08-12 ENCOUNTER — Encounter: Payer: Self-pay | Admitting: Vascular Surgery

## 2021-08-12 ENCOUNTER — Ambulatory Visit (INDEPENDENT_AMBULATORY_CARE_PROVIDER_SITE_OTHER): Payer: Medicare Other | Admitting: Vascular Surgery

## 2021-08-12 VITALS — BP 112/64 | HR 71 | Temp 97.6°F | Resp 18 | Ht 67.0 in | Wt 198.8 lb

## 2021-08-12 DIAGNOSIS — I83813 Varicose veins of bilateral lower extremities with pain: Secondary | ICD-10-CM

## 2021-08-12 DIAGNOSIS — I872 Venous insufficiency (chronic) (peripheral): Secondary | ICD-10-CM

## 2021-08-12 NOTE — Progress Notes (Signed)
ASSESSMENT & PLAN   CHRONIC VENOUS INSUFFICIENCY: This patient has CEAP C4 venous disease with some hyperpigmentation.  She has had multiple bleeding episodes from some spider veins.  She does have superficial venous reflux only.  I recommended that she elevate her legs and we have discussed the proper positioning for this.  We have also fitted her for some thigh-high compression stockings with a gradient of 20 to 30 mmHg.  We discussed the importance of exercise specifically walking and water aerobics.  I encouraged her to avoid prolonged sitting and standing.  I will plan on seeing her back in 3 months.  If her symptoms have not improved and I think she would be a candidate for laser ablation of the right great saphenous vein and sclerotherapy for the telangiectasias which have bled multiple times.  REASON FOR CONSULT:    Varicose veins bilaterally.  The consult is requested by Dr. Betty [place].  HPI:   Jillian Guerrero is a 76 y.o. female who presents with chronic venous disease.  She describes aching pain and heaviness in both legs which is aggravated by standing and sitting and relieved with elevation.  Her symptoms are relieved with ambulation also.  She has had some small spider veins and has had problems with these bleeding if she gets scratched.  She had multiple episodes of bleeding associated with her spider veins.  She had no previous venous procedures.  She currently does not wear compression stockings.  She does elevate her legs some.  She has had no previous history of DVT.  Past Medical History:  Diagnosis Date   GERD (gastroesophageal reflux disease)    Hyperlipidemia    Lumbar compression fracture (HCC)    Osteoporosis    Peripheral neuropathy     Family History  Problem Relation Age of Onset   Arthritis Mother    Heart disease Mother    Arthritis Father    Depression Father    Arthritis Brother    High blood pressure Brother    High Cholesterol Brother    Arthritis  Maternal Grandfather    Asthma Paternal Grandmother    Depression Paternal Grandmother    Heart disease Paternal Grandmother    High Cholesterol Paternal Grandmother    Arthritis Son    Heart attack Son    High Cholesterol Son    Arthritis Daughter     SOCIAL HISTORY: Social History   Tobacco Use   Smoking status: Former    Packs/day: 1.00    Years: 40.00    Total pack years: 40.00    Types: Cigarettes   Smokeless tobacco: Never  Substance Use Topics   Alcohol use: Never    Allergies  Allergen Reactions   Collagen     Redness, swelling    Current Outpatient Medications  Medication Sig Dispense Refill   alendronate (FOSAMAX) 70 MG tablet TAKE 1 TABLET (70 MG TOTAL) BY MOUTH EVERY 7 (SEVEN) DAYS. TAKE WITH A FULL GLASS OF WATER ON AN EMPTY STOMACH. 12 tablet 3   atorvastatin (LIPITOR) 10 MG tablet TAKE 1 TABLET EVERY DAY 90 tablet 3   calcium citrate-vitamin D (CITRACAL+D) 315-200 MG-UNIT tablet Take 1 tablet by mouth 2 (two) times daily.     cetirizine (ZYRTEC) 10 MG tablet Take 10 mg by mouth daily.     gabapentin (NEURONTIN) 600 MG tablet TAKE 1 TABLET TWICE DAILY 180 tablet 3   pantoprazole (PROTONIX) 40 MG tablet TAKE 1 TABLET EVERY DAY 90 tablet 3  valACYclovir (VALTREX) 1000 MG tablet Take 1 tablet (1,000 mg total) by mouth daily. 30 tablet 2   albuterol (VENTOLIN HFA) 108 (90 Base) MCG/ACT inhaler Inhale 2 puffs into the lungs every 6 (six) hours as needed for wheezing or shortness of breath. 8 g 1   No current facility-administered medications for this visit.    REVIEW OF SYSTEMS:  [X]  denotes positive finding, [ ]  denotes negative finding Cardiac  Comments:  Chest pain or chest pressure:    Shortness of breath upon exertion:    Short of breath when lying flat:    Irregular heart rhythm:        Vascular    Pain in calf, thigh, or hip brought on by ambulation: x   Pain in feet at night that wakes you up from your sleep:  x   Blood clot in your veins:     Leg swelling:         Pulmonary    Oxygen at home:    Productive cough:     Wheezing:         Neurologic    Sudden weakness in arms or legs:     Sudden numbness in arms or legs:     Sudden onset of difficulty speaking or slurred speech:    Temporary loss of vision in one eye:     Problems with dizziness:         Gastrointestinal    Blood in stool:     Vomited blood:         Genitourinary    Burning when urinating:     Blood in urine:        Psychiatric    Major depression:         Hematologic    Bleeding problems:    Problems with blood clotting too easily:        Skin    Rashes or ulcers:        Constitutional    Fever or chills:    -  PHYSICAL EXAM:   Vitals:   08/12/21 0912  BP: 112/64  Pulse: 71  Resp: 18  Temp: 97.6 F (36.4 C)  TempSrc: Temporal  SpO2: 96%  Weight: 198 lb 12.8 oz (90.2 kg)  Height: 5\' 7"  (1.702 m)   Body mass index is 31.14 kg/m. GENERAL: The patient is a well-nourished female, in no acute distress. The vital signs are documented above. CARDIAC: There is a regular rate and rhythm.  VASCULAR: I do not detect carotid bruits. She has palpable posterior tibial pulses bilaterally. She has some mild hyperpigmentation and some spider veins bilaterally as documented in the photographs below.     I did look at the right great saphenous vein myself with the SonoSite and the vein is dilated down to the mid to distal thigh.  Diameters range from 5 to 6 mm.  I would likely cannulate the vein in the mid to distal thigh. PULMONARY: There is good air exchange bilaterally without wheezing or rales. ABDOMEN: Soft and non-tender with normal pitched bowel sounds.  MUSCULOSKELETAL: There are no major deformities. NEUROLOGIC: No focal weakness or paresthesias are detected. SKIN: There are no ulcers or rashes noted. PSYCHIATRIC: The patient has a normal affect.  DATA:    VENOUS DUPLEX: I have independently interpreted her venous duplex scan  that was done yesterday.  This was of the right lower extremity only.  There was no evidence of DVT.  There was no deep venous  reflux.  There was superficial venous reflux in the right great saphenous vein from the saphenofemoral junction to the mid thigh.  Diameters of the vein ranged from 5 to 6 mm.  There was minimal reflux in the great saphenous vein in the calf and in the small saphenous vein.     Jillian Guerrero

## 2021-10-09 ENCOUNTER — Telehealth: Payer: Self-pay | Admitting: Family Medicine

## 2021-10-09 NOTE — Telephone Encounter (Signed)
error 

## 2021-10-16 ENCOUNTER — Ambulatory Visit (INDEPENDENT_AMBULATORY_CARE_PROVIDER_SITE_OTHER): Payer: Medicare Other

## 2021-10-16 VITALS — Ht 67.0 in | Wt 198.0 lb

## 2021-10-16 DIAGNOSIS — Z0000 Encounter for general adult medical examination without abnormal findings: Secondary | ICD-10-CM

## 2021-10-16 NOTE — Patient Instructions (Addendum)
Jillian Guerrero , Thank you for taking time to come for your Medicare Wellness Visit. I appreciate your ongoing commitment to your health goals. Please review the following plan we discussed and let me know if I can assist you in the future.   These are the goals we discussed:  Goals       Travel (pt-stated)      Travel more        This is a list of the screening recommended for you and due dates:  Health Maintenance  Topic Date Due   Flu Shot  09/29/2021   COVID-19 Vaccine (4 - Moderna series) 11/01/2021*   Zoster (Shingles) Vaccine (2 of 2) 03/05/2022*   Tetanus Vaccine  01/03/2024   Pneumonia Vaccine  Completed   DEXA scan (bone density measurement)  Completed   Hepatitis C Screening: USPSTF Recommendation to screen - Ages 23-79 yo.  Completed   HPV Vaccine  Aged Out   Colon Cancer Screening  Discontinued  *Topic was postponed. The date shown is not the original due date.   Advanced directives: Yes  Conditions/risks identified: No  Next appointment: Follow up in one year for your annual wellness visit    Preventive Care 65 Years and Older, Female Preventive care refers to lifestyle choices and visits with your health care provider that can promote health and wellness. What does preventive care include? A yearly physical exam. This is also called an annual well check. Dental exams once or twice a year. Routine eye exams. Ask your health care provider how often you should have your eyes checked. Personal lifestyle choices, including: Daily care of your teeth and gums. Regular physical activity. Eating a healthy diet. Avoiding tobacco and drug use. Limiting alcohol use. Practicing safe sex. Taking low-dose aspirin every day. Taking vitamin and mineral supplements as recommended by your health care provider. What happens during an annual well check? The services and screenings done by your health care provider during your annual well check will depend on your age, overall  health, lifestyle risk factors, and family history of disease. Counseling  Your health care provider may ask you questions about your: Alcohol use. Tobacco use. Drug use. Emotional well-being. Home and relationship well-being. Sexual activity. Eating habits. History of falls. Memory and ability to understand (cognition). Work and work Astronomer. Reproductive health. Screening  You may have the following tests or measurements: Height, weight, and BMI. Blood pressure. Lipid and cholesterol levels. These may be checked every 5 years, or more frequently if you are over 39 years old. Skin check. Lung cancer screening. You may have this screening every year starting at age 46 if you have a 30-pack-year history of smoking and currently smoke or have quit within the past 15 years. Fecal occult blood test (FOBT) of the stool. You may have this test every year starting at age 45. Flexible sigmoidoscopy or colonoscopy. You may have a sigmoidoscopy every 5 years or a colonoscopy every 10 years starting at age 57. Hepatitis C blood test. Hepatitis B blood test. Sexually transmitted disease (STD) testing. Diabetes screening. This is done by checking your blood sugar (glucose) after you have not eaten for a while (fasting). You may have this done every 1-3 years. Bone density scan. This is done to screen for osteoporosis. You may have this done starting at age 77. Mammogram. This may be done every 1-2 years. Talk to your health care provider about how often you should have regular mammograms. Talk with your health care  provider about your test results, treatment options, and if necessary, the need for more tests. Vaccines  Your health care provider may recommend certain vaccines, such as: Influenza vaccine. This is recommended every year. Tetanus, diphtheria, and acellular pertussis (Tdap, Td) vaccine. You may need a Td booster every 10 years. Zoster vaccine. You may need this after age  82. Pneumococcal 13-valent conjugate (PCV13) vaccine. One dose is recommended after age 79. Pneumococcal polysaccharide (PPSV23) vaccine. One dose is recommended after age 51. Talk to your health care provider about which screenings and vaccines you need and how often you need them. This information is not intended to replace advice given to you by your health care provider. Make sure you discuss any questions you have with your health care provider. Document Released: 03/14/2015 Document Revised: 11/05/2015 Document Reviewed: 12/17/2014 Elsevier Interactive Patient Education  2017 [place] Prevention in the Home Falls can cause injuries. They can happen to people of all ages. There are many things you can do to make your home safe and to help prevent falls. What can I do on the outside of my home? Regularly fix the edges of walkways and driveways and fix any cracks. Remove anything that might make you trip as you walk through a door, such as a raised step or threshold. Trim any bushes or trees on the path to your home. Use bright outdoor lighting. Clear any walking paths of anything that might make someone trip, such as rocks or tools. Regularly check to see if handrails are loose or broken. Make sure that both sides of any steps have handrails. Any raised decks and porches should have guardrails on the edges. Have any leaves, snow, or ice cleared regularly. Use sand or salt on walking paths during winter. Clean up any spills in your garage right away. This includes oil or grease spills. What can I do in the bathroom? Use night lights. Install grab bars by the toilet and in the tub and shower. Do not use towel bars as grab bars. Use non-skid mats or decals in the tub or shower. If you need to sit down in the shower, use a plastic, non-slip stool. Keep the floor dry. Clean up any water that spills on the floor as soon as it happens. Remove soap buildup in the tub or shower  regularly. Attach bath mats securely with double-sided non-slip rug tape. Do not have throw rugs and other things on the floor that can make you trip. What can I do in the bedroom? Use night lights. Make sure that you have a light by your bed that is easy to reach. Do not use any sheets or blankets that are too big for your bed. They should not hang down onto the floor. Have a firm chair that has side arms. You can use this for support while you get dressed. Do not have throw rugs and other things on the floor that can make you trip. What can I do in the kitchen? Clean up any spills right away. Avoid walking on wet floors. Keep items that you use a lot in easy-to-reach places. If you need to reach something above you, use a strong step stool that has a grab bar. Keep electrical cords out of the way. Do not use floor polish or wax that makes floors slippery. If you must use wax, use non-skid floor wax. Do not have throw rugs and other things on the floor that can make you trip. What can I  do with my stairs? Do not leave any items on the stairs. Make sure that there are handrails on both sides of the stairs and use them. Fix handrails that are broken or loose. Make sure that handrails are as long as the stairways. Check any carpeting to make sure that it is firmly attached to the stairs. Fix any carpet that is loose or worn. Avoid having throw rugs at the top or bottom of the stairs. If you do have throw rugs, attach them to the floor with carpet tape. Make sure that you have a light switch at the top of the stairs and the bottom of the stairs. If you do not have them, ask someone to add them for you. What else can I do to help prevent falls? Wear shoes that: Do not have high heels. Have rubber bottoms. Are comfortable and fit you well. Are closed at the toe. Do not wear sandals. If you use a stepladder: Make sure that it is fully opened. Do not climb a closed stepladder. Make sure that  both sides of the stepladder are locked into place. Ask someone to hold it for you, if possible. Clearly mark and make sure that you can see: Any grab bars or handrails. First and last steps. Where the edge of each step is. Use tools that help you move around (mobility aids) if they are needed. These include: Canes. Walkers. Scooters. Crutches. Turn on the lights when you go into a dark area. Replace any light bulbs as soon as they burn out. Set up your furniture so you have a clear path. Avoid moving your furniture around. If any of your floors are uneven, fix them. If there are any pets around you, be aware of where they are. Review your medicines with your doctor. Some medicines can make you feel dizzy. This can increase your chance of falling. Ask your doctor what other things that you can do to help prevent falls. This information is not intended to replace advice given to you by your health care provider. Make sure you discuss any questions you have with your health care provider. Document Released: 12/12/2008 Document Revised: 07/24/2015 Document Reviewed: 03/22/2014 Elsevier Interactive Patient Education  2017 [employer].

## 2021-10-16 NOTE — Progress Notes (Signed)
Subjective:   Jillian Guerrero is a 76 y.o. female who presents for Medicare Annual (Subsequent) preventive examination.  Review of Systems    Virtual Visit via Telephone Note  I connected with  Jillian Guerrero on 10/16/21 at 12:30 PM EDT by telephone and verified that I am speaking with the correct person using two identifiers.  Location: Patient: Home Provider: Office Persons participating in the virtual visit: patient/Nurse Health Advisor   I discussed the limitations, risks, security and privacy concerns of performing an evaluation and management service by telephone and the availability of in person appointments. The patient expressed understanding and agreed to proceed.  Interactive audio and video telecommunications were attempted between this nurse and patient, however failed, due to patient having technical difficulties OR patient did not have access to video capability.  We continued and completed visit with audio only.  Some vital signs may be absent or patient reported.   Jillian Rung, LPN  Cardiac Risk Factors include: advanced age (>54men, >15 women)     Objective:    Today's Vitals   10/16/21 1241  Weight: 198 lb (89.8 kg)  Height: 5\' 7"  (1.702 m)   Body mass index is 31.01 kg/m.     10/16/2021   12:52 PM 10/15/2020   10:54 AM 05/08/2020    7:57 AM  Advanced Directives  Does Patient Have a Medical Advance Directive? Yes Yes Yes  Type of 07/08/2020 of [place];Living will Healthcare Power of [place];Living will Healthcare Power of [place];Living will;Out of facility DNR (pink MOST or yellow form)  Does patient want to make changes to medical advance directive? No - Patient declined    Copy of Healthcare Power of Attorney in Chart? No - copy requested No - copy requested     Current Medications (verified) Outpatient Encounter Medications as of 10/16/2021  Medication Sig   albuterol (VENTOLIN HFA) 108 (90 Base) MCG/ACT inhaler  Inhale 2 puffs into the lungs every 6 (six) hours as needed for wheezing or shortness of breath.   alendronate (FOSAMAX) 70 MG tablet TAKE 1 TABLET (70 MG TOTAL) BY MOUTH EVERY 7 (SEVEN) DAYS. TAKE WITH A FULL GLASS OF WATER ON AN EMPTY STOMACH.   atorvastatin (LIPITOR) 10 MG tablet TAKE 1 TABLET EVERY DAY   calcium citrate-vitamin D (CITRACAL+D) 315-200 MG-UNIT tablet Take 1 tablet by mouth 2 (two) times daily.   cetirizine (ZYRTEC) 10 MG tablet Take 10 mg by mouth daily.   gabapentin (NEURONTIN) 600 MG tablet TAKE 1 TABLET TWICE DAILY   pantoprazole (PROTONIX) 40 MG tablet TAKE 1 TABLET EVERY DAY   No facility-administered encounter medications on file as of 10/16/2021.    Allergies (verified) Collagen   History: Past Medical History:  Diagnosis Date   GERD (gastroesophageal reflux disease)    Hyperlipidemia    Lumbar compression fracture (HCC)    Osteoporosis    Peripheral neuropathy    Past Surgical History:  Procedure Laterality Date   APPENDECTOMY  1956   BLADDER SUSPENSION  2003   BREAST BIOPSY  2015   hyaluronic acid viscosupplementation Bilateral    knees - 01/16/2018 x2   IR RADIOLOGIST EVAL & MGMT  01/21/2021   KYPHOPLASTY  10/2018   TONSILLECTOMY  1964   UPPER GI ENDOSCOPY  03/08/2019   gastritis   Family History  Problem Relation Age of Onset   Arthritis Mother    Heart disease Mother    Arthritis Father    Depression Father  Arthritis Brother    High blood pressure Brother    High Cholesterol Brother    Arthritis Maternal Grandfather    Asthma Paternal Grandmother    Depression Paternal Grandmother    Heart disease Paternal Grandmother    High Cholesterol Paternal Grandmother    Arthritis Son    Heart attack Son    High Cholesterol Son    Arthritis Daughter    Social History   Socioeconomic History   Marital status: Widowed    Spouse name: Not on file   Number of children: Not on file   Years of education: Not on file   Highest education  level: 12th grade  Occupational History   Not on file  Tobacco Use   Smoking status: Former    Packs/day: 1.00    Years: 40.00    Total pack years: 40.00    Types: Cigarettes   Smokeless tobacco: Never  Vaping Use   Vaping Use: Never used  Substance and Sexual Activity   Alcohol use: Never   Drug use: Never   Sexual activity: Not on file  Other Topics Concern   Not on file  Social History Narrative   Right handed    Lives alone    Social Determinants of Health   Financial Resource Strain: Low Risk  (10/16/2021)   Overall Financial Resource Strain (CARDIA)    Difficulty of Paying Living Expenses: Not hard at all  Food Insecurity: No Food Insecurity (10/16/2021)   Hunger Vital Sign    Worried About Running Out of Food in the Last Year: Never true    Ran Out of Food in the Last Year: Never true  Transportation Needs: No Transportation Needs (10/16/2021)   PRAPARE - Administrator, Civil Service (Medical): No    Lack of Transportation (Non-Medical): No  Physical Activity: Inactive (10/16/2021)   Exercise Vital Sign    Days of Exercise per Week: 0 days    Minutes of Exercise per Session: 0 min  Stress: No Stress Concern Present (10/16/2021)   [employer] of Occupational Health - Occupational Stress Questionnaire    Feeling of Stress : Not at all  Social Connections: Moderately Integrated (10/16/2021)   Social Connection and Isolation Panel [NHANES]    Frequency of Communication with Friends and Family: More than three times a week    Frequency of Social Gatherings with Friends and Family: More than three times a week    Attends Religious Services: More than 4 times per year    Active Member of [employer] or Organizations: Yes    Attends Banker Meetings: More than 4 times per year    Marital Status: Widowed    Tobacco Counseling Counseling given: Not Answered   Clinical Intake:  Pre-visit preparation completed: No  Pain : No/denies pain      BMI - recorded: 31.13 Nutritional Status: BMI > 30  Obese Nutritional Risks: None Diabetes: No  How often do you need to have someone help you when you read instructions, pamphlets, or other written materials from your doctor or pharmacy?: 1 - Never  Diabetic?  No  Interpreter Needed?: No  Information entered by :: Theresa Mulligan LPN   Activities of Daily Living    10/16/2021   12:48 PM  In your present state of health, do you have any difficulty performing the following activities:  Hearing? 0  Vision? 0  Difficulty concentrating or making decisions? 0  Walking or climbing stairs? 0  Dressing  or bathing? 0  Doing errands, shopping? 0  Preparing Food and eating ? N  Using the Toilet? N  In the past six months, have you accidently leaked urine? N  Do you have problems with loss of bowel control? N  Managing your Medications? N  Managing your Finances? N  Housekeeping or managing your Housekeeping? N    Patient Care Team: Wynn Banker, MD (Inactive) as PCP - General (Family Medicine) Glendale Chard, DO as Consulting Physician (Neurology)  Indicate any recent Medical Services you may have received from other than [facility] providers in the past year (date may be approximate).     Assessment:   This is a routine wellness examination for Evely.  Hearing/Vision screen Hearing Screening - Comments:: No hearing difficulty Vision Screening - Comments:: Wears reading glasses. Followed by Dr Jenene Slicker  Dietary issues and exercise activities discussed: Exercise limited by: None identified   Goals Addressed               This Visit's Progress     Travel (pt-stated)        Travel more       Depression Screen    10/16/2021   12:46 PM 10/15/2020   10:56 AM 10/15/2020   10:52 AM 09/26/2020    2:41 PM  PHQ 2/9 Scores  PHQ - 2 Score 0 0 0 0  PHQ- 9 Score    0    Fall Risk    10/16/2021   12:51 PM 02/10/2021   12:59 PM 10/15/2020   10:55 AM 05/08/2020    7:57  AM  Fall Risk   Falls in the past year? 0 0 0 0  Number falls in past yr: 0  0 0  Injury with Fall? 0  0 0  Risk for fall due to : No Fall Risks  No Fall Risks   Follow up   Falls evaluation completed     FALL RISK PREVENTION PERTAINING TO THE HOME:  Any stairs in or around the home? Yes  If so, are there any without handrails? No  Home free of loose throw rugs in walkways, pet beds, electrical cords, etc? Yes  Adequate lighting in your home to reduce risk of falls? Yes   ASSISTIVE DEVICES UTILIZED TO PREVENT FALLS:  Life alert? No  Use of a cane, walker or w/c? No  Grab bars in the bathroom? Yes  Shower chair or bench in shower? Yes  Elevated toilet seat or a handicapped toilet? Yes  TIMED UP AND GO:  Was the test performed? No . Audio Visit  Cognitive Function:        10/16/2021   12:52 PM  6CIT Screen  What Year? 0 points  What month? 0 points  What time? 0 points  Count back from 20 0 points  Months in reverse 0 points  Repeat phrase 0 points  Total Score 0 points    Immunizations Immunization History  Administered Date(s) Administered   Hepatitis A 01/02/2014   Hepatitis A, Adult 01/02/2014, 02/11/2014   Influenza-Unspecified 02/22/2020   Moderna Sars-Covid-2 Vaccination 06/12/2019, 07/11/2019, 04/08/2020   Pneumococcal Conjugate-13 12/18/2013   Pneumococcal Polysaccharide-23 09/12/2012   Td 11/08/2013   Tdap 01/02/2014   Typhoid Inactivated 01/02/2014   Typhoid Live 01/02/2014   Yellow Fever 01/02/2014   Zoster Recombinat (Shingrix) 12/12/2020    TDAP status: Up to date  Flu Vaccine status: Up to date  Pneumococcal vaccine status: Up to date  Covid-19 vaccine status:  Completed vaccines  Qualifies for Shingles Vaccine? Yes   Zostavax completed Yes   Shingrix Completed?: Yes  Screening Tests Health Maintenance  Topic Date Due   INFLUENZA VACCINE  09/29/2021   COVID-19 Vaccine (4 - Moderna series) 11/01/2021 (Originally 06/03/2020)    Zoster Vaccines- Shingrix (2 of 2) 03/05/2022 (Originally 02/06/2021)   TETANUS/TDAP  01/03/2024   Pneumonia Vaccine 56+ Years old  Completed   DEXA SCAN  Completed   Hepatitis C Screening  Completed   HPV VACCINES  Aged Out   COLONOSCOPY (Pts 45-35yrs Insurance coverage will need to be confirmed)  Discontinued    Health Maintenance  Health Maintenance Due  Topic Date Due   INFLUENZA VACCINE  09/29/2021    Colorectal cancer screening: No longer required.   Mammogram status: No longer required due to Age.  Bone Density status: Completed 06/07/20. Results reflect: Bone density results: OSTEOPOROSIS. Repeat every   years.  Lung Cancer Screening: (Low Dose CT Chest recommended if Age 30-80 years, 30 pack-year currently smoking OR have quit w/in 15years.) does not qualify.  Additional Screening:  Hepatitis C Screening: does qualify; Completed 11/30/19  Vision Screening: Recommended annual ophthalmology exams for early detection of glaucoma and other disorders of the eye. Is the patient up to date with their annual eye exam?  Yes  Who is the provider or what is the name of the office in which the patient attends annual eye exams?  Dr Baxter Hire If pt is not established with a provider, would they like to be referred to a provider to establish care? No .   Dental Screening: Recommended annual dental exams for proper oral hygiene  Community Resource Referral / Chronic Care Management:  CRR required this visit?  No   CCM required this visit?  No      Plan:     I have personally reviewed and noted the following in the patient's chart:   Medical and social history Use of alcohol, tobacco or illicit drugs  Current medications and supplements including opioid prescriptions. Patient is not currently taking opioid prescriptions. Functional ability and status Nutritional status Physical activity Advanced directives List of other physicians Hospitalizations, surgeries, and ER visits in  previous 12 months Vitals Screenings to include cognitive, depression, and falls Referrals and appointments  In addition, I have reviewed and discussed with patient certain preventive protocols, quality metrics, and best practice recommendations. A written personalized care plan for preventive services as well as general preventive health recommendations were provided to patient.     Jillian Rung, LPN   4/48/1856   Nurse Notes: None

## 2021-11-10 ENCOUNTER — Ambulatory Visit (INDEPENDENT_AMBULATORY_CARE_PROVIDER_SITE_OTHER): Payer: Medicare Other | Admitting: Family Medicine

## 2021-11-10 ENCOUNTER — Encounter: Payer: Self-pay | Admitting: Family Medicine

## 2021-11-10 VITALS — BP 112/64 | HR 99 | Temp 98.0°F | Ht 67.0 in | Wt 203.9 lb

## 2021-11-10 DIAGNOSIS — E559 Vitamin D deficiency, unspecified: Secondary | ICD-10-CM | POA: Diagnosis not present

## 2021-11-10 DIAGNOSIS — Z23 Encounter for immunization: Secondary | ICD-10-CM | POA: Diagnosis not present

## 2021-11-10 DIAGNOSIS — M810 Age-related osteoporosis without current pathological fracture: Secondary | ICD-10-CM | POA: Diagnosis not present

## 2021-11-10 DIAGNOSIS — G629 Polyneuropathy, unspecified: Secondary | ICD-10-CM | POA: Insufficient documentation

## 2021-11-10 DIAGNOSIS — G609 Hereditary and idiopathic neuropathy, unspecified: Secondary | ICD-10-CM

## 2021-11-10 DIAGNOSIS — B029 Zoster without complications: Secondary | ICD-10-CM | POA: Diagnosis not present

## 2021-11-10 DIAGNOSIS — E538 Deficiency of other specified B group vitamins: Secondary | ICD-10-CM | POA: Diagnosis not present

## 2021-11-10 DIAGNOSIS — E782 Mixed hyperlipidemia: Secondary | ICD-10-CM | POA: Diagnosis not present

## 2021-11-10 DIAGNOSIS — K219 Gastro-esophageal reflux disease without esophagitis: Secondary | ICD-10-CM

## 2021-11-10 LAB — VITAMIN B12: Vitamin B-12: 300 pg/mL (ref 211–911)

## 2021-11-10 LAB — VITAMIN D 25 HYDROXY (VIT D DEFICIENCY, FRACTURES): VITD: 29.57 ng/mL — ABNORMAL LOW (ref 30.00–100.00)

## 2021-11-10 MED ORDER — TIZANIDINE HCL 4 MG PO TABS: 4.0000 mg | ORAL_TABLET | Freq: Every day | ORAL | 1 refills | Status: DC

## 2021-11-10 MED ORDER — ALENDRONATE SODIUM 70 MG PO TABS: 70.0000 mg | ORAL_TABLET | ORAL | 3 refills | Status: DC

## 2021-11-10 MED ORDER — PANTOPRAZOLE SODIUM 40 MG PO TBEC: 40.0000 mg | DELAYED_RELEASE_TABLET | Freq: Every day | ORAL | 3 refills | Status: DC

## 2021-11-10 MED ORDER — GABAPENTIN 600 MG PO TABS: 600.0000 mg | ORAL_TABLET | Freq: Two times a day (BID) | ORAL | 1 refills | Status: DC

## 2021-11-10 MED ORDER — ATORVASTATIN CALCIUM 10 MG PO TABS: 10.0000 mg | ORAL_TABLET | Freq: Every day | ORAL | 3 refills | Status: DC

## 2021-11-10 MED ORDER — VALACYCLOVIR HCL 1 G PO TABS: 1000.0000 mg | ORAL_TABLET | Freq: Every day | ORAL | 3 refills | Status: DC

## 2021-11-10 NOTE — Progress Notes (Unsigned)
Established Patient Office Visit  Subjective   Patient ID: Jillian Guerrero, female    DOB: 27-Jan-1946  Age: 76 y.o. MRN: [id number]  Chief Complaint  Patient presents with   Establish Care    Leg pain- pt rpeorts this is a chronic issue, states that she was sent to the vascular surgeon and was given compression stockings, she has a follow up appointment with him soon. 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 She is not sure if the pain is coming from her varicose veins, states that for 3 weeks she has had a pain in the down the back of her right leg. I reviewed her MRI of the lumbar spine from last year, she reports that the gabapentin is helping her somewhat with the pain.  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 We discussed taking a daily suppressive therapy and she is agreeable.   I reviewed the patient's current problem list and medications, states she needs refills on most of her medications today.   GERD -- good control with the protonix 40 mg daily.   Vitamin D deficiency-- pt on daily vitamin replacement, needs new level today. Pt also on fosamax for her OP   Current Outpatient Medications  Medication Instructions   albuterol (VENTOLIN HFA) 108 (90 Base) MCG/ACT inhaler 2 puffs, Inhalation, Every 6 hours PRN   alendronate (FOSAMAX) 70 mg, Oral, Every 7 days, Take with a full glass of water on an empty stomach.   atorvastatin (LIPITOR) 10 mg, Oral, Daily   calcium citrate-vitamin D (CITRACAL+D) 315-200 MG-UNIT tablet 1 tablet, Oral, 2 times daily   cetirizine (ZYRTEC) 10 mg, Oral, Daily   gabapentin (NEURONTIN) 600 mg, Oral, 2 times daily   pantoprazole (PROTONIX) 40 mg, Oral, Daily   valACYclovir (VALTREX) 1,000 mg, Oral, Daily    {History (Optional):23778}  Review of Systems  All other systems reviewed and are  negative.     Objective:     BP 112/64 (BP Location: Left Arm, Patient Position: Sitting, Cuff Size: Large)   Pulse 99   Temp 98 F (36.7 C) (Oral)   Ht 5\' 7"  (1.702 m)   Wt 203 lb 14.4 oz (92.5 kg)   SpO2 98%   BMI 31.94 kg/m  {Vitals History (Optional):23777}  Physical Exam Vitals reviewed.  Constitutional:      Appearance: Normal appearance. She is well-groomed and normal weight.  Eyes:     Extraocular Movements: Extraocular movements intact.     Conjunctiva/sclera: Conjunctivae normal.     Pupils: Pupils are equal, round, and reactive to light.  Cardiovascular:     Rate and Rhythm: Normal rate and regular rhythm.     Pulses: Normal pulses.     Heart sounds: S1 normal and S2 normal.  Pulmonary:     Effort: Pulmonary effort is normal.     Breath sounds: Normal breath sounds and air entry.  Abdominal:     General: Bowel sounds are normal.  Musculoskeletal:        General: Normal range of motion.     Right lower leg: No edema.     Left lower leg: No edema.  Skin:    General: Skin is warm and dry.  Neurological:     Mental Status: She is alert and oriented to person, place, and time. Mental status is at baseline.  Gait: Gait is intact.  Psychiatric:        Mood and Affect: Mood and affect normal.        Speech: Speech normal.        Behavior: Behavior normal.        Judgment: Judgment normal.      No results found for any visits on 11/10/21.  {Labs (Optional):23779}  The 10-year ASCVD risk score (Arnett DK, et al., 2019) is: 13.8%    Assessment & Plan:   Problem List Items Addressed This Visit       Digestive   GERD (gastroesophageal reflux disease)   Relevant Medications   pantoprazole (PROTONIX) 40 MG tablet     Nervous and Auditory   Peripheral neuropathy (Chronic)   Relevant Medications   gabapentin (NEURONTIN) 600 MG tablet     Musculoskeletal and Integument   Osteoporosis   Relevant Medications   alendronate (FOSAMAX) 70 MG tablet      Other   Hyperlipidemia   Relevant Medications   atorvastatin (LIPITOR) 10 MG tablet   Vitamin D deficiency - Primary   Relevant Orders   Vitamin D, 25-hydroxy   Herpes zoster (Chronic)    Patient reports recurrent outbreaks, states that it went away while she was on the antiviral medication, however when she stopped it the outbreak returned.      Relevant Medications   valACYclovir (VALTREX) 1000 MG tablet   Other Visit Diagnoses     Vitamin B12 deficiency       Relevant Orders   Vitamin B12       No follow-ups on file.    Karie Georges, MD

## 2021-11-10 NOTE — Assessment & Plan Note (Signed)
Patient reports recurrent outbreaks, states that it went away while she was on the antiviral medication, however when she stopped it the outbreak returned.

## 2021-11-11 NOTE — Progress Notes (Signed)
Vitamin D is slightly low, I recommend she take 2000 units daily over the counter of vitamin D

## 2021-11-13 NOTE — Assessment & Plan Note (Signed)
CVD risk assessed this visit, she is on 10 mg daily of lipitor, will continue this medication.

## 2021-11-13 NOTE — Assessment & Plan Note (Signed)
On fosamax 70 mg weekly, no side effects reported today, will refill this medication.

## 2021-11-13 NOTE — Assessment & Plan Note (Signed)
On supplements, last level was low, will get new level today.

## 2021-11-13 NOTE — Assessment & Plan Note (Addendum)
Still with chronic leg pain, continue the gabapentin 600 mg BID which is helping some with the pain, I extensively reviewed her documentation, EMG, etc. She is currently undergoing evaluation by the vascular specialists to determine if her pain is from her varicose veins.

## 2021-11-13 NOTE — Assessment & Plan Note (Signed)
Stable sx on protonix 40 mg daily, continue this medication, refills placed today

## 2021-11-18 ENCOUNTER — Ambulatory Visit (INDEPENDENT_AMBULATORY_CARE_PROVIDER_SITE_OTHER): Payer: Medicare Other | Admitting: Vascular Surgery

## 2021-11-18 ENCOUNTER — Other Ambulatory Visit (HOSPITAL_COMMUNITY): Payer: Self-pay | Admitting: Vascular Surgery

## 2021-11-18 ENCOUNTER — Encounter: Payer: Self-pay | Admitting: Vascular Surgery

## 2021-11-18 ENCOUNTER — Ambulatory Visit (HOSPITAL_COMMUNITY)
Admission: RE | Admit: 2021-11-18 | Discharge: 2021-11-18 | Disposition: A | Discharge status: Home / Self Care | Payer: Medicare Other | Source: Ambulatory Visit | Attending: Vascular Surgery | Admitting: Vascular Surgery

## 2021-11-18 VITALS — BP 127/84 | HR 84 | Temp 97.4°F | Resp 16 | Ht 67.0 in | Wt 199.0 lb

## 2021-11-18 DIAGNOSIS — I83813 Varicose veins of bilateral lower extremities with pain: Secondary | ICD-10-CM

## 2021-11-18 DIAGNOSIS — I8393 Asymptomatic varicose veins of bilateral lower extremities: Secondary | ICD-10-CM

## 2021-11-18 DIAGNOSIS — M7121 Synovial cyst of popliteal space [Baker], right knee: Secondary | ICD-10-CM

## 2021-11-18 DIAGNOSIS — I872 Venous insufficiency (chronic) (peripheral): Secondary | ICD-10-CM | POA: Diagnosis not present

## 2021-11-18 DIAGNOSIS — M79604 Pain in right leg: Secondary | ICD-10-CM | POA: Diagnosis present

## 2021-11-18 NOTE — Progress Notes (Signed)
REASON FOR VISIT:   Follow-up of chronic venous insufficiency.  MEDICAL ISSUES:   CHRONIC VENOUS INSUFFICIENCY: The patient states that her compression stockings have helped with her symptoms from venous hypertension.  However she was complaining of significant pain from just above her knee down to her lower leg on the right and for this reason we obtained a venous duplex scan to rule out DVT.  She did not have a DVT but she did have a Baker's cyst which is likely the source of her symptoms.  She is having significant pain from this so she plans on contacting her orthopedic doctor to make an appointment.  For now we will hold off on any plans for laser ablation of the right great saphenous vein.  She will continue to elevate her legs and wear her compression stockings which she has been doing.  I reassured her that she had no evidence of DVT.  I will plan on seeing her back in 6 months.  She knows to call sooner if she has problems.    HPI:   Jillian Guerrero is a pleasant 76 y.o. female who I saw on 08/12/2021 with chronic venous disease.  She had multiple bleeding episodes from some spider veins on her right leg.  She had CEAP C4 venous disease.  At that time we discussed conservative measures including importance of leg elevation, exercise, and trying to avoid prolonged sitting and standing.  She was fitted for thigh-high compression stockings with a gradient of 20 to 30 mmHg.  She comes in for 49-month follow-up visit.  Since I saw her last, about 3 weeks ago she began having pain from just above her knee which extends down to her lower leg.  She does not describe significant swelling associated with this but significant pain.  This pain is fairly constant and is interfering with her ability to drive.  She did fly to [place] in July and this was a 12-hour flight.  She has had no further bleeding episodes.  She does think that her thigh-high compression stockings help with her symptoms that she  was having secondary to venous hypertension.  Past Medical History:  Diagnosis Date   GERD (gastroesophageal reflux disease)    Hyperlipidemia    Lumbar compression fracture (HCC)    Osteoporosis    Peripheral neuropathy     Family History  Problem Relation Age of Onset   Arthritis Mother    Heart disease Mother    Arthritis Father    Depression Father    Arthritis Brother    High blood pressure Brother    High Cholesterol Brother    Arthritis Maternal Grandfather    Asthma Paternal Grandmother    Depression Paternal Grandmother    Heart disease Paternal Grandmother    High Cholesterol Paternal Grandmother    Arthritis Son    Heart attack Son    High Cholesterol Son    Arthritis Daughter     SOCIAL HISTORY: Social History   Tobacco Use   Smoking status: Former    Packs/day: 1.00    Years: 40.00    Total pack years: 40.00    Types: Cigarettes   Smokeless tobacco: Never  Substance Use Topics   Alcohol use: Never    Allergies  Allergen Reactions   Collagen     Redness, swelling    Current Outpatient Medications  Medication Sig Dispense Refill   albuterol (VENTOLIN HFA) 108 (90 Base) MCG/ACT inhaler Inhale 2 puffs into  the lungs every 6 (six) hours as needed for wheezing or shortness of breath. 8 g 1   alendronate (FOSAMAX) 70 MG tablet Take 1 tablet (70 mg total) by mouth every 7 (seven) days. Take with a full glass of water on an empty stomach. 12 tablet 3   atorvastatin (LIPITOR) 10 MG tablet Take 1 tablet (10 mg total) by mouth daily. 90 tablet 3   calcium citrate-vitamin D (CITRACAL+D) 315-200 MG-UNIT tablet Take 1 tablet by mouth 2 (two) times daily.     cetirizine (ZYRTEC) 10 MG tablet Take 10 mg by mouth daily.     gabapentin (NEURONTIN) 600 MG tablet Take 1 tablet (600 mg total) by mouth 2 (two) times daily. 180 tablet 1   pantoprazole (PROTONIX) 40 MG tablet Take 1 tablet (40 mg total) by mouth daily. 90 tablet 3   tiZANidine (ZANAFLEX) 4 MG tablet  Take 1 tablet (4 mg total) by mouth at bedtime. 90 tablet 1   valACYclovir (VALTREX) 1000 MG tablet Take 1 tablet (1,000 mg total) by mouth daily. 90 tablet 3   No current facility-administered medications for this visit.    REVIEW OF SYSTEMS:  [X]  denotes positive finding, [ ]  denotes negative finding Cardiac  Comments:  Chest pain or chest pressure:    Shortness of breath upon exertion:    Short of breath when lying flat:    Irregular heart rhythm:        Vascular    Pain in calf, thigh, or hip brought on by ambulation:    Pain in feet at night that wakes you up from your sleep:     Blood clot in your veins:    Leg swelling:  x       Pulmonary    Oxygen at home:    Productive cough:     Wheezing:         Neurologic    Sudden weakness in arms or legs:     Sudden numbness in arms or legs:     Sudden onset of difficulty speaking or slurred speech:    Temporary loss of vision in one eye:     Problems with dizziness:         Gastrointestinal    Blood in stool:     Vomited blood:         Genitourinary    Burning when urinating:     Blood in urine:        Psychiatric    Major depression:         Hematologic    Bleeding problems:    Problems with blood clotting too easily:        Skin    Rashes or ulcers:        Constitutional    Fever or chills:     PHYSICAL EXAM:   Vitals:   11/18/21 1323  BP: 127/84  Pulse: 84  Resp: 16  Temp: (!) 97.4 F (36.3 C)  TempSrc: Temporal  SpO2: 97%  Weight: 199 lb (90.3 kg)  Height: 5\' 7"  (1.702 m)   GENERAL: The patient is a well-nourished female, in no acute distress. The vital signs are documented above. CARDIAC: There is a regular rate and rhythm.  VASCULAR: I do not detect carotid bruits. She has palpable dorsalis pedis pulses bilaterally. She has some telangiectasias bilaterally. I did look at her right great saphenous vein myself with the SonoSite and the vein does have reflux down to the mid to distal  thigh.   There is been no significant change in the diameter of the vein since the last visit.  The vein is compressible with no evidence of phlebitis. PULMONARY: There is good air exchange bilaterally without wheezing or rales. ABDOMEN: Soft and non-tender with normal pitched bowel sounds.  MUSCULOSKELETAL: There are no major deformities or cyanosis. NEUROLOGIC: No focal weakness or paresthesias are detected. SKIN: There are no ulcers or rashes noted. PSYCHIATRIC: The patient has a normal affect.  DATA:    VENOUS DUPLEX: I have independently interpreted her venous duplex scan today.  This was of the right lower extremity only.  There was no evidence of DVT in the right lower extremity.  There was evidence of a Baker's cyst behind the right popliteal space which measured 1.6 cm x 2.6 cm.  Deitra [facility] [phone]

## 2021-11-19 ENCOUNTER — Other Ambulatory Visit: Payer: Self-pay

## 2021-11-19 DIAGNOSIS — I824Y1 Acute embolism and thrombosis of unspecified deep veins of right proximal lower extremity: Secondary | ICD-10-CM

## 2021-11-20 ENCOUNTER — Telehealth: Payer: Self-pay | Admitting: Family Medicine

## 2021-11-20 NOTE — Telephone Encounter (Signed)
Pt saw  Dr Scot Dock at [facility] on 11-18-[facility]21 and has cyst behind her knee and she would like a callback

## 2021-11-23 NOTE — Telephone Encounter (Signed)
Left a detailed message with the information below at the patient's cell number. ?

## 2021-11-23 NOTE — Telephone Encounter (Signed)
Spoke with the patient for more information, she stated she just wanted to let Dr Legrand Como know that she was seen by Dr Scot Dock, diagnosed with Baker's cyst and he advised she see an orthopedic provider.  Patient declined referral and wanted to know what Dr Legrand Como recommended. Message sent to PCP.

## 2021-11-23 NOTE — Telephone Encounter (Signed)
If they are recommending an orthopedic referral then I agree with their recommendation.

## 2021-11-25 ENCOUNTER — Encounter: Payer: Self-pay | Admitting: Sports Medicine

## 2021-11-25 ENCOUNTER — Ambulatory Visit (INDEPENDENT_AMBULATORY_CARE_PROVIDER_SITE_OTHER): Payer: Medicare Other | Admitting: Sports Medicine

## 2021-11-25 ENCOUNTER — Ambulatory Visit (INDEPENDENT_AMBULATORY_CARE_PROVIDER_SITE_OTHER): Payer: Medicare Other

## 2021-11-25 VITALS — Ht 67.0 in | Wt 200.0 lb

## 2021-11-25 DIAGNOSIS — G5731 Lesion of lateral popliteal nerve, right lower limb: Secondary | ICD-10-CM | POA: Diagnosis not present

## 2021-11-25 DIAGNOSIS — M25561 Pain in right knee: Secondary | ICD-10-CM

## 2021-11-25 DIAGNOSIS — M7121 Synovial cyst of popliteal space [Baker], right knee: Secondary | ICD-10-CM

## 2021-11-25 MED ORDER — PREDNISONE 20 MG PO TABS: 40.0000 mg | ORAL_TABLET | Freq: Every day | ORAL | 0 refills | Status: AC

## 2021-11-25 NOTE — Progress Notes (Signed)
Pain in right leg for 1 month Pain starts at knee and goes down to the foot Denies OTC medication Went to a vascular specialist and they did a doppler; found a cyst

## 2021-11-25 NOTE — Progress Notes (Signed)
Jillian Guerrero - 76 y.o. female MRN [id number]  Date of birth: [birth year]  Office Visit Note: Visit Date: 11/25/2021 PCP: Farrel Conners, MD Referred by: Angelia Mould,*  Subjective: Chief Complaint  Patient presents with   Right Leg - Pain   HPI: Jillian Guerrero is a pleasant 76 y.o. female who presents today for subacute right posterior lateral knee pain.  She was previously seen by a vascular surgeon as well who ordered a Doppler ultrasound and did not show any DVT but did have a likely Baker's cyst behind the knee.  Per [place], for the last 1 month she has had pain over the posterior lateral right knee.  Pain feels like a burning sensation sometimes a tingling sensation that will go down the anterolateral calf into the foot.  She denies any weakness or foot drop at this time.  Her pain will be worse when she bends the knee or goes from a sitting to standing position.  The pain will feel like an electric shock at times and will take her off her feet, denies any recent falls.  She states she has had a nerve conduction studies done many years ago which showed some nerve involvement, but had this repeated years later and it had resolved.  She is on gabapentin 800 mg twice daily and has continue this for many years.  She denies any injury to the knee.  Pertinent ROS were reviewed with the patient and found to be negative unless otherwise specified above in HPI.   Assessment & Plan: Visit Diagnoses:  1. Acute pain of right knee   2. Baker's cyst of knee, right   3. Neuralgia of right peroneal nerve    Plan: I discussed with the patient today that she does have a Baker's cyst, however there is some generalized erythema and hyperemia noted on ultrasound around the posterior and posterior lateral aspect of the knees.  Her symptoms are more indicative of peroneal neuritis as she has burning and tingling down the lateral leg that extends into the foot.  I do not appreciate any foot  drop or muscular abnormality.  I would be surprised that the Baker's cyst as it is relatively small in size is causing her this much pain.  Given this I think it is best we move forward with MRI of the knee to better quantify the degree of her pain and evaluate for any nerve impingement/irritation.  I will see her back after the MRI to discuss next steps.  We will do a trial of prednisone 40 mg x 6 days in attempt to calm this pain and inflammation down.  We could consider aspiration and injection of the Baker's cyst versus Hydro dissection of the peroneal nerve.  Will await MRI to discuss neck steps.  Follow-up: Return for Follow-up after MRI scan.   Meds & Orders:  Meds ordered this encounter  Medications   predniSONE (DELTASONE) 20 MG tablet    Sig: Take 2 tablets (40 mg total) by mouth daily with breakfast for 6 days.    Dispense:  12 tablet    Refill:  0    Orders Placed This Encounter  Procedures   [name] Extrem [place]   MR Knee Right w/o contrast     Procedures: No procedures performed      Clinical History: No specialty comments available.  She reports that she has quit smoking. Her smoking use included cigarettes. She has a 40.00 pack-year smoking history.  She has never used smokeless tobacco. No results for input(s): "HGBA1C", "LABURIC" in the last 8760 hours.  Objective:   Vital Signs: Ht 5\' 7"  (1.702 m)   Wt 200 lb (90.7 kg)   BMI 31.32 kg/m   Physical Exam  Gen: Well-appearing, in no acute distress; non-toxic CV: Regular Rate. Well-perfused. Warm.  Resp: Breathing unlabored on room air; no wheezing. Psych: Fluid speech in conversation; appropriate affect; normal thought process Neuro: Sensation intact throughout. No gross coordination deficits.   Ortho Exam - Right knee: Evaluation of the knee demonstrates no effusion or bony deformity.  There is a palpable fullness within the popliteal fossa with some surrounding warmth.  There is positive Tinel's over the  peroneal nerve behind the lateral fibular head.  No notable foot drop.  Full range of motion from 0-135 degrees of the knee although there is some pain with endrange flexion of the knee.  Ligamentously intact.  Calves are soft and nontender bilaterally.  Imaging: Extrem Low Right Ltd  Result Date: 11/25/2021 [facility] Limited knee ultrasound performed, right Evaluation of the posterior knee does demonstrate a hypoechoic structure around the medial head of the gastrocnemius tendon which is 2.1 x 1.1 on short-axis and 2.5 x 1.5 cm in nature in long-axis, likely reflecting Baker's cyst.  There is hyperemia however noted around this area.  There is some generalized hyperemia noted around the lateral femoral condyle.  No cortical regularity.  Evaluation of the peroneal nerve does show a slightly thickened nerve just posterior to the bone.   *Independent report review of NCV with EMG on 07/02/2020 demonstrates a normal study of the upper and lower extremities.  There is no abnormal sensory response for the peroneal nerves.  Past Medical/Family/Surgical/Social History: Medications & Allergies reviewed per EMR, new medications updated. Patient Active Problem List   Diagnosis Date Noted   Herpes zoster 11/10/2021   Peripheral neuropathy 11/10/2021   Chronic rhinitis 01/02/2021   Chronic cough 11/27/2020   Hyperlipidemia 01/20/2020   Asthma 01/20/2020   GERD (gastroesophageal reflux disease) 01/20/2020   Osteoporosis 01/20/2020   Vitamin D deficiency 01/20/2020   Macular degeneration of both eyes 01/20/2020   Past Medical History:  Diagnosis Date   GERD (gastroesophageal reflux disease)    Hyperlipidemia    Lumbar compression fracture (HCC)    Osteoporosis    Peripheral neuropathy    Family History  Problem Relation Age of Onset   Arthritis Mother    Heart disease Mother    Arthritis Father    Depression Father    Arthritis Brother    High blood pressure Brother    High Cholesterol Brother     Arthritis Maternal Grandfather    Asthma Paternal Grandmother    Depression Paternal Grandmother    Heart disease Paternal Grandmother    High Cholesterol Paternal Grandmother    Arthritis Son    Heart attack Son    High Cholesterol Son    Arthritis Daughter    Past Surgical History:  Procedure Laterality Date   APPENDECTOMY  1956   BLADDER SUSPENSION  2003   BREAST BIOPSY  2015   hyaluronic acid viscosupplementation Bilateral    knees - 01/16/2018 x2   IR RADIOLOGIST EVAL & MGMT  01/21/2021   KYPHOPLASTY  10/2018   TONSILLECTOMY  1964   UPPER GI ENDOSCOPY  03/08/2019   gastritis   Social History   Occupational History   Not on file  Tobacco Use   Smoking status: Former  Packs/day: 1.00    Years: 40.00    Total pack years: 40.00    Types: Cigarettes   Smokeless tobacco: Never  Vaping Use   Vaping Use: Never used  Substance and Sexual Activity   Alcohol use: Never   Drug use: Never   Sexual activity: Not on file

## 2021-12-06 ENCOUNTER — Ambulatory Visit
Admission: RE | Admit: 2021-12-06 | Discharge: 2021-12-06 | Disposition: A | Discharge status: Home / Self Care | Payer: Medicare Other | Source: Ambulatory Visit | Attending: Sports Medicine | Admitting: Sports Medicine

## 2021-12-06 DIAGNOSIS — M25561 Pain in right knee: Secondary | ICD-10-CM

## 2021-12-16 ENCOUNTER — Encounter: Payer: Self-pay | Admitting: Sports Medicine

## 2021-12-16 ENCOUNTER — Ambulatory Visit (INDEPENDENT_AMBULATORY_CARE_PROVIDER_SITE_OTHER): Payer: Medicare Other | Admitting: Orthopedic Surgery

## 2021-12-16 ENCOUNTER — Ambulatory Visit (INDEPENDENT_AMBULATORY_CARE_PROVIDER_SITE_OTHER): Payer: Medicare Other

## 2021-12-16 ENCOUNTER — Ambulatory Visit (INDEPENDENT_AMBULATORY_CARE_PROVIDER_SITE_OTHER): Payer: Medicare Other | Admitting: Sports Medicine

## 2021-12-16 DIAGNOSIS — M25561 Pain in right knee: Secondary | ICD-10-CM

## 2021-12-16 DIAGNOSIS — S83271A Complex tear of lateral meniscus, current injury, right knee, initial encounter: Secondary | ICD-10-CM

## 2021-12-16 DIAGNOSIS — M7121 Synovial cyst of popliteal space [Baker], right knee: Secondary | ICD-10-CM

## 2021-12-16 MED ORDER — NAPROXEN 500 MG PO TABS: ORAL_TABLET | ORAL | 0 refills | Status: DC

## 2021-12-16 NOTE — Progress Notes (Signed)
No change since last visit; still painful. Has not had any PT. Had MRI on 12/06/21.

## 2021-12-16 NOTE — Progress Notes (Signed)
Jillian Guerrero - 76 y.o. female MRN [id number]  Date of birth: 10/04/1945  Office Visit Note: Visit Date: 12/16/2021 PCP: Farrel Conners, MD Referred by: Farrel Conners, MD  Subjective: Chief Complaint  Patient presents with   Right Knee - Follow-up, Pain   HPI: Jillian Guerrero is a pleasant 76 y.o. female who presents today for follow-up of right knee pain.  She continues with right lateral knee pain. Has some intermittent swelling at times.  Pain is over the posterior lateral aspect of the right knee.  Will radiate down the proximal lateral calf at times.  She has issues with bending the knee when she goes from a sitting to standing position.  She has had a few episodes of the knee walking on her when bent over to get something off the floor which causes excruciating pain.  Oral medications are not helping.  Had an MRI of the knee since that time which showed complex tearing of the lateral meniscal body and anterior horn.  Pertinent ROS were reviewed with the patient and found to be negative unless otherwise specified above in HPI.   Assessment & Plan: Visit Diagnoses:  1. Complex tear of lateral meniscus of right knee as current injury, initial encounter   2. Acute pain of right knee   3. Baker's cyst of knee, right    Plan: Discussed and reviewed the knee MRI with Doretta today.  She does have complex tearing of the lateral meniscal body and anterior horn, which I do think is the main driver of her pain.  She does have a Baker's cyst as well and some chronic medial meniscal fraying but I do not think this is causing her pain.  I did discuss the role for corticosteroid injection therapy and formalized physical therapy, however she is not interested in this at this time.  Given the degree of her pain and her locking episodes, she wishes to proceed with knee arthroscopy with meniscectomy and whatever my surgical colleague, Dr. Marlou Sa, would need to perform at that time. I did discuss  this case with him today and did send the patient downstairs to discuss surgical treatment and get her scheduled for knee arthroscopy.  She will follow-up with Dr. Marlou Sa for routine post arthroscopic care.  She may see me at any point in the future if she desires.  Follow-up: Return for F/u with Dr. Marlou Sa this morning for knee arthroscopy.   Meds & Orders: No orders of the defined types were placed in this encounter.   Orders Placed This Encounter  Procedures   XR Knee 1-2 Views Right     Procedures: No procedures performed      Clinical History: No specialty comments available.  She reports that she has quit smoking. Her smoking use included cigarettes. She has a 40.00 pack-year smoking history. She has never used smokeless tobacco. No results for input(s): "HGBA1C", "LABURIC" in the last 8760 hours.  Objective:    Physical Exam  Gen: Well-appearing, in no acute distress; non-toxic CV: Regular Rate. Well-perfused. Warm.  Resp: Breathing unlabored on room air; no wheezing. Psych: Fluid speech in conversation; appropriate affect; normal thought process Neuro: Sensation intact throughout. No gross coordination deficits.   Ortho Exam - Right knee: There is a small joint effusion present.  Positive TTP over the lateral joint line.  Range of motion from 0-130 degrees.  There is pain with McMurray's testing and Thessaly's testing on the right knee.  There is pain  with endrange knee flexion.  Ligamentously intact.  Calves are soft and nontender bilaterally.  Imaging: No results found.  MR Knee Right w/o contrast CLINICAL DATA:  Chronic right knee pain  EXAM: MRI OF THE RIGHT KNEE WITHOUT CONTRAST  TECHNIQUE: Multiplanar, multisequence MR imaging of the knee was performed. No intravenous contrast was administered.  COMPARISON:  None Available.  FINDINGS: MENISCI  Medial meniscus: Intrasubstance degeneration with degenerative-appearing fraying of the undersurface of the  medial meniscus centered at the posterior horn-body junction (series 10, images 5-7).  Lateral meniscus: Complex tearing of the lateral meniscal body and anterior horn.  LIGAMENTS  Cruciates: Intact ACL and PCL.  Collaterals: Intact MCL. Lateral collateral ligament complex intact.  CARTILAGE  Patellofemoral: Mild-moderate cartilage thinning of the lateral patellar facet with focal full-thickness fissuring near the patellar apex. No trochlear cartilage defect.  Medial:  No chondral defect.  Lateral:  No chondral defect.  MISCELLANEOUS  Joint: Small joint effusion. There may be mild synovitis within the intercondylar notch. Fat pads within normal limits.  Popliteal Fossa: Moderate-sized Baker's cyst. Intact popliteus tendon.  Extensor Mechanism:  Intact quadriceps and patellar tendons.  Bones: No acute fracture. No dislocation. Mild reactive subchondral marrow signal changes within the patella. No marrow replacing bone lesion.  Other: No significant periarticular soft tissue findings.  IMPRESSION: 1. Complex tearing of the lateral meniscal body and anterior horn. 2. Intrasubstance degeneration with degenerative-appearing fraying of the undersurface of the medial meniscus. 3. Mild-moderate cartilage thinning of the lateral patellar facet with focal full-thickness fissuring near the patellar apex. 4. Small joint effusion with mild synovitis within the intercondylar notch. 5. Moderate-sized Baker's cyst.  Electronically Signed   By: Duanne Guess D.O.   On: 12/08/2021 13:34    Past Medical/Family/Surgical/Social History: Medications & Allergies reviewed per EMR, new medications updated. Patient Active Problem List   Diagnosis Date Noted   Herpes zoster 11/10/2021   Peripheral neuropathy 11/10/2021   Chronic rhinitis 01/02/2021   Chronic cough 11/27/2020   Hyperlipidemia 01/20/2020   Asthma 01/20/2020   GERD (gastroesophageal reflux disease) 01/20/2020    Osteoporosis 01/20/2020   Vitamin D deficiency 01/20/2020   Macular degeneration of both eyes 01/20/2020   Past Medical History:  Diagnosis Date   GERD (gastroesophageal reflux disease)    Hyperlipidemia    Lumbar compression fracture (HCC)    Osteoporosis    Peripheral neuropathy    Family History  Problem Relation Age of Onset   Arthritis Mother    Heart disease Mother    Arthritis Father    Depression Father    Arthritis Brother    High blood pressure Brother    High Cholesterol Brother    Arthritis Maternal Grandfather    Asthma Paternal Grandmother    Depression Paternal Grandmother    Heart disease Paternal Grandmother    High Cholesterol Paternal Grandmother    Arthritis Son    Heart attack Son    High Cholesterol Son    Arthritis Daughter    Past Surgical History:  Procedure Laterality Date   APPENDECTOMY  1956   BLADDER SUSPENSION  2003   BREAST BIOPSY  2015   hyaluronic acid viscosupplementation Bilateral    knees - 01/16/2018 x2   IR RADIOLOGIST EVAL & MGMT  01/21/2021   KYPHOPLASTY  10/2018   TONSILLECTOMY  1964   UPPER GI ENDOSCOPY  03/08/2019   gastritis   Social History   Occupational History   Not on file  Tobacco Use  Smoking status: Former    Packs/day: 1.00    Years: 40.00    Total pack years: 40.00    Types: Cigarettes   Smokeless tobacco: Never  Vaping Use   Vaping Use: Never used  Substance and Sexual Activity   Alcohol use: Never   Drug use: Never   Sexual activity: Not on file   I spent 40 minutes in the care of the patient today including face-to-face time, preparation to see the patient, as well as review of MRI; counseling and educating the patient on injection therapy and rehab versus possible surgical knee arthroscopy; discussion with my surgical colleague, Dr. Marlou Sa, to schedule knee arthroscopy with meniscectomy for the above diagnoses.   Elba Barman, DO Primary Care Sports Medicine Physician  [place]  This note was dictated using Dragon naturally speaking software and may contain errors in syntax, spelling, or content which have not been identified prior to signing this note.

## 2021-12-17 ENCOUNTER — Encounter: Payer: Self-pay | Admitting: Orthopedic Surgery

## 2021-12-17 NOTE — Progress Notes (Signed)
Office Visit Note   Patient: Jillian Guerrero           Date of Birth: Apr 23, 1945           MRN: [id number] Visit Date: 12/16/2021 Requested by: Farrel Conners, [address] [place],  [place] [address] PCP: Farrel Conners, MD  Subjective: Chief Complaint  Patient presents with   Right Knee - Pain    HPI: Jillian Guerrero is a 76 y.o. female who presents to the office reporting right knee pain.  Denies any history of injury.  The pain began insidiously 1 morning several weeks ago.  Reports only pain with no mechanical symptoms.  Only affects the lateral side.  She does report a little numbness and tingling in the right great toe which is likely unrelated to this current problem.  She also reports back pain.  She states she is limping.  She has a history of neuropathy which has improved and she does take gabapentin for that.  She also has a history of kyphoplasty.  She is leaving on a trip that she wants to be very functional for in approximately 1 month.  She has had an MRI scan which does show generally intact articular surfaces in the medial and lateral compartments with horizontal cleavage type tearing about lateral meniscus extending anteriorly.  Effusion is present..                ROS: All systems reviewed are negative as they relate to the chief complaint within the history of present illness.  Patient denies fevers or chills.  Assessment & Plan: Visit Diagnoses:  1. Complex tear of lateral meniscus of right knee as current injury, initial encounter     Plan: Impression is likely symptomatic effusion and right knee degenerative meniscal tear in a 76 year old patient who wants to be fully functional in 1 month.  For patient in her age group I think arthroscopy and debridement is not out of the question but getting her to full functionality within a month to be a stretch.  We discussed this at length today.  She wants to try an injection first and we will also try her on  some Naprosyn just for 10 days.  Plan to see her back in mid December after she comes back from her trip and we will likely discuss her response to the injection and possibly schedule arthroscopic debridement at that time.  Follow-Up Instructions: No follow-ups on file.   Orders:  No orders of the defined types were placed in this encounter.  Meds ordered this encounter  Medications   naproxen (NAPROSYN) 500 MG tablet    Sig: Bid x 10 days then q d    Dispense:  30 tablet    Refill:  0      Procedures: Large Joint Inj: R knee on 12/16/2021 10:39 AM Indications: diagnostic evaluation, joint swelling and pain Details: 18 G 1.5 in needle, superolateral approach  Arthrogram: No  Medications: 5 mL lidocaine 1 %; 40 mg methylPREDNISolone acetate 40 MG/ML; 4 mL bupivacaine 0.25 % Outcome: tolerated well, no immediate complications Procedure, treatment alternatives, risks and benefits explained, specific risks discussed. Consent was given by the patient. Immediately prior to procedure a time out was called to verify the correct patient, procedure, equipment, support staff and site/side marked as required. Patient was prepped and draped in the usual sterile fashion.       Clinical Data: No additional findings.  Objective: Vital Signs:  There were no vitals taken for this visit.  Physical Exam:  Constitutional: Patient appears well-developed HEENT:  Head: Normocephalic Eyes:EOM are normal Neck: Normal range of motion Cardiovascular: Normal rate Pulmonary/chest: Effort normal Neurologic: Patient is alert Skin: Skin is warm Psychiatric: Patient has normal mood and affect  Ortho Exam: Ortho exam demonstrates no nerve root tension signs.  Pedal pulses palpable bilaterally.  Range of motion excellent on the right and left from 0 to about 125.  Collateral cruciate ligaments are stable on the right-hand side.  Moderate effusion is present.  Extensor mechanism intact.  Mild  patellofemoral crepitus is present bilaterally.  No tenderness of the iliotibial band over the lateral epicondyle.  Patient does have some lateral joint line tenderness.  No medial joint line tenderness.  McMurray compression testing equivocal on the right negative on the left.  Specialty Comments:  No specialty comments available.  Imaging: No results found.   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   Past Medical History:  Diagnosis Date   GERD (gastroesophageal reflux disease)    Hyperlipidemia    Lumbar compression fracture (HCC)    Osteoporosis    Peripheral neuropathy     Family History  Problem Relation Age of Onset   Arthritis Mother    Heart disease Mother    Arthritis Father    Depression Father    Arthritis Brother    High blood pressure Brother    High Cholesterol Brother    Arthritis Maternal Grandfather    Asthma Paternal Grandmother    Depression Paternal Grandmother    Heart disease Paternal Grandmother    High Cholesterol Paternal Grandmother    Arthritis Son    Heart attack Son    High Cholesterol Son    Arthritis Daughter     Past Surgical History:  Procedure Laterality Date   APPENDECTOMY  1956   BLADDER SUSPENSION  2003   BREAST BIOPSY  2015   hyaluronic acid viscosupplementation Bilateral    knees - 01/16/2018 x2   IR RADIOLOGIST EVAL & MGMT  01/21/2021   KYPHOPLASTY  10/2018   TONSILLECTOMY  1964   UPPER GI ENDOSCOPY  03/08/2019   gastritis   Social History   Occupational History   Not on file  Tobacco Use   Smoking status: Former    Packs/day: 1.00    Years: 40.00    Total pack  years: 40.00    Types: Cigarettes   Smokeless tobacco: Never  Vaping Use   Vaping Use: Never used  Substance and Sexual Activity   Alcohol use: Never   Drug use: Never   Sexual activity: Not on file

## 2021-12-20 MED ORDER — BUPIVACAINE HCL 0.25 % IJ SOLN
4.0000 mL | INTRAMUSCULAR | Status: AC | PRN
  Administered 2021-12-16: 4 mL via INTRA_ARTICULAR

## 2021-12-20 MED ORDER — LIDOCAINE HCL 1 % IJ SOLN
5.0000 mL | INTRAMUSCULAR | Status: AC | PRN
  Administered 2021-12-16: 5 mL

## 2021-12-20 MED ORDER — METHYLPREDNISOLONE ACETATE 40 MG/ML IJ SUSP
40.0000 mg | INTRAMUSCULAR | Status: AC | PRN
  Administered 2021-12-16: 40 mg via INTRA_ARTICULAR

## 2022-01-18 ENCOUNTER — Telehealth: Payer: Self-pay | Admitting: Family Medicine

## 2022-01-18 NOTE — Telephone Encounter (Signed)
Pt has a UTI and is asking if MD could please send a prescription for Ciprol to:  [id number] - [place], [place] - [address] Phone: [phone]  Fax: [phone]     Pt is leaving for [place] in 2 days and really needs to start treatment ASAP.  LOV:  11/10/21

## 2022-01-18 NOTE — Telephone Encounter (Signed)
Spoke with the patient and informed her an appt is needed for evaluation.  I offered an appt tomorrow with Dr Casimiro Needle, this was scheduled for tomorrow with Dr Casimiro Needle and the patient declined as she stated she will go to an urgent care.

## 2022-01-19 ENCOUNTER — Ambulatory Visit: Payer: Medicare Other | Admitting: Family Medicine

## 2022-02-11 ENCOUNTER — Encounter: Payer: Self-pay | Admitting: Family Medicine

## 2022-02-11 ENCOUNTER — Telehealth: Payer: Self-pay | Admitting: Family Medicine

## 2022-02-11 ENCOUNTER — Telehealth (INDEPENDENT_AMBULATORY_CARE_PROVIDER_SITE_OTHER): Payer: Medicare Other | Admitting: Family Medicine

## 2022-02-11 DIAGNOSIS — U071 COVID-19: Secondary | ICD-10-CM | POA: Diagnosis not present

## 2022-02-11 DIAGNOSIS — R051 Acute cough: Secondary | ICD-10-CM

## 2022-02-11 MED ORDER — MOLNUPIRAVIR EUA 200MG CAPSULE: 4.0000 | ORAL_CAPSULE | Freq: Two times a day (BID) | ORAL | 0 refills | Status: DC

## 2022-02-11 MED ORDER — BENZONATATE 100 MG PO CAPS: 100.0000 mg | ORAL_CAPSULE | Freq: Two times a day (BID) | ORAL | 0 refills | Status: DC | PRN

## 2022-02-11 NOTE — Telephone Encounter (Signed)
Patient called stating she received a call for her 345 appointment then a video appointment was attempted but failed. Said someone called her recently and told her she would get a call shortly and no one has called her yet. Please call patient with an update

## 2022-02-11 NOTE — Telephone Encounter (Signed)
error 

## 2022-02-11 NOTE — Progress Notes (Signed)
Virtual Visit via Telephone Note  I connected with Jillian Guerrero on 02/11/22 at  3:45 PM EST by telephone and verified that I am speaking with the correct person using two identifiers.   I discussed the limitations, risks, security and privacy concerns of performing an evaluation and management service by telephone and the availability of in person appointments. I also discussed with the patient that there may be a patient responsible charge related to this service. The patient expressed understanding and agreed to proceed.  Location patient: home Location provider: work or home office Participants present for the call: patient, provider Patient did not have a visit in the prior 7 days to address this/these issue(s).  Chief Complaint  Patient presents with   Covid Positive    Pt reports she had went to [place] and came back on Sunday. Sx of bodyaches, sore throat, cough, fatigue on Monday. Chest feels heavy. Patient continues she is prone to bronchitis and pneumonia. Pt reports she tested positive for covid on Tuesday. Taking theraflu.     History of Present Illness: Pt is a 76 year old female with pmh sig for, GERD, peripheral neuropathy, osteoporosis, shingles, HLD, macular degeneration bilaterally, vitamin D deficiency who is followed by Dr. Casimiro Needle and seen for acute concern.    Pt was in [place] for 11 days, just returned on Sunday.  Monday 02/08/22 felt fatigued.  Developed coughing, sore throat, chest heaviness, burning in chest, upper back, loose stools, HA.  At home COVID test positive 02/09/22.  Taking theraflu.  Drinking lots of liquids including tea, made soup.  Has albuterol inhaler.  Pt concerned about getting pna or bronchitis.  Pt had 4 COVID vaccines.     Observations/Objective: Patient sounds cheerful and well on the phone, mildly fatigued. I do not appreciate any SOB.  Intermittent cough. Speech and thought processing are grossly intact. Patient reported  vitals:  Assessment and Plan: COVID-19 virus infection - Plan: molnupiravir EUA (LAGEVRIO) 200 mg CAPS capsule, benzonatate (TESSALON) 100 MG capsule  Acute cough  Symptoms starting Monday 02/08/22 with positive at home COVID test 02/09/22.  Discussed r/b/a of antiviral medications.  Patient wishes to start medication.  Rx for molnupiravir sent to pharmacy.  Continue OTC supportive care including cough/cold medication, Tylenol, rest, hydration, etc.  Rx for Tessalon also sent to pharmacy.  Given strict precautions.  Discussed quarantine and likely duration of symptoms.  Follow Up Instructions: F/u prn for continued or worsened symptoms  [id number] [id number] [id number] 11-20 [id number] 21-30 I did not refer this patient for an OV in the next 24 hours for this/these issue(s).  I discussed the assessment and treatment plan with the patient. The patient was provided an opportunity to ask questions and all were answered. The patient agreed with the plan and demonstrated an understanding of the instructions.   The patient was advised to call back or seek an in-person evaluation if the symptoms worsen or if the condition fails to improve as anticipated.  I provided 18 minutes of non-face-to-face time during this encounter.   Jillian Saint, MD

## 2022-02-12 ENCOUNTER — Telehealth: Payer: Self-pay | Admitting: Family Medicine

## 2022-02-12 DIAGNOSIS — U071 COVID-19: Secondary | ICD-10-CM

## 2022-02-12 MED ORDER — NIRMATRELVIR/RITONAVIR (PAXLOVID)TABLET: 3.0000 | ORAL_TABLET | Freq: Two times a day (BID) | ORAL | 0 refills | Status: AC

## 2022-02-12 NOTE — Telephone Encounter (Signed)
Pharmacy states they do not have nirmatrelvir/ritonavir EUA (PAXLOVID) 20 x 150 MG & 10 x 100MG  TABS and to please call in to another pharmacy and make patient aware of where it was sent

## 2022-02-12 NOTE — Telephone Encounter (Signed)
Pt had a VV yesterday with Dr. Salomon Fick. Pt called to inform MD that her pharmacy and all the other surrounding pharmacies do not have  molnupiravir EUA (LAGEVRIO) 200 mg CAPS capsule  Pt says she is feeling a little better, but would like to start taking an alternative like Paxlovid, if MD wants her to take something.  Please advise.  Pt is asking for a call back to let her know, either way.  [id number] - [place], [place] - [address] Phone: [phone]  Fax: [phone]

## 2022-02-12 NOTE — Telephone Encounter (Signed)
I sent the paxlovid to the [facility], she will need to HOLD her atorvastatin while taking the paxlovid.

## 2022-02-12 NOTE — Telephone Encounter (Signed)
Spoke to pt and she advised that pharmacist stated that she is outside of the window for taking. Pt stated that the cough medication that was sent in is helping her a lot. No further actions needed.

## 2022-02-12 NOTE — Telephone Encounter (Signed)
Please advise 

## 2022-02-15 ENCOUNTER — Ambulatory Visit (INDEPENDENT_AMBULATORY_CARE_PROVIDER_SITE_OTHER): Payer: Medicare Other | Admitting: Orthopedic Surgery

## 2022-02-15 DIAGNOSIS — M545 Low back pain, unspecified: Secondary | ICD-10-CM | POA: Diagnosis not present

## 2022-02-17 ENCOUNTER — Encounter: Payer: Self-pay | Admitting: Orthopedic Surgery

## 2022-02-17 NOTE — Progress Notes (Signed)
Office Visit Note   Patient: Jillian Guerrero           Date of Birth: June 06, 1945           MRN: [id number] Visit Date: 02/15/2022 Requested by: Karie Georges, MD [address] [place],  [place] [address] PCP: Karie Georges, MD  Subjective: Chief Complaint  Patient presents with   Right Leg - Pain    HPI: Jillian Guerrero is a 76 y.o. female who presents to the office reporting right knee pain as well as low back pain.  Here for 57-month follow-up on right knee degenerative meniscal tear.  Old notes reviewed.  She has a brace which she uses.  Injection 1018 did help with the pain but she still feels a lot of weakness in the right leg.  She also reports low back pain.  Describes radicular pain and pain which wakes her from sleep at night along with numbness and tingling in the leg.  Old notes and records are reviewed.  She did have kyphoplasty done last year at L1 for compression fracture.  MRI scan done at that time showed L1 compression deformity T with no cord compression.                ROS: All systems reviewed are negative as they relate to the chief complaint within the history of present illness.  Patient denies fevers or chills.  Assessment & Plan: Visit Diagnoses:  1. 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  Particularly considering her history of L1 kyphoplasty and MRI scan which shows L1 compression deformity.  She does report some numbness and tingling in her leg.  She has a brace for her knee.  I think the big problem she has now is in her back.  Needs MRI scan L-spine to evaluate L1 compression fracture and possible scoliosis with history of decreased walking endurance.  Follow-up after that study. Follow-Up Instructions: No  follow-ups on file.   Orders:  Orders Placed This Encounter  Procedures   MR Lumbar Spine w/o contrast   No orders of the defined types were placed in this encounter.     Procedures: No procedures performed   Clinical Data: No additional findings.  Objective: Vital Signs: There were no vitals taken for this visit.  Physical Exam:  Constitutional: Patient appears well-developed HEENT:  Head: Normocephalic Eyes:EOM are normal Neck: Normal range of motion Cardiovascular: Normal rate Pulmonary/chest: Effort normal Neurologic: Patient is alert Skin: Skin is warm Psychiatric: Patient has normal mood and affect  Ortho Exam: Ortho exam demonstrates no knee effusion on the right.  Excellent range of motion.  No quad atrophy bilaterally.  Normal gait.  No groin pain with internal/external Tatian on the leg.  Pedal pulses palpable.  No nerve root tension signs.  Symmetric reflexes 0 to 1+ out of 4 bilateral patella and Achilles.  5 out of 5 ankle dorsiflexion plantarflexion quad and hamstring strength.  Specialty Comments:  No specialty comments available.  Imaging: No results found.   PMFS History: Patient Active Problem List   Diagnosis Date Noted   Herpes zoster 11/10/2021   Peripheral neuropathy 11/10/2021   Chronic rhinitis 01/02/2021   Chronic cough 11/27/2020   Hyperlipidemia 01/20/2020   Asthma 01/20/2020  GERD (gastroesophageal reflux disease) 01/20/2020   Osteoporosis 01/20/2020   Vitamin D deficiency 01/20/2020   Macular degeneration of both eyes 01/20/2020   Past Medical History:  Diagnosis Date   GERD (gastroesophageal reflux disease)    Hyperlipidemia    Lumbar compression fracture (HCC)    Osteoporosis    Peripheral neuropathy     Family History  Problem Relation Age of Onset   Arthritis Mother    Heart disease Mother    Arthritis Father    Depression Father    Arthritis Brother    High blood pressure Brother    High Cholesterol Brother     Arthritis Maternal Grandfather    Asthma Paternal Grandmother    Depression Paternal Grandmother    Heart disease Paternal Grandmother    High Cholesterol Paternal Grandmother    Arthritis Son    Heart attack Son    High Cholesterol Son    Arthritis Daughter     Past Surgical History:  Procedure Laterality Date   APPENDECTOMY  1956   BLADDER SUSPENSION  2003   BREAST BIOPSY  2015   hyaluronic acid viscosupplementation Bilateral    knees - 01/16/2018 x2   IR RADIOLOGIST EVAL & MGMT  01/21/2021   KYPHOPLASTY  10/2018   TONSILLECTOMY  1964   UPPER GI ENDOSCOPY  03/08/2019   gastritis   Social History   Occupational History   Not on file  Tobacco Use   Smoking status: Former    Packs/day: 1.00    Years: 40.00    Total pack years: 40.00    Types: Cigarettes   Smokeless tobacco: Never  Vaping Use   Vaping Use: Never used  Substance and Sexual Activity   Alcohol use: Never   Drug use: Never   Sexual activity: Not on file

## 2022-02-23 ENCOUNTER — Telehealth: Payer: Self-pay | Admitting: Emergency Medicine

## 2022-02-23 ENCOUNTER — Encounter: Payer: Self-pay | Admitting: Adult Health

## 2022-02-23 ENCOUNTER — Ambulatory Visit (INDEPENDENT_AMBULATORY_CARE_PROVIDER_SITE_OTHER)
Admission: RE | Admit: 2022-02-23 | Discharge: 2022-02-23 | Disposition: A | Discharge status: Home / Self Care | Payer: Medicare Other | Source: Ambulatory Visit | Attending: Adult Health | Admitting: Adult Health

## 2022-02-23 ENCOUNTER — Ambulatory Visit (INDEPENDENT_AMBULATORY_CARE_PROVIDER_SITE_OTHER): Payer: Medicare Other | Admitting: Adult Health

## 2022-02-23 VITALS — BP 120/80 | HR 90 | Temp 97.7°F | Ht 67.0 in | Wt 204.6 lb

## 2022-02-23 DIAGNOSIS — J42 Unspecified chronic bronchitis: Secondary | ICD-10-CM

## 2022-02-23 DIAGNOSIS — J441 Chronic obstructive pulmonary disease with (acute) exacerbation: Secondary | ICD-10-CM | POA: Diagnosis not present

## 2022-02-23 DIAGNOSIS — J4531 Mild persistent asthma with (acute) exacerbation: Secondary | ICD-10-CM | POA: Diagnosis not present

## 2022-02-23 MED ORDER — ALBUTEROL SULFATE (2.5 MG/3ML) 0.083% IN NEBU: 2.5000 mg | INHALATION_SOLUTION | Freq: Four times a day (QID) | RESPIRATORY_TRACT | 5 refills | Status: DC | PRN

## 2022-02-23 MED ORDER — AZITHROMYCIN 250 MG PO TABS: ORAL_TABLET | ORAL | 0 refills | Status: AC

## 2022-02-23 MED ORDER — FLUTICASONE FUROATE-VILANTEROL 100-25 MCG/ACT IN AEPB: 1.0000 | INHALATION_SPRAY | Freq: Every day | RESPIRATORY_TRACT | 5 refills | Status: DC

## 2022-02-23 MED ORDER — BENZONATATE 200 MG PO CAPS: 200.0000 mg | ORAL_CAPSULE | Freq: Three times a day (TID) | ORAL | 1 refills | Status: DC | PRN

## 2022-02-23 MED ORDER — PREDNISONE 10 MG PO TABS: ORAL_TABLET | ORAL | 0 refills | Status: DC

## 2022-02-23 MED ORDER — ALBUTEROL SULFATE (2.5 MG/3ML) 0.083% IN NEBU: 2.5000 mg | INHALATION_SOLUTION | Freq: Once | RESPIRATORY_TRACT | Status: DC

## 2022-02-23 NOTE — Telephone Encounter (Signed)
Spoke with the Jillian Guerrero  She is scheduled for TP at 1:30 pm today  CXR at [facility] first- given directions and address

## 2022-02-23 NOTE — Assessment & Plan Note (Signed)
Acute asthmatic bronchitic exacerbation.  Recent COVID-19 infection. Chest x-ray today is clear.  Will treat with empiric antibiotics and steroids. Add Breo.  Albuterol nebulizer treatment given in the office.  Neb machine sent to the DME company.   Plan  Patient Instructions  Begin Zpack take as directed Prednisone taper over next week  Delsym 2 tsp Twice daily  As needed  cough Tessalon Three times a day  as needed cough  Saline nasal rinses As needed   Zyrtec daily  May try Chlorpheniramine 4mg  At bedtime  As needed  drainage  Continue on Protonix daily  GERD diet  Add Pepcid 20mg  At bedtime  for 2 weeks and then as needed for reflux  Begin BREO 1 puff daily, rinse after use.  Albuterol inhaler or neb As needed   Order for .  Follow up with Dr.  in 3-4 weeks and As needed   Please contact office for sooner follow up if symptoms do not improve or worsen or seek emergency care

## 2022-02-23 NOTE — Progress Notes (Signed)
@Patient  ID: Jillian Guerrero, female    DOB: Jul 21, 1945, 76 y.o.   MRN: [id number]  Chief Complaint  Patient presents with   Acute Visit    Referring provider: Farrel Conners, MD  HPI: 76 year old female former smoker followed for mild COPD with asthma.  Medical history significant for vocal cord polyps.  TEST/EVENTS :   02/23/2022 Acute OV: Cough  Patient presents for an acute office visit.  Complains over the last 3 weeks that she has had ongoing cough, congestion, wheezing and drainage.  Patient had traveled to [place].  Tested positive for COVID-19 3 weeks ago.  Patient has severe coughing paroxysms to the point where she can throw up. Has been using over the counter cold medications. PCP called in tessalon without any improvement .  Patient denies any fever, chest pain, orthopnea, edema.  No calf pain.  Chest x-ray today in the office is clear.  O2 saturations are 95% on room air. She is very tired and low energy. Body aches has resolved.  Prolonged talking makes her cough. Cough is wearing her out.  Patient denies any hemoptysis, chest pain, calf pain.     Allergies  Allergen Reactions   Collagen     Redness, swelling    Immunization History  Administered Date(s) Administered   Fluad Quad(high Dose 65+) 11/10/2021   Hepatitis A 01/02/2014   Hepatitis A, Adult 01/02/2014, 02/11/2014   Influenza-Unspecified 02/22/2020   Moderna Sars-Covid-2 Vaccination 06/12/2019, 07/11/2019, 04/08/2020, 01/19/2021   Pneumococcal Conjugate-13 12/18/2013   Pneumococcal Polysaccharide-23 09/12/2012   Td 11/08/2013   Tdap 01/02/2014   Typhoid Inactivated 01/02/2014   Typhoid Live 01/02/2014   Yellow Fever 01/02/2014   Zoster Recombinat (Shingrix) 12/12/2020, 04/01/2021    Past Medical History:  Diagnosis Date   GERD (gastroesophageal reflux disease)    Hyperlipidemia    Lumbar compression fracture (HCC)    Osteoporosis    Peripheral neuropathy     Tobacco History: Social  History   Tobacco Use  Smoking Status Former   Packs/day: 1.00   Years: 40.00   Total pack years: 40.00   Types: Cigarettes  Smokeless Tobacco Never   Counseling given: Not Answered   Outpatient Medications Prior to Visit  Medication Sig Dispense Refill   albuterol (VENTOLIN HFA) 108 (90 Base) MCG/ACT inhaler Inhale 2 puffs into the lungs every 6 (six) hours as needed for wheezing or shortness of breath. 8 g 1   alendronate (FOSAMAX) 70 MG tablet Take 1 tablet (70 mg total) by mouth every 7 (seven) days. Take with a full glass of water on an empty stomach. 12 tablet 3   atorvastatin (LIPITOR) 10 MG tablet Take 1 tablet (10 mg total) by mouth daily. 90 tablet 3   cetirizine (ZYRTEC) 10 MG tablet Take 10 mg by mouth daily.     gabapentin (NEURONTIN) 600 MG tablet Take 1 tablet (600 mg total) by mouth 2 (two) times daily. 180 tablet 1   naproxen (NAPROSYN) 500 MG tablet Bid x 10 days then q d 30 tablet 0   pantoprazole (PROTONIX) 40 MG tablet Take 1 tablet (40 mg total) by mouth daily. 90 tablet 3   tiZANidine (ZANAFLEX) 4 MG tablet Take 1 tablet (4 mg total) by mouth at bedtime. 90 tablet 1   valACYclovir (VALTREX) 1000 MG tablet Take 1 tablet (1,000 mg total) by mouth daily. 90 tablet 3   benzonatate (TESSALON) 100 MG capsule Take 1 capsule (100 mg total) by mouth 2 (two) times  daily as needed for cough. 20 capsule 0   calcium citrate-vitamin D (CITRACAL+D) 315-200 MG-UNIT tablet Take 1 tablet by mouth 2 (two) times daily. (Patient not taking: Reported on 02/23/2022)     No facility-administered medications prior to visit.     Review of Systems:   Constitutional:   No  weight loss, night sweats,  Fevers, chills,  +fatigue, or  lassitude.  HEENT:   No headaches,  Difficulty swallowing,  Tooth/dental problems, or  Sore throat,                No sneezing, itching, ear ache,  +nasal congestion, post nasal drip,   CV:  No chest pain,  Orthopnea, PND, swelling in lower extremities,  anasarca, dizziness, palpitations, syncope.   GI  No n, abdominal pain, nausea, vomiting, diarrhea, change in bowel habits, loss of appetite, bloody stools.   Resp:  No chest wall deformity  Skin: no rash or lesions.  GU: no dysuria, change in color of urine, no urgency or frequency.  No flank pain, no hematuria   MS:  No joint pain or swelling.  No decreased range of motion.  No back pain.    Physical Exam  BP 120/80 (BP Location: Left Arm, Patient Position: Sitting, Cuff Size: Large)   Pulse 90   Temp 97.7 F (36.5 C) (Oral)   Ht 5\' 7"  (1.702 m)   Wt 204 lb 9.6 oz (92.8 kg)   SpO2 95%   BMI 32.04 kg/m   GEN: A/Ox3; pleasant , NAD, well nourished    HEENT:  [place]/AT,  EACs-clear, TMs-wnl, NOSE-clear, THROAT-clear, no lesions, no postnasal drip or exudate noted.   NECK:  Supple w/ fair ROM; no JVD; normal carotid impulses w/o bruits; no thyromegaly or nodules palpated; no lymphadenopathy.    RESP  few scattered rhonchi.  no accessory muscle use, no dullness to percussion Speaks in full sentences.  CARD:  RRR, no m/r/g, no peripheral edema, pulses intact, no cyanosis or clubbing.  GI:   Soft & nt; nml bowel sounds; no organomegaly or masses detected.   Musco: Warm bil, no deformities or joint swelling noted.   Neuro: alert, no focal deficits noted.    Skin: Warm, no lesions or rashes    Lab Results:  CBC     BNP No results found for: "BNP"  ProBNP No results found for: "PROBNP"  Imaging: DG Chest 2 View  Result Date: 02/23/2022 CLINICAL DATA:  Post COVID, shortness of breath EXAM: CHEST - 2 VIEW COMPARISON:  10/27/2020 FINDINGS: The heart size and mediastinal contours are within normal limits. Both lungs are clear. The visualized skeletal structures are unremarkable. IMPRESSION: No active cardiopulmonary disease. Electronically Signed   By: 10/29/2020 M.D.   On: 02/23/2022 13:07         Latest Ref Rng & Units 01/02/2021   10:58 AM  PFT Results   FVC-Pre L 2.57   FVC-Predicted Pre % 81   FVC-Post L 2.62   FVC-Predicted Post % 83   Pre FEV1/FVC % % 72   Post FEV1/FCV % % 75   FEV1-Pre L 1.85   FEV1-Predicted Pre % 78   FEV1-Post L 1.95   DLCO uncorrected ml/min/mmHg 21.03   DLCO UNC% % 99   DLCO corrected ml/min/mmHg 21.03   DLCO COR %Predicted % 99   DLVA Predicted % 121   TLC L 5.68   TLC % Predicted % 103   RV % Predicted % 130  No results found for: "NITRICOXIDE"      Assessment & Plan:   Asthma Acute asthmatic bronchitic exacerbation.  Recent COVID-19 infection. Chest x-ray today is clear.  Will treat with empiric antibiotics and steroids. Add Breo.  Albuterol nebulizer treatment given in the office.  Neb machine sent to the DME company.   Plan  Patient Instructions  Begin Zpack take as directed Prednisone taper over next week  Delsym 2 tsp Twice daily  As needed  cough Tessalon Three times a day  as needed cough  Saline nasal rinses As needed   Zyrtec daily  May try Chlorpheniramine 4mg  At bedtime  As needed  drainage  Continue on Protonix daily  GERD diet  Add Pepcid 20mg  At bedtime  for 2 weeks and then as needed for reflux  Begin BREO 1 puff daily, rinse after use.  Albuterol inhaler or neb As needed   Order for [employer].  Follow up with Dr. Lamonte Sakai  in 3-4 weeks and As needed   Please contact office for sooner follow up if symptoms do not improve or worsen or seek emergency care       Rexene Edison, NP 02/23/2022

## 2022-02-23 NOTE — Telephone Encounter (Signed)
Spoke with the pt  She states tested pos for covid 19 3 wks ago  She called her PCP after 5 days of symptoms so out of the window for antiviral  She states she has been coughing a lot, esp at night- non prod  She is wheezing and gets SOB with walking up stairs She gets light headed when she feels SOB  She is using tessalon pearles, albuterol, pantoprazole, zyrtec She feels tessalon and albuterol are not helping  Limited providers in clinic and no openings this wk  She is not having fevers, aches, sweats Please advise thanks!  Allergies  Allergen Reactions   Collagen     Redness, swelling

## 2022-02-23 NOTE — Telephone Encounter (Signed)
I can see her today if she can come onto the office now.  Order chest x-ray-stat .  Needs to get here by 11 or she can come at 130  Please contact office for sooner follow up if symptoms do not improve or worsen or seek emergency care

## 2022-02-23 NOTE — Patient Instructions (Addendum)
Begin Zpack take as directed Prednisone taper over next week  Delsym 2 tsp Twice daily  As needed  cough Tessalon Three times a day  as needed cough  Saline nasal rinses As needed   Zyrtec daily  May try Chlorpheniramine 4mg  At bedtime  As needed  drainage  Continue on Protonix daily  GERD diet  Add Pepcid 20mg  At bedtime  for 2 weeks and then as needed for reflux  Begin BREO 1 puff daily, rinse after use.  Albuterol inhaler or neb As needed   Order for .  Follow up with Dr.  in 3-4 weeks and As needed   Please contact office for sooner follow up if symptoms do not improve or worsen or seek emergency care

## 2022-02-25 ENCOUNTER — Other Ambulatory Visit (HOSPITAL_COMMUNITY): Payer: Self-pay

## 2022-03-07 ENCOUNTER — Ambulatory Visit
Admission: RE | Admit: 2022-03-07 | Discharge: 2022-03-07 | Disposition: A | Discharge status: Home / Self Care | Payer: Medicare Other | Source: Ambulatory Visit | Attending: Orthopedic Surgery | Admitting: Orthopedic Surgery

## 2022-03-07 DIAGNOSIS — M545 Low back pain, unspecified: Secondary | ICD-10-CM

## 2022-03-10 ENCOUNTER — Ambulatory Visit: Payer: Medicare Other | Admitting: Nurse Practitioner

## 2022-03-22 ENCOUNTER — Encounter: Payer: Self-pay | Admitting: Orthopedic Surgery

## 2022-03-22 ENCOUNTER — Ambulatory Visit (INDEPENDENT_AMBULATORY_CARE_PROVIDER_SITE_OTHER): Payer: Medicare Other | Admitting: Orthopedic Surgery

## 2022-03-22 DIAGNOSIS — M545 Low back pain, unspecified: Secondary | ICD-10-CM | POA: Diagnosis not present

## 2022-03-22 MED ORDER — HYDROCODONE-ACETAMINOPHEN 5-325 MG PO TABS: 1.0000 | ORAL_TABLET | Freq: Two times a day (BID) | ORAL | 0 refills | Status: DC | PRN

## 2022-03-22 NOTE — Progress Notes (Signed)
Office Visit Note   Patient: Jillian Guerrero           Date of Birth: 10/20/1945           MRN: [id number] Visit Date: 03/22/2022 Requested by: Farrel Conners, [address] [place],  [place] [id number] PCP: Farrel Conners, MD  Subjective: Chief Complaint  Patient presents with   Other     Review MRI lumbar spine    HPI: Jillian Guerrero is a 77 y.o. female who presents to the office reporting low back pain and right knee pain.  She also has had an MRI scan since she was last seen.  Sounds like she had some type of procedure in 2019 for L1 compression fracture.  She was okay for 2 years but then her symptoms came back and they have been significantly worse over the last 5 months.  Pain does wake her from sleep at night.  She feels like she has a band device on her back.  She has had injections in her back which sound like epidural steroid injections a long time ago at another location.  The MRI scan shows no acute fracture.  There is an L1 vertebral body compression fracture with what appears to be to be at least 50% if not more loss of height with some mild marrow edema along the superior posterior corner of L1 with some retropulsion of the superior portion margin of the L1 vertebral body.  In general it looks like she has some focal kyphosis around that thoracolumbar junction.  Much more so than normal.  Her right knee is doing very well after the injection 3 months ago..                ROS: All systems reviewed are negative as they relate to the chief complaint within the history of present illness.  Patient denies fevers or chills.  Assessment & Plan: Visit Diagnoses:  1. Low back pain, unspecified back pain laterality, unspecified chronicity, unspecified whether sciatica present     Plan: Impression is low back pain with compression fracture and kyphosis.  Would like to refer her for consult with Dr. Laurance Flatten for the question of does she need surgery or should she try an  injection.  In general I think she has some sagittal spine imbalance from that compression fracture which may need to be addressed.  Plan to see her back as needed for any further knee injections.  Follow-Up Instructions: No follow-ups on file.   Orders:  No orders of the defined types were placed in this encounter.  Meds ordered this encounter  Medications   HYDROcodone-acetaminophen (NORCO/VICODIN) 5-325 MG tablet    Sig: Take 1 tablet by mouth every 12 (twelve) hours as needed for severe pain.    Dispense:  20 tablet    Refill:  0      Procedures: No procedures performed   Clinical Data: No additional findings.  Objective: Vital Signs: There were no vitals taken for this visit.  Physical Exam:  Constitutional: Patient appears well-developed HEENT:  Head: Normocephalic Eyes:EOM are normal Neck: Normal range of motion Cardiovascular: Normal rate Pulmonary/chest: Effort normal Neurologic: Patient is alert Skin: Skin is warm Psychiatric: Patient has normal mood and affect  Ortho Exam: Ortho exam demonstrates mild pain with forward lateral bending.  5 out of 5 ankle dorsiflexion plantarflexion quad and hamstring strength.  She has good hip flexion strength as well.  No masses lymphadenopathy or skin  changes noted in that back or leg region.  No trochanteric tenderness is present.  No effusion in either knees with intact extensor mechanisms bilaterally.  Specialty Comments:  No specialty comments available.  Imaging: No results found.   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   Past Medical History:  Diagnosis  Date   GERD (gastroesophageal reflux disease)    Hyperlipidemia    Lumbar compression fracture (HCC)    Osteoporosis    Peripheral neuropathy     Family History  Problem Relation Age of Onset   Arthritis Mother    Heart disease Mother    Arthritis Father    Depression Father    Arthritis Brother    High blood pressure Brother    High Cholesterol Brother    Arthritis Maternal Grandfather    Asthma Paternal Grandmother    Depression Paternal Grandmother    Heart disease Paternal Grandmother    High Cholesterol Paternal Grandmother    Arthritis Son    Heart attack Son    High Cholesterol Son    Arthritis Daughter     Past Surgical History:  Procedure Laterality Date   APPENDECTOMY  1956   BLADDER SUSPENSION  2003   BREAST BIOPSY  2015   hyaluronic acid viscosupplementation Bilateral    knees - 01/16/2018 x2   IR RADIOLOGIST EVAL & MGMT  01/21/2021   KYPHOPLASTY  10/2018   TONSILLECTOMY  1964   UPPER GI ENDOSCOPY  03/08/2019   gastritis   Social History   Occupational History   Not on file  Tobacco Use   Smoking status: Former    Packs/day: 1.00    Years: 40.00    Total pack years: 40.00    Types: Cigarettes   Smokeless tobacco: Never  Vaping Use   Vaping Use: Never used  Substance and Sexual Activity   Alcohol use: Never   Drug use: Never   Sexual activity: Not on file

## 2022-03-23 ENCOUNTER — Ambulatory Visit (INDEPENDENT_AMBULATORY_CARE_PROVIDER_SITE_OTHER): Payer: Medicare Other | Admitting: Adult Health

## 2022-03-23 ENCOUNTER — Encounter: Payer: Self-pay | Admitting: Adult Health

## 2022-03-23 VITALS — BP 120/70 | HR 84 | Ht 67.0 in | Wt 207.6 lb

## 2022-03-23 DIAGNOSIS — J4531 Mild persistent asthma with (acute) exacerbation: Secondary | ICD-10-CM | POA: Diagnosis not present

## 2022-03-23 DIAGNOSIS — K219 Gastro-esophageal reflux disease without esophagitis: Secondary | ICD-10-CM

## 2022-03-23 DIAGNOSIS — J310 Chronic rhinitis: Secondary | ICD-10-CM

## 2022-03-23 NOTE — Assessment & Plan Note (Signed)
Controlled on PPI °

## 2022-03-23 NOTE — Patient Instructions (Addendum)
Delsym 2 tsp Twice daily  As needed  cough Tessalon Three times a day  as needed cough  Saline nasal rinses As needed   Zyrtec daily.  Continue on Protonix daily  GERD diet  Albuterol inhaler or neb As needed   Prevnar 20 vaccine today  Recommend RSV vaccine next month  Follow up with Dr. Lamonte Sakai  in 3-4 months  and As needed   Please contact office for sooner follow up if symptoms do not improve or worsen or seek emergency care

## 2022-03-23 NOTE — Assessment & Plan Note (Signed)
Recent slow to resolve asthmatic bronchitis after COVID-19.  Patient is much improved symptoms seem to be back to baseline.  For now remain off of maintenance inhaler.  Patient says she just does not feel like she needs a every day inhaler.  Will have her return back in 3 to 4 months if has increased symptom burden and/or increased albuterol use can consider adding in a maintenance inhaler or possibly changing to [place] Patient Instructions  Delsym 2 tsp Twice daily  As needed  cough Tessalon Three times a day  as needed cough  Saline nasal rinses As needed   Zyrtec daily.  Continue on Protonix daily  GERD diet  Albuterol inhaler or neb As needed   Prevnar 20 vaccine today  Recommend RSV vaccine next month  Follow up with Dr. Lamonte Sakai  in 3-4 months  and As needed   Please contact office for sooner follow up if symptoms do not improve or worsen or seek emergency care

## 2022-03-23 NOTE — Progress Notes (Signed)
@Patient  ID: Jillian Guerrero, female    DOB: 10-01-1945, 77 y.o.   MRN: [id number]  No chief complaint on file.   Referring provider: Farrel Conners, MD  HPI: 77 year old female former smoker followed for mild COPD with asthma.  Medical history significant for vocal cord polyps  TEST/EVENTS :   03/23/2022 Follow up : COPD, Asthma  Patient presents for a 1 month follow-up.  Patient was seen last visit with an asthmatic bronchitic/COPD exacerbation.  She had had COVID-19 infection a few weeks before her last visit.  She was treated with a Z-Pak and a prednisone burst.  She was also given Breo.  Since last visit patient is feeling so much better.  She says her cough and congestion have totally resolved.  Wheezing has improved occasionally has some intermittent wheezing but does not feel like it is bad.  Has had no albuterol use.  Patient stopped using her Memory Dance does not feel like she needs it every day.  She does take Zyrtec and Protonix daily.  Says that this really seems to control her symptoms a lot. Patient says she remains very active.  Allergies  Allergen Reactions   Collagen     Redness, swelling    Immunization History  Administered Date(s) Administered   Fluad Quad(high Dose 65+) 11/10/2021   Hepatitis A 01/02/2014   Hepatitis A, Adult 01/02/2014, 02/11/2014   Influenza-Unspecified 02/22/2020   Moderna Sars-Covid-2 Vaccination 06/12/2019, 07/11/2019, 04/08/2020, 01/19/2021   Pneumococcal Conjugate-13 12/18/2013   Pneumococcal Polysaccharide-23 09/12/2012   Td 11/08/2013   Tdap 01/02/2014   Typhoid Inactivated 01/02/2014   Typhoid Live 01/02/2014   Yellow Fever 01/02/2014   Zoster Recombinat (Shingrix) 12/12/2020, 04/01/2021    Past Medical History:  Diagnosis Date   GERD (gastroesophageal reflux disease)    Hyperlipidemia    Lumbar compression fracture (HCC)    Osteoporosis    Peripheral neuropathy     Tobacco History: Social History   Tobacco Use   Smoking Status Former   Packs/day: 1.00   Years: 40.00   Total pack years: 40.00   Types: Cigarettes  Smokeless Tobacco Never   Counseling given: Not Answered   Outpatient Medications Prior to Visit  Medication Sig Dispense Refill   alendronate (FOSAMAX) 70 MG tablet Take 1 tablet (70 mg total) by mouth every 7 (seven) days. Take with a full glass of water on an empty stomach. 12 tablet 3   atorvastatin (LIPITOR) 10 MG tablet Take 1 tablet (10 mg total) by mouth daily. 90 tablet 3   calcium citrate-vitamin D (CITRACAL+D) 315-200 MG-UNIT tablet Take 1 tablet by mouth 2 (two) times daily.     cetirizine (ZYRTEC) 10 MG tablet Take 10 mg by mouth daily.     gabapentin (NEURONTIN) 600 MG tablet Take 1 tablet (600 mg total) by mouth 2 (two) times daily. 180 tablet 1   HYDROcodone-acetaminophen (NORCO/VICODIN) 5-325 MG tablet Take 1 tablet by mouth every 12 (twelve) hours as needed for severe pain. 20 tablet 0   naproxen (NAPROSYN) 500 MG tablet Bid x 10 days then q d 30 tablet 0   pantoprazole (PROTONIX) 40 MG tablet Take 1 tablet (40 mg total) by mouth daily. 90 tablet 3   tiZANidine (ZANAFLEX) 4 MG tablet Take 1 tablet (4 mg total) by mouth at bedtime. 90 tablet 1   valACYclovir (VALTREX) 1000 MG tablet Take 1 tablet (1,000 mg total) by mouth daily. 90 tablet 3   albuterol (PROVENTIL) (2.5 MG/3ML) 0.083% nebulizer solution  Take 3 mLs (2.5 mg total) by nebulization every 6 (six) hours as needed for wheezing or shortness of breath. 75 mL 5   albuterol (VENTOLIN HFA) 108 (90 Base) MCG/ACT inhaler Inhale 2 puffs into the lungs every 6 (six) hours as needed for wheezing or shortness of breath. 8 g 1   benzonatate (TESSALON) 200 MG capsule Take 1 capsule (200 mg total) by mouth 3 (three) times daily as needed. 45 capsule 1   fluticasone furoate-vilanterol (BREO ELLIPTA) 100-25 MCG/ACT AEPB Inhale 1 puff into the lungs daily. 30 each 5   predniSONE (DELTASONE) 10 MG tablet 4 tabs for 2 days, then 3  tabs for 2 days, 2 tabs for 2 days, then 1 tab for 2 days, then stop 20 tablet 0   Facility-Administered Medications Prior to Visit  Medication Dose Route Frequency Provider Last Rate Last Admin   albuterol (PROVENTIL) (2.5 MG/3ML) 0.083% nebulizer solution 2.5 mg  2.5 mg Nebulization Once Gabriela Irigoyen S, NP         Review of Systems:   Constitutional:   No  weight loss, night sweats,  Fevers, chills, fatigue, or  lassitude.  HEENT:   No headaches,  Difficulty swallowing,  Tooth/dental problems, or  Sore throat,                No sneezing, itching, ear ache,  +nasal congestion, post nasal drip,   CV:  No chest pain,  Orthopnea, PND, swelling in lower extremities, anasarca, dizziness, palpitations, syncope.   GI  No heartburn, indigestion, abdominal pain, nausea, vomiting, diarrhea, change in bowel habits, loss of appetite, bloody stools.   Resp: No shortness of breath with exertion or at rest.  No excess mucus, no productive cough,  No non-productive cough,  No coughing up of blood.  No change in color of mucus.  No wheezing.  No chest wall deformity  Skin: no rash or lesions.  GU: no dysuria, change in color of urine, no urgency or frequency.  No flank pain, no hematuria   MS:  No joint pain or swelling.  No decreased range of motion.  No back pain.    Physical Exam  BP 120/70   Pulse 84   Ht 5\' 7"  (1.702 m)   Wt 207 lb 9.6 oz (94.2 kg)   SpO2 97%   BMI 32.51 kg/m   GEN: A/Ox3; pleasant , NAD, well nourished    HEENT:  [place]/AT,   NOSE-clear, THROAT-clear, no lesions, no postnasal drip or exudate noted.   NECK:  Supple w/ fair ROM; no JVD; normal carotid impulses w/o bruits; no thyromegaly or nodules palpated; no lymphadenopathy.    RESP  Clear  P & A; w/o, wheezes/ rales/ or rhonchi. no accessory muscle use, no dullness to percussion  CARD:  RRR, no m/r/g, no peripheral edema, pulses intact, no cyanosis or clubbing.  GI:   Soft & nt; nml bowel sounds; no  organomegaly or masses detected.   Musco: Warm bil, no deformities or joint swelling noted.   Neuro: alert, no focal deficits noted.    Skin: Warm, no lesions or rashes    Lab Results: What are your   BNP No results found for: "BNP"  ProBNP No results found for: "PROBNP"  Imaging:        Latest Ref Rng & Units 01/02/2021   10:58 AM  PFT Results  FVC-Pre L 2.57   FVC-Predicted Pre % 81   FVC-Post L 2.62   FVC-Predicted Post %  83   Pre FEV1/FVC % % 72   Post FEV1/FCV % % 75   FEV1-Pre L 1.85   FEV1-Predicted Pre % 78   FEV1-Post L 1.95   DLCO uncorrected ml/min/mmHg 21.03   DLCO UNC% % 99   DLCO corrected ml/min/mmHg 21.03   DLCO COR %Predicted % 99   DLVA Predicted % 121   TLC L 5.68   TLC % Predicted % 103   RV % Predicted % 130     No results found for: "NITRICOXIDE"      Assessment & Plan:   Asthma Recent slow to resolve asthmatic bronchitis after COVID-19.  Patient is much improved symptoms seem to be back to baseline.  For now remain off of maintenance inhaler.  Patient says she just does not feel like she needs a every day inhaler.  Will have her return back in 3 to 4 months if has increased symptom burden and/or increased albuterol use can consider adding in a maintenance inhaler or possibly changing to [employer]   Plan Patient Instructions  Delsym 2 tsp Twice daily  As needed  cough Tessalon Three times a day  as needed cough  Saline nasal rinses As needed   Zyrtec daily.  Continue on Protonix daily  GERD diet  Albuterol inhaler or neb As needed   Prevnar 20 vaccine today  Recommend RSV vaccine next month  Follow up with Dr. Delton Coombes  in 3-4 months  and As needed   Please contact office for sooner follow up if symptoms do not improve or worsen or seek emergency care     Chronic rhinitis Continue on current regimen   Plan  Patient Instructions  Delsym 2 tsp Twice daily  As needed  cough Tessalon Three times a day  as needed cough   Saline nasal rinses As needed   Zyrtec daily.  Continue on Protonix daily  GERD diet  Albuterol inhaler or neb As needed   Prevnar 20 vaccine today  Recommend RSV vaccine next month  Follow up with Dr. Delton Coombes  in 3-4 months  and As needed   Please contact office for sooner follow up if symptoms do not improve or worsen or seek emergency care     GERD (gastroesophageal reflux disease) Controlled on PPI     Djibril Glogowski, NP 03/23/2022

## 2022-03-23 NOTE — Assessment & Plan Note (Signed)
Continue on current regimen   Plan  Patient Instructions  Delsym 2 tsp Twice daily  As needed  cough Tessalon Three times a day  as needed cough  Saline nasal rinses As needed   Zyrtec daily.  Continue on Protonix daily  GERD diet  Albuterol inhaler or neb As needed   Prevnar 20 vaccine today  Recommend RSV vaccine next month  Follow up with Dr. Lamonte Sakai  in 3-4 months  and As needed   Please contact office for sooner follow up if symptoms do not improve or worsen or seek emergency care

## 2022-03-25 ENCOUNTER — Other Ambulatory Visit: Payer: Self-pay

## 2022-03-25 ENCOUNTER — Other Ambulatory Visit: Payer: Self-pay | Admitting: Surgical

## 2022-03-25 ENCOUNTER — Telehealth: Payer: Self-pay | Admitting: Orthopedic Surgery

## 2022-03-25 DIAGNOSIS — M545 Low back pain, unspecified: Secondary | ICD-10-CM

## 2022-03-25 MED ORDER — NAPROXEN 500 MG PO TABS: ORAL_TABLET | ORAL | 0 refills | Status: DC

## 2022-03-25 NOTE — Telephone Encounter (Signed)
I called patient and advised. 

## 2022-03-25 NOTE — Telephone Encounter (Signed)
I refilled naproxen prescription that she has had previously last year

## 2022-03-25 NOTE — Telephone Encounter (Signed)
Patient called asked when will she be referred to Dr. Laurance Flatten so that she can make an appointment?   Patient said she called [facility] and was told she had a medication (Hydrocodone with Codeine) and she can not take this medication because she live alone and it makes her very sleepy.   Patient asked if she can get a Rx for Naproxen 500 mg called into [facility] for her?   The number to contact patient is [phone]

## 2022-04-20 ENCOUNTER — Ambulatory Visit (INDEPENDENT_AMBULATORY_CARE_PROVIDER_SITE_OTHER): Payer: Medicare Other | Admitting: Orthopedic Surgery

## 2022-04-20 ENCOUNTER — Encounter: Payer: Self-pay | Admitting: Orthopedic Surgery

## 2022-04-20 ENCOUNTER — Ambulatory Visit (INDEPENDENT_AMBULATORY_CARE_PROVIDER_SITE_OTHER): Payer: Medicare Other

## 2022-04-20 VITALS — BP 123/85 | HR 73 | Ht 67.0 in | Wt 208.0 lb

## 2022-04-20 DIAGNOSIS — M545 Low back pain, unspecified: Secondary | ICD-10-CM | POA: Diagnosis not present

## 2022-04-21 NOTE — Progress Notes (Signed)
Orthopedic Spine Surgery Office Note  Assessment: Patient is a 77 y.o. female with low back pain who has an old L1 burst fracture with focal kyphosis above the fracture.   Plan: -Patient has tried Aleve, naproxen, Tylenol, activity modification  -I discussed the fact that I think her pain is coming from the kyphotic alignment above her old L1 burst fracture.  I told her it would be a large surgery to correct this especially if she has autofused between T12 and L1. For deformity surgery, I told her there is a major complication rate of [id number]. If we are thinking about surgery, I would need a CT scan to evaluate for that autofusion.  I we will obtain supine lateral x-rays at her next visit to see how flexible this deformity is.  Next, I will obtain scoliosis films to evaluate her standing alignment and SVA. -Patient should return to office in 6 weeks, x-rays at next visit: supine lateral lumbar x-ray, scoliosis   Patient expressed understanding of the plan and all questions were answered to the patient's satisfaction.   ___________________________________________________________________________   History:  Patient is a 77 y.o. female who presents today for lumbar spine.  Patient has noted onset of back pain in 2019 when she had a fall.  She sustained a L1 compression fracture.  She had severe pain and was not getting better with conservative treatment so she underwent kyphoplasty.  She says that the procedure was aborted because her blood pressure dropped intraoperatively.  Since then, she has had low back pain.  She feels it is worse if she is sitting or if she is active.  She is very active for her age and is frustrated because she is limited by her pain.  She has no pain radiating into her lower extremities.  States the pain is an 8 out of 10.   Weakness: Denies Symptoms of imbalance: Denies Paresthesias and numbness: Denies Bowel or bladder incontinence: Denies Saddle anesthesia:  Denies  Treatments tried: Aleve, naproxen, Tylenol, activity modification  Review of systems: Denies fevers and chills, night sweats, unexplained weight loss, history of cancer, pain that wakes them at night  Past medical history: Osteoporosis (being treated with fosamax) Vertigo GERD HLD  Allergies: NKDA  Past surgical history:  L1 kyphoplasty Appendectomy Bladder suspension Tonsillectomy  Social history: Denies use of nicotine product (smoking, vaping, patches, smokeless) Alcohol use: denies Denies recreational drug use   Physical Exam:  General: no acute distress, appears stated age Neurologic: alert, answering questions appropriately, following commands Respiratory: unlabored breathing on room air, symmetric chest rise Psychiatric: appropriate affect, normal cadence to speech   MSK (spine):  -Strength exam      Left  Right EHL    5/5  5/5 TA    5/5  5/5 GSC    5/5  5/5 Knee extension  5/5  5/5 Hip flexion   5/5  5/5  -Sensory exam    Sensation intact to light touch in L3-S1 nerve distributions of bilateral lower extremities  -Achilles DTR: 2/4 on the left, 2/4 on the right -Patellar tendon DTR: 2/4 on the left, 2/4 on the right  -Straight leg raise: negative bilaterally -Femoral nerve stretch test: negative bilaterally -Clonus: no beats bilaterally  -Left hip exam: No pain through range of motion, negative Stinchfield, negative FABER -Right hip exam: No pain through range of motion, negative Stinchfield, negative FABER  Imaging: XR of the lumbar spine from 04/20/2022 was independently reviewed and interpreted, showing disc height loss with  anterior osteophyte formation at L3/4.  L1 compression fracture with cement augmentation into the vertebral body.  Focal kyphosis above L1 with caudal migration of the T12 vertebrae into L1.  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  MRI of the lumbar spine from 03/07/2022 was independently  reviewed and interpreted, showing L1 burst fracture with retropulsion.  No significant central, lateral recess, foraminal stenosis.   Patient name: Jillian Guerrero Patient MRN: [id number] Date of visit: 04/21/22

## 2022-04-23 ENCOUNTER — Ambulatory Visit: Payer: Medicare Other | Admitting: Orthopedic Surgery

## 2022-05-03 ENCOUNTER — Ambulatory Visit (INDEPENDENT_AMBULATORY_CARE_PROVIDER_SITE_OTHER): Payer: Medicare Other | Admitting: Physical Medicine and Rehabilitation

## 2022-05-03 ENCOUNTER — Ambulatory Visit: Payer: Self-pay

## 2022-05-03 VITALS — BP 111/75 | HR 76

## 2022-05-03 DIAGNOSIS — M5416 Radiculopathy, lumbar region: Secondary | ICD-10-CM

## 2022-05-03 MED ORDER — METHYLPREDNISOLONE ACETATE 80 MG/ML IJ SUSP
80.0000 mg | Freq: Once | INTRAMUSCULAR | Status: AC
  Administered 2022-05-03: 80 mg

## 2022-05-03 NOTE — Progress Notes (Unsigned)
Functional Pain Scale - descriptive words and definitions  Distracting (5)    Aware of pain/able to complete some ADL's but limited by pain/sleep is affected and active distractions are only slightly useful. Moderate range order  Average Pain  varies   +Driver, -BT, -Dye Allergies.  Lower back pain on both sides that radiates into hips

## 2022-05-03 NOTE — Patient Instructions (Signed)

## 2022-05-04 ENCOUNTER — Encounter: Payer: Self-pay | Admitting: Family Medicine

## 2022-05-04 ENCOUNTER — Ambulatory Visit (INDEPENDENT_AMBULATORY_CARE_PROVIDER_SITE_OTHER): Payer: Medicare Other | Admitting: Family Medicine

## 2022-05-04 VITALS — BP 122/70 | HR 85 | Temp 97.9°F | Ht 66.25 in | Wt 208.0 lb

## 2022-05-04 DIAGNOSIS — E782 Mixed hyperlipidemia: Secondary | ICD-10-CM | POA: Diagnosis not present

## 2022-05-04 DIAGNOSIS — R319 Hematuria, unspecified: Secondary | ICD-10-CM

## 2022-05-04 DIAGNOSIS — R61 Generalized hyperhidrosis: Secondary | ICD-10-CM

## 2022-05-04 DIAGNOSIS — M545 Low back pain, unspecified: Secondary | ICD-10-CM | POA: Diagnosis not present

## 2022-05-04 DIAGNOSIS — G8929 Other chronic pain: Secondary | ICD-10-CM | POA: Diagnosis not present

## 2022-05-04 DIAGNOSIS — E559 Vitamin D deficiency, unspecified: Secondary | ICD-10-CM | POA: Diagnosis not present

## 2022-05-04 LAB — COMPREHENSIVE METABOLIC PANEL
ALT: 15 U/L (ref 0–35)
AST: 14 U/L (ref 0–37)
Albumin: 4.2 g/dL (ref 3.5–5.2)
Alkaline Phosphatase: 40 U/L (ref 39–117)
BUN: 14 mg/dL (ref 6–23)
CO2: 27 mEq/L (ref 19–32)
Calcium: 9.8 mg/dL (ref 8.4–10.5)
Chloride: 104 mEq/L (ref 96–112)
Creatinine, Ser: 0.67 mg/dL (ref 0.40–1.20)
GFR: 84.31 mL/min (ref >60.00)
Glucose, Bld: 125 mg/dL — ABNORMAL HIGH (ref 70–99)
Potassium: 4 mEq/L (ref 3.5–5.1)
Sodium: 141 mEq/L (ref 135–145)
Total Bilirubin: 0.4 mg/dL (ref 0.2–1.2)
Total Protein: 6.9 g/dL (ref 6.0–8.3)

## 2022-05-04 LAB — CBC WITH DIFFERENTIAL/PLATELET
Basophils Absolute: 0 10*3/uL (ref 0.0–0.1)
Basophils Relative: 0.3 % (ref 0.0–3.0)
Eosinophils Absolute: 0 10*3/uL (ref 0.0–0.7)
Eosinophils Relative: 0.1 % (ref 0.0–5.0)
HCT: 42 % (ref 36.0–46.0)
Hemoglobin: 14.3 g/dL (ref 12.0–15.0)
Lymphocytes Relative: 7.5 % — ABNORMAL LOW (ref 12.0–46.0)
Lymphs Abs: 0.9 10*3/uL (ref 0.7–4.0)
MCHC: 34 g/dL (ref 30.0–36.0)
MCV: 91.9 fl (ref 78.0–100.0)
Monocytes Absolute: 0.6 10*3/uL (ref 0.1–1.0)
Monocytes Relative: 4.9 % (ref 3.0–12.0)
Neutro Abs: 10.4 10*3/uL — ABNORMAL HIGH (ref 1.4–7.7)
Neutrophils Relative %: 87.2 % — ABNORMAL HIGH (ref 43.0–77.0)
Platelets: 238 10*3/uL (ref 150.0–400.0)
RBC: 4.56 Mil/uL (ref 3.87–5.11)
RDW: 14 % (ref 11.5–15.5)
WBC: 12 10*3/uL — ABNORMAL HIGH (ref 4.0–10.5)

## 2022-05-04 LAB — URINALYSIS, ROUTINE W REFLEX MICROSCOPIC
Bilirubin Urine: NEGATIVE
Ketones, ur: NEGATIVE
Leukocytes,Ua: NEGATIVE
Nitrite: NEGATIVE
Specific Gravity, Urine: 1.025 (ref 1.000–1.030)
Total Protein, Urine: NEGATIVE
Urine Glucose: NEGATIVE
Urobilinogen, UA: 0.2 (ref 0.0–1.0)
pH: 6 (ref 5.0–8.0)

## 2022-05-04 LAB — TSH: TSH: 0.68 u[IU]/mL (ref 0.35–5.50)

## 2022-05-04 LAB — LIPID PANEL
Cholesterol: 179 mg/dL (ref 0–200)
HDL: 66.5 mg/dL (ref >39.00)
LDL Cholesterol: 87 mg/dL (ref 0–99)
NonHDL: 112.95
Total CHOL/HDL Ratio: 3
Triglycerides: 130 mg/dL (ref 0.0–149.0)
VLDL: 26 mg/dL (ref 0.0–40.0)

## 2022-05-04 NOTE — Assessment & Plan Note (Signed)
On atorvastatin 10 mg daily, will order a new Lipid panel today.

## 2022-05-04 NOTE — Assessment & Plan Note (Signed)
With osteoporosis, needs new vitamin D level today for surveillance, pt reports she is taking both calcium and vitamin D

## 2022-05-04 NOTE — Progress Notes (Signed)
Jillian Guerrero - 77 y.o. female MRN [id number]  Date of birth: May 25, 1945  Office Visit Note: Visit Date: 05/03/2022 PCP: Farrel Conners, MD Referred by: Callie Fielding, MD  Subjective: Chief Complaint  Patient presents with   Lower Back - Pain   HPI:  Jillian Guerrero is a 77 y.o. female who comes in today at the request of Dr. Ileene Rubens for planned Right L1-2 Lumbar Interlaminar epidural steroid injection with fluoroscopic guidance.  The patient has failed conservative care including home exercise, medications, time and activity modification.  This injection will be diagnostic and hopefully therapeutic.  Please see requesting physician notes for further details and justification.   ROS Otherwise per HPI.  Assessment & Plan: Visit Diagnoses:    ICD-10-CM   1. Lumbar radiculopathy  M54.16 XR C-ARM NO REPORT    Epidural Steroid injection    methylPREDNISolone acetate (DEPO-MEDROL) injection 80 mg      Plan: No additional findings.   Meds & Orders:  Meds ordered this encounter  Medications   methylPREDNISolone acetate (DEPO-MEDROL) injection 80 mg    Orders Placed This Encounter  Procedures   XR C-ARM NO REPORT   Epidural Steroid injection    Follow-up: Return for visit to requesting provider as needed.   Procedures: No procedures performed  Lumbar Epidural Steroid Injection - Interlaminar Approach with Fluoroscopic Guidance  Patient: Jillian Guerrero      Date of Birth: [birth year] MRN: [id number] PCP: Farrel Conners, MD      Visit Date: 05/03/2022   Universal Protocol:     Consent Given By: the patient  Position: PRONE  Additional Comments: Vital signs were monitored before and after the procedure. Patient was prepped and draped in the usual sterile fashion. The correct patient, procedure, and site was verified.   Injection Procedure Details:   Procedure diagnoses: Lumbar radiculopathy [M54.16]   Meds Administered:  Meds ordered this  encounter  Medications   methylPREDNISolone acetate (DEPO-MEDROL) injection 80 mg     Laterality: Right  Location/Site:  L1-2  Needle: 3.5 in., 20 ga. Tuohy  Needle Placement: Paramedian epidural  Findings:   -Comments: Excellent flow of contrast into the epidural space.  Procedure Details: Using a paramedian approach from the side mentioned above, the region overlying the inferior lamina was localized under fluoroscopic visualization and the soft tissues overlying this structure were infiltrated with 4 ml. of 1% Lidocaine without Epinephrine. The Tuohy needle was inserted into the epidural space using a paramedian approach.   The epidural space was localized using loss of resistance along with counter oblique bi-planar fluoroscopic views.  After negative aspirate for air, blood, and CSF, a 2 ml. volume of Isovue-250 was injected into the epidural space and the flow of contrast was observed. Radiographs were obtained for documentation purposes.    The injectate was administered into the level noted above.   Additional Comments:  The patient tolerated the procedure well Dressing: 2 x 2 sterile gauze and Band-Aid    Post-procedure details: Patient was observed during the procedure. Post-procedure instructions were reviewed.  Patient left the clinic in stable condition.   Clinical History: MRI LUMBAR SPINE WITHOUT CONTRAST   TECHNIQUE: Multiplanar, multisequence MR imaging of the lumbar spine was performed. No intravenous contrast was administered.   COMPARISON:  10/20/2020   FINDINGS: Segmentation:  Standard.   Alignment:  Physiologic.   Vertebrae: No acute fracture, evidence of discitis, or aggressive bone lesion. L1 vertebral body compression fracture unchanged  compared with 10/20/2020 with 50% height loss and mild marrow edema along the superior posterior corner of the L1 vertebral body. 4 mm retropulsion of the superior posterior margin of the L1 vertebral body  mildly impressing upon the thecal sac and resulting in mild spinal stenosis. Benign hemangioma in the L4 vertebral body.   Conus medullaris and cauda equina: Conus extends to the L1 level. Conus and cauda equina appear normal.   Paraspinal and other soft tissues: No acute paraspinal abnormality.   Disc levels:   Disc spaces: Disc desiccation throughout the lumbar spine. Mild disc height loss at L3-4.   T11-12: Mild broad-based disc bulge. Moderate right and mild left facet arthropathy. No foraminal or central canal stenosis.   T12-L1: Broad-based disc bulge. Mild bilateral facet arthropathy. No foraminal or central canal stenosis.   L1-L2: Mild broad-based disc bulge. Mild bilateral facet arthropathy. Mild bilateral foraminal stenosis.   L2-L3: Mild broad-based disc bulge. Mild bilateral facet arthropathy. No foraminal or central canal stenosis.   L3-L4: Mild broad-based disc bulge. Moderate bilateral facet arthropathy. Mild spinal stenosis. Bilateral lateral recess stenosis. Mild bilateral foraminal stenosis.   L4-L5: Mild broad-based disc bulge. Mild bilateral facet arthropathy. No foraminal or central canal stenosis.   L5-S1: Mild broad-based disc bulge. Moderate bilateral facet arthropathy. No foraminal or central canal stenosis.   IMPRESSION: 1. L1 vertebral body compression fracture unchanged compared with 10/20/2020 with 50% height loss and mild residual marrow edema along the superior posterior corner of the L1 vertebral body. 4 mm retropulsion of the superior posterior margin of the L1 vertebral body mildly impressing upon the thecal sac and resulting in mild spinal stenosis. 2. Lumbar spine spondylosis as described above.     Electronically Signed   By: Kathreen Devoid M.D.   On: 03/08/2022 15:34     Objective:  VS:  HT:    WT:   BMI:     BP:111/75  HR:76bpm  TEMP: ( )  RESP:  Physical Exam Vitals and nursing note reviewed.  Constitutional:       General: She is not in acute distress.    Appearance: Normal appearance. She is not ill-appearing.  HENT:     Head: Normocephalic and atraumatic.     Right Ear: External ear normal.     Left Ear: External ear normal.  Eyes:     Extraocular Movements: Extraocular movements intact.  Cardiovascular:     Rate and Rhythm: Normal rate.     Pulses: Normal pulses.  Pulmonary:     Effort: Pulmonary effort is normal. No respiratory distress.  Abdominal:     General: There is no distension.     Palpations: Abdomen is soft.  Musculoskeletal:        General: Tenderness present.     Cervical back: Neck supple.     Right lower leg: No edema.     Left lower leg: No edema.     Comments: Patient has good distal strength with no pain over the greater trochanters.  No clonus or focal weakness.  Skin:    Findings: No erythema, lesion or rash.  Neurological:     General: No focal deficit present.     Mental Status: She is alert and oriented to person, place, and time.     Sensory: No sensory deficit.     Motor: No weakness or abnormal muscle tone.     Coordination: Coordination normal.  Psychiatric:        Mood and Affect: Mood  normal.        Behavior: Behavior normal.      Imaging: No results found.

## 2022-05-04 NOTE — Procedures (Signed)
Lumbar Epidural Steroid Injection - Interlaminar Approach with Fluoroscopic Guidance  Patient: Jillian Guerrero      Date of Birth: 1946-02-06 MRN: [id number] PCP: Farrel Conners, MD      Visit Date: 05/03/2022   Universal Protocol:     Consent Given By: the patient  Position: PRONE  Additional Comments: Vital signs were monitored before and after the procedure. Patient was prepped and draped in the usual sterile fashion. The correct patient, procedure, and site was verified.   Injection Procedure Details:   Procedure diagnoses: Lumbar radiculopathy [M54.16]   Meds Administered:  Meds ordered this encounter  Medications   methylPREDNISolone acetate (DEPO-MEDROL) injection 80 mg     Laterality: Right  Location/Site:  L1-2  Needle: 3.5 in., 20 ga. Tuohy  Needle Placement: Paramedian epidural  Findings:   -Comments: Excellent flow of contrast into the epidural space.  Procedure Details: Using a paramedian approach from the side mentioned above, the region overlying the inferior lamina was localized under fluoroscopic visualization and the soft tissues overlying this structure were infiltrated with 4 ml. of 1% Lidocaine without Epinephrine. The Tuohy needle was inserted into the epidural space using a paramedian approach.   The epidural space was localized using loss of resistance along with counter oblique bi-planar fluoroscopic views.  After negative aspirate for air, blood, and CSF, a 2 ml. volume of Isovue-250 was injected into the epidural space and the flow of contrast was observed. Radiographs were obtained for documentation purposes.    The injectate was administered into the level noted above.   Additional Comments:  The patient tolerated the procedure well Dressing: 2 x 2 sterile gauze and Band-Aid    Post-procedure details: Patient was observed during the procedure. Post-procedure instructions were reviewed.  Patient left the clinic in stable  condition.

## 2022-05-04 NOTE — Progress Notes (Signed)
Established Patient Office Visit  Subjective   Patient ID: Jillian Guerrero, female    DOB: [birth year]  Age: 77 y.o. MRN: [id number]  Chief Complaint  Patient presents with   Back Pain    X5 weeks, no known injury, seen by orthopedic provider and given an injection and requests lab tests be performed to see if there could be another problem for the pain    Pt is here for 6 month follow up  Lumbar radiculopathy-- pt reports she has been seeing the orthopedist regularly for injections in her spine and her leg pain. States she just had an injection in her spine yesterday. States that the ones for her knees are doing well. States that about 5 weeks ago her back pain got worse, states that the pain is in the lower part of the spine, was having trouble sitting, radiating down into her buttocks. States she cannot sleep with the pain either. Has been taking naprosyn daily also. Patient reports she is having morning sweats, states she wakes up every morning very sweaty, would like to have her labs checked to make sure there is nothing else going on.    Current Outpatient Medications  Medication Instructions   alendronate (FOSAMAX) 70 mg, Oral, Every 7 days, Take with a full glass of water on an empty stomach.   atorvastatin (LIPITOR) 10 mg, Oral, Daily   calcium citrate-vitamin D (CITRACAL+D) 315-200 MG-UNIT tablet 1 tablet, Oral, 2 times daily   cetirizine (ZYRTEC) 10 mg, Oral, Daily   gabapentin (NEURONTIN) 600 mg, Oral, 2 times daily   naproxen (NAPROSYN) 500 MG tablet Bid x 10 days then q d   pantoprazole (PROTONIX) 40 mg, Oral, Daily   tiZANidine (ZANAFLEX) 4 mg, Oral, Daily at bedtime   valACYclovir (VALTREX) 1,000 mg, Oral, Daily    Patient Active Problem List   Diagnosis Date Noted   Herpes zoster 11/10/2021   Peripheral neuropathy 11/10/2021   Chronic rhinitis 01/02/2021   Chronic cough 11/27/2020   Hyperlipidemia 01/20/2020   Asthma 01/20/2020   GERD (gastroesophageal reflux  disease) 01/20/2020   Osteoporosis 01/20/2020   Vitamin D deficiency 01/20/2020   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     Objective:     BP 122/70 (BP Location: Left Arm, Patient Position: Sitting, Cuff Size: Large)   Pulse 85   Temp 97.9 F (36.6 C) (Oral)   Ht 5' 6.25" (1.683 m)   Wt 208 lb (94.3 kg)   SpO2 98%   BMI 33.32 kg/m    Physical Exam Vitals reviewed.  Constitutional:      Appearance: Normal appearance. She is well-groomed and normal weight.  Eyes:     Conjunctiva/sclera: Conjunctivae normal.  Neck:     Thyroid: No thyromegaly.  Cardiovascular:     Rate and Rhythm: Regular rhythm.     Pulses: Normal pulses.     Heart sounds: S1 normal and S2 normal.  Pulmonary:     Effort: Pulmonary effort is normal.     Breath sounds: Normal breath sounds and air entry.  Abdominal:     General: Bowel sounds are normal.  Musculoskeletal:     Right lower leg: No edema.     Left lower leg: No edema.  Neurological:     Mental Status: She is alert and oriented to person, place, and time. Mental status is at baseline.     Gait:  Gait is intact.  Psychiatric:        Mood and Affect: Mood and affect normal.        Speech: Speech normal.        Behavior: Behavior normal.        Judgment: Judgment normal.      No results found for any visits on 05/04/22.    The 10-year ASCVD risk score (Arnett DK, et al., 2019) is: 18%    Assessment & Plan:   Problem List Items Addressed This Visit       Unprioritized   Hyperlipidemia - Primary    On atorvastatin 10 mg daily, will order a new Lipid panel today.      Relevant Orders   Lipid Panel   CMP   Vitamin D deficiency    With osteoporosis, needs new vitamin D level today for surveillance, pt reports she is taking both calcium and vitamin D      Other Visit Diagnoses     Night sweats       Relevant Orders   Unclear etiology,  ordering CBC and TSH today to begin work up.   TSH   CBC with Differential/Platelets   Chronic bilateral low back pain without sciatica       Relevant Orders   Seeing ortho and getting injections, the one she got yesterday seems to be helping her with her pain, she wants to check her urine to ensure there are not other causes of her back pain.  Urinalysis with Reflex Microscopic       Return in about 6 months (around 11/04/2022).    Farrel Conners, MD

## 2022-05-05 NOTE — Addendum Note (Signed)
Addended by: Agnes Lawrence on: 05/05/2022 04:03 PM   Modules accepted: Orders

## 2022-05-19 ENCOUNTER — Other Ambulatory Visit: Payer: Medicare Other

## 2022-05-19 ENCOUNTER — Telehealth: Payer: Self-pay | Admitting: Family Medicine

## 2022-05-19 DIAGNOSIS — R319 Hematuria, unspecified: Secondary | ICD-10-CM

## 2022-05-19 DIAGNOSIS — K5909 Other constipation: Secondary | ICD-10-CM

## 2022-05-19 LAB — URINALYSIS
Bilirubin Urine: NEGATIVE
Hgb urine dipstick: NEGATIVE
Ketones, ur: NEGATIVE
Leukocytes,Ua: NEGATIVE
Nitrite: NEGATIVE
Specific Gravity, Urine: >=1.03 — AB (ref 1.000–1.030)
Total Protein, Urine: NEGATIVE
Urine Glucose: NEGATIVE
Urobilinogen, UA: 0.2 (ref 0.0–1.0)
pH: 5.5 (ref 5.0–8.0)

## 2022-05-19 NOTE — Telephone Encounter (Signed)
Pt would like a referral to a [employer] office. Would like a call back once referral is sent.   Please advise.

## 2022-05-19 NOTE — Telephone Encounter (Signed)
Spoke with the patient for more information as to the reason she was requesting a referral.  Patient stated she used to see a GI in [place] due to constipation.  Patient stated now she will go to the restroom, assuming she has to urinate and pants will be full of stool instead.  Stated this occurred 4 days last week and she would like a referral to GI.  Due to symptoms, I offered an appt today with Dr Elease Hashimoto and advised the patient a visit would be needed here prior to the referral, also it can also take some time to get in with GI if a referral is placed and she stated she will be fine with waiting for an appt.  Message sent to PCP.

## 2022-05-20 NOTE — Telephone Encounter (Signed)
Patient informed of the message below.

## 2022-06-02 ENCOUNTER — Ambulatory Visit (INDEPENDENT_AMBULATORY_CARE_PROVIDER_SITE_OTHER): Payer: Medicare Other | Admitting: Orthopedic Surgery

## 2022-06-02 DIAGNOSIS — G8929 Other chronic pain: Secondary | ICD-10-CM | POA: Diagnosis not present

## 2022-06-02 DIAGNOSIS — M545 Low back pain, unspecified: Secondary | ICD-10-CM

## 2022-06-02 NOTE — Progress Notes (Signed)
Orthopedic Spine Surgery Office Note  Assessment: Patient is a 77 y.o. female with low back pain who has an old L1 burst fracture with focal kyphosis above the fracture.  Got significant and lasting relief with an injection   Plan: -Since she got such great relief with the injection and it has been lasting, I recommended repeat injections as needed.  She will call the office if she becomes symptomatic again at which point I will likely refer her for repeat injection -She can continue with the naproxen as needed for pain relief -Patient will return to the office on an as-needed basis   Patient expressed understanding of the plan and all questions were answered to the patient's satisfaction.   ___________________________________________________________________________  History: Patient is a 77 y.o. female who has been previously seen in the office for low back pain.  Patient was having severe pain the last time I saw her.  There was no radicular symptoms.  Since the last visit, she received a lumbar steroid injection.  She said she got great relief with the injection and is still experiencing relief.  She said she feels like a new woman.  She is able to do a lot more activities without pain.  She is very pleased with the result of the injection in hopes that it continues to last.  Still not having any pain radiating into her lower extremities.  Previous treatments: aleve, tylenol, activity modification, lumbar injection  Physical Exam:    General: no acute distress, appears stated age Neurologic: alert, answering questions appropriately, following commands Respiratory: unlabored breathing on room air, symmetric chest rise Psychiatric: appropriate affect, normal cadence to speech     MSK (spine):   -Strength exam                                                   Left                  Right EHL                              5/5                  5/5 TA                                 5/5                   5/5 GSC                             5/5                  5/5 Knee extension            5/5                  5/5 Hip flexion                    5/5                  5/5   -Sensory exam  Sensation intact to light touch in L3-S1 nerve distributions of bilateral lower extremities   -Achilles DTR: 2/4 on the left, 2/4 on the right -Patellar tendon DTR: 2/4 on the left, 2/4 on the right   -Straight leg raise: negative bilaterally -Femoral nerve stretch test: negative bilaterally -Clonus: no beats bilaterally   -Left hip exam: No pain through range of motion, negative Stinchfield, negative FABER -Right hip exam: No pain through range of motion, negative Stinchfield, negative FABER  Imaging: XR of the lumbar spine from 04/20/2022 was independently reviewed and interpreted, showing disc height loss with anterior osteophyte formation at L3/4.  L1 compression fracture with cement augmentation into the vertebral body.  Focal kyphosis above L1 with caudal migration of the T12 vertebrae into L1.  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   MRI of the lumbar spine from 03/07/2022 was independently reviewed and interpreted, showing L1 burst fracture with retropulsion.  No significant central, lateral recess, foraminal stenosis.   Patient name: Jillian Guerrero Patient MRN: [id number] Date of visit: 06/02/22

## 2022-06-12 ENCOUNTER — Other Ambulatory Visit: Payer: Self-pay | Admitting: Family Medicine

## 2022-06-12 DIAGNOSIS — G609 Hereditary and idiopathic neuropathy, unspecified: Secondary | ICD-10-CM

## 2022-06-23 ENCOUNTER — Other Ambulatory Visit: Payer: Self-pay | Admitting: Surgical

## 2022-06-24 ENCOUNTER — Ambulatory Visit
Admission: EM | Admit: 2022-06-24 | Discharge: 2022-06-24 | Disposition: A | Discharge status: Home / Self Care | Payer: Medicare Other | Attending: Internal Medicine | Admitting: Internal Medicine

## 2022-06-24 DIAGNOSIS — N3001 Acute cystitis with hematuria: Secondary | ICD-10-CM | POA: Diagnosis present

## 2022-06-24 LAB — POCT URINALYSIS DIP (MANUAL ENTRY)
Bilirubin, UA: NEGATIVE
Glucose, UA: 100 mg/dL — AB
Ketones, POC UA: NEGATIVE mg/dL
Leukocytes, UA: NEGATIVE
Nitrite, UA: POSITIVE — AB
Spec Grav, UA: >=1.03 — AB (ref 1.010–1.025)
Urobilinogen, UA: 1 E.U./dL
pH, UA: 5 (ref 5.0–8.0)

## 2022-06-24 MED ORDER — CIPROFLOXACIN HCL 250 MG PO TABS: 250.0000 mg | ORAL_TABLET | Freq: Two times a day (BID) | ORAL | 0 refills | Status: AC

## 2022-06-24 NOTE — Discharge Instructions (Signed)
The clinic will contact you with results of the urine culture done today if positive You can start Cipro twice daily for 5 days.  Do not take your tizanidine while you are on Cipro as this can cause dizziness and your blood pressure to go down. Increase your fluids Follow-up with your PCP if your symptoms do not improve Please go to the emergency room for any worsening symptoms

## 2022-06-24 NOTE — ED Triage Notes (Signed)
Pt states hx of UTI. States having burning of urination with urgency and frequency since yesterday. States having incontinence of bowel x5 days

## 2022-06-24 NOTE — ED Provider Notes (Signed)
[facility]    CSN: [id number] Arrival date & time: 06/24/22  1110      History   Chief Complaint Chief Complaint  Patient presents with   Urinary Tract Infection    HPI Jillian Guerrero is a 77 y.o. female presents for evaluation of dysuria.  Patient reports yesterday she began having urinary burning, urgency, frequency.  Denies hematuria, fevers, nausea/vomiting, flank pain.  No vaginal discharge or STD concern.  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  She is attempting to establish with GI since moving to [place] for further workup of this.  She did take Azo OTC for her symptoms.  No other concerns at this time.   Urinary Tract Infection   Past Medical History:  Diagnosis Date   GERD (gastroesophageal reflux disease)    Hyperlipidemia    Lumbar compression fracture    Osteoporosis    Peripheral neuropathy     Patient Active Problem List   Diagnosis Date Noted   Herpes zoster 11/10/2021   Peripheral neuropathy 11/10/2021   Chronic rhinitis 01/02/2021   Chronic cough 11/27/2020   Hyperlipidemia 01/20/2020   Asthma 01/20/2020   GERD (gastroesophageal reflux disease) 01/20/2020   Osteoporosis 01/20/2020   Vitamin D deficiency 01/20/2020   Macular degeneration of both eyes 01/20/2020    Past Surgical History:  Procedure Laterality Date   APPENDECTOMY  1956   BLADDER SUSPENSION  2003   BREAST BIOPSY  2015   hyaluronic acid viscosupplementation Bilateral    knees - 01/16/2018 x2   IR RADIOLOGIST EVAL & MGMT  01/21/2021   KYPHOPLASTY  10/2018   TONSILLECTOMY  1964   UPPER GI ENDOSCOPY  03/08/2019   gastritis    OB History   No obstetric history on file.      Home Medications    Prior to Admission medications   Medication Sig Start Date End Date Taking? Authorizing Provider  ciprofloxacin (CIPRO) 250 MG tablet Take 1 tablet (250 mg total) by mouth 2 (two) times daily for 5 days. 06/24/22 06/29/22 Yes Radford Pax, NP  alendronate (FOSAMAX) 70 MG tablet Take 1 tablet (70 mg total) by mouth every 7 (seven) days. Take with a full glass of water on an empty stomach. 11/10/21   Karie Georges, MD  atorvastatin (LIPITOR) 10 MG tablet Take 1 tablet (10 mg total) by mouth daily. 11/10/21   Karie Georges, MD  calcium citrate-vitamin D (CITRACAL+D) 315-200 MG-UNIT tablet Take 1 tablet by mouth 2 (two) times daily.    [provider]  cetirizine (ZYRTEC) 10 MG tablet Take 10 mg by mouth daily.    [provider]  gabapentin (NEURONTIN) 600 MG tablet Take 1 tablet (600 mg total) by mouth 2 (two) times daily. 11/10/21   Karie Georges, MD  naproxen (NAPROSYN) 500 MG tablet TAKE ONE TABLET BY MOUTH TWICE DAILY FOR TEN DAYS AND THEN ONE TIME DAILY 06/23/22   Magnant, Joycie Peek, PA-C  pantoprazole (PROTONIX) 40 MG tablet Take 1 tablet (40 mg total) by mouth daily. 11/10/21   Karie Georges, MD  tiZANidine (ZANAFLEX) 4 MG tablet TAKE 1 TABLET AT BEDTIME 06/14/22   Karie Georges, MD  valACYclovir (VALTREX) 1000 MG tablet Take 1 tablet (1,000 mg total) by mouth daily. 11/10/21   Karie Georges, MD    Family History Family History  Problem Relation Age of Onset   Arthritis Mother    Heart disease Mother  Arthritis Father    Depression Father    Arthritis Brother    High blood pressure Brother    High Cholesterol Brother    Arthritis Maternal Grandfather    Asthma Paternal Grandmother    Depression Paternal Grandmother    Heart disease Paternal Grandmother    High Cholesterol Paternal Grandmother    Arthritis Son    Heart attack Son    High Cholesterol Son    Arthritis Daughter     Social History Social History   Tobacco Use   Smoking status: Former    Packs/day: 1.00    Years: 40.00    Additional pack years: 0.00    Total pack years: 40.00    Types: Cigarettes   Smokeless tobacco: Never  Vaping Use   Vaping Use: Never used  Substance Use Topics    Alcohol use: Never   Drug use: Never     Allergies   Collagen   Review of Systems Review of Systems  Genitourinary:  Positive for dysuria.     Physical Exam Triage Vital Signs ED Triage Vitals  Enc Vitals Group     BP 06/24/22 1131 137/77     Pulse Rate 06/24/22 1131 78     Resp 06/24/22 1131 18     Temp 06/24/22 1131 97.9 F (36.6 C)     Temp Source 06/24/22 1131 Oral     SpO2 06/24/22 1131 97 %     Weight --      Height --      Head Circumference --      Peak Flow --      Pain Score 06/24/22 1132 0     Pain Loc --      Pain Edu? --      Excl. in GC? --    No data found.  Updated Vital Signs BP 137/77 (BP Location: Left Arm)   Pulse 78   Temp 97.9 F (36.6 C) (Oral)   Resp 18   SpO2 97%   Visual Acuity Right Eye Distance:   Left Eye Distance:   Bilateral Distance:    Right Eye Near:   Left Eye Near:    Bilateral Near:     Physical Exam Vitals and nursing note reviewed.  Constitutional:      Appearance: Normal appearance.  HENT:     Head: Normocephalic and atraumatic.  Eyes:     Pupils: Pupils are equal, round, and reactive to light.  Cardiovascular:     Rate and Rhythm: Normal rate.  Pulmonary:     Effort: Pulmonary effort is normal.  Abdominal:     Palpations: Abdomen is soft.     Tenderness: There is no right CVA tenderness or left CVA tenderness.  Skin:    General: Skin is warm and dry.  Neurological:     General: No focal deficit present.     Mental Status: She is alert and oriented to person, place, and time.  Psychiatric:        Mood and Affect: Mood normal.        Behavior: Behavior normal.      UC Treatments / Results  Labs (all labs ordered are listed, but only abnormal results are displayed) Labs Reviewed  POCT URINALYSIS DIP (MANUAL ENTRY) - Abnormal; Notable for the following components:      Result Value   Color, UA orange (*)    Glucose, UA =100 (*)    Spec Grav, UA >=1.030 (*)  Blood, UA small (*)    Protein  Ur, POC trace (*)    Nitrite, UA Positive (*)    All other components within normal limits  URINE CULTURE   Recent Results (from the past 2160 hour(s))  Lipid Panel     Status: None   Collection Time: 05/04/22  1:34 PM  Result Value Ref Range   Cholesterol 179 0 - 200 mg/dL    Comment: ATP III Classification       Desirable:  < 200 mg/dL               Borderline High:  200 - 239 mg/dL          High:  > = 161 mg/dL   Triglycerides 096.0 0.0 - 149.0 mg/dL    Comment: Normal:  <454 mg/dLBorderline High:  150 - 199 mg/dL   HDL 09.81 >19.14 mg/dL   VLDL 78.2 0.0 - 95.6 mg/dL   LDL Cholesterol 87 0 - 99 mg/dL   Total CHOL/HDL Ratio 3     Comment:                Men          Women1/2 Average Risk     3.4          3.3Average Risk          5.0          4.42X Average Risk          9.6          7.13X Average Risk          15.0          11.0                       NonHDL 112.95     Comment: NOTE:  Non-HDL goal should be 30 mg/dL higher than patient's LDL goal (i.e. LDL goal of < 70 mg/dL, would have non-HDL goal of < 100 mg/dL)  CMP     Status: Abnormal   Collection Time: 05/04/22  1:34 PM  Result Value Ref Range   Sodium 141 135 - 145 mEq/L   Potassium 4.0 3.5 - 5.1 mEq/L   Chloride 104 96 - 112 mEq/L   CO2 27 19 - 32 mEq/L   Glucose, Bld 125 (H) 70 - 99 mg/dL   BUN 14 6 - 23 mg/dL   Creatinine, Ser 2.13 0.40 - 1.20 mg/dL   Total Bilirubin 0.4 0.2 - 1.2 mg/dL   Alkaline Phosphatase 40 39 - 117 U/L   AST 14 0 - 37 U/L   ALT 15 0 - 35 U/L   Total Protein 6.9 6.0 - 8.3 g/dL   Albumin 4.2 3.5 - 5.2 g/dL   GFR 08.65 >78.46 mL/min    Comment: Calculated using the CKD-EPI Creatinine Equation (2021)   Calcium 9.8 8.4 - 10.5 mg/dL  Urinalysis with Reflex Microscopic     Status: Abnormal   Collection Time: 05/04/22  1:34 PM  Result Value Ref Range   Color, Urine YELLOW Yellow;Lt. Yellow;Straw;Dark Yellow;Amber;Green;Red;Brown   APPearance CLEAR Clear;Turbid;Slightly Cloudy;Cloudy   Specific  Gravity, Urine 1.025 1.000 - 1.030   pH 6.0 5.0 - 8.0   Total Protein, Urine NEGATIVE Negative   Urine Glucose NEGATIVE Negative   Ketones, ur NEGATIVE Negative   Bilirubin Urine NEGATIVE Negative   Hgb urine dipstick TRACE-INTACT (A) Negative   Urobilinogen, UA 0.2 0.0 - 1.0  Leukocytes,Ua NEGATIVE Negative   Nitrite NEGATIVE Negative   WBC, UA 0-2/hpf 0-2/hpf   RBC / HPF 0-2/hpf 0-2/hpf   Squamous Epithelial / HPF Rare(0-4/hpf) Rare(0-4/hpf)  TSH     Status: None   Collection Time: 05/04/22  1:34 PM  Result Value Ref Range   TSH 0.68 0.35 - 5.50 uIU/mL  CBC with Differential/Platelets     Status: Abnormal   Collection Time: 05/04/22  1:34 PM  Result Value Ref Range   WBC 12.0 (H) 4.0 - 10.5 K/uL   RBC 4.56 3.87 - 5.11 Mil/uL   Hemoglobin 14.3 12.0 - 15.0 g/dL   HCT 16.1 09.6 - 04.5 %   MCV 91.9 78.0 - 100.0 fl   MCHC 34.0 30.0 - 36.0 g/dL   RDW 40.9 81.1 - 91.4 %   Platelets 238.0 150.0 - 400.0 K/uL   Neutrophils Relative % 87.2 (H) 43.0 - 77.0 %    Comment: Rechecked and verified result.   Lymphocytes Relative 7.5 (L) 12.0 - 46.0 %   Monocytes Relative 4.9 3.0 - 12.0 %   Eosinophils Relative 0.1 0.0 - 5.0 %   Basophils Relative 0.3 0.0 - 3.0 %   Neutro Abs 10.4 (H) 1.4 - 7.7 K/uL   Lymphs Abs 0.9 0.7 - 4.0 K/uL   Monocytes Absolute 0.6 0.1 - 1.0 K/uL   Eosinophils Absolute 0.0 0.0 - 0.7 K/uL   Basophils Absolute 0.0 0.0 - 0.1 K/uL  Urinalysis     Status: Abnormal   Collection Time: 05/19/22  9:38 AM  Result Value Ref Range   Color, Urine YELLOW Yellow;Lt. Yellow;Straw;Dark Yellow;Amber;Green;Red;Brown   APPearance CLEAR Clear;Turbid;Slightly Cloudy;Cloudy   Specific Gravity, Urine >=1.030 (A) 1.000 - 1.030   pH 5.5 5.0 - 8.0   Total Protein, Urine NEGATIVE Negative   Urine Glucose NEGATIVE Negative   Ketones, ur NEGATIVE Negative   Bilirubin Urine NEGATIVE Negative   Hgb urine dipstick NEGATIVE Negative   Urobilinogen, UA 0.2 0.0 - 1.0   Leukocytes,Ua NEGATIVE  Negative   Nitrite NEGATIVE Negative  POCT urinalysis dipstick     Status: Abnormal   Collection Time: 06/24/22 11:40 AM  Result Value Ref Range   Color, UA orange (A) yellow   Clarity, UA clear clear   Glucose, UA =100 (A) negative mg/dL   Bilirubin, UA negative negative   Ketones, POC UA negative negative mg/dL   Spec Grav, UA >=7.829 (A) 1.010 - 1.025   Blood, UA small (A) negative   pH, UA 5.0 5.0 - 8.0   Protein Ur, POC trace (A) negative mg/dL   Urobilinogen, UA 1.0 0.2 or 1.0 E.U./dL   Nitrite, UA Positive (A) Negative   Leukocytes, UA Negative Negative     EKG   Radiology No results found.  Procedures Procedures (including critical care time)  Medications Ordered in UC Medications - No data to display  Initial Impression / Assessment and Plan / UC Course  I have reviewed the triage vital signs and the nursing notes.  Pertinent labs & imaging results that were available during my care of the patient were reviewed by me and considered in my medical decision making (see chart for details).     Reviewed exam and symptoms with patient.  I did review recent labs and provider encounters. patient did take Azo.  Will culture urine and start antibiotics based on symptoms.  Patient states Cipro is the only antibiotic that works for her UTIs.  I explained side effect profile Cipro in length  with patient and she verbalized understanding and still wishes to proceed with this.  Cipro 250 mg twice daily for 5 days sent to pharmacy Encouraged fluids PCP follow-up if symptoms do not improve ER precautions reviewed and patient verbalized understanding Final Clinical Impressions(s) / UC Diagnoses   Final diagnoses:  Acute cystitis with hematuria     Discharge Instructions      The clinic will contact you with results of the urine culture done today if positive You can start Cipro twice daily for 5 days.  Do not take your tizanidine while you are on Cipro as this can cause  dizziness and your blood pressure to go down. Increase your fluids Follow-up with your PCP if your symptoms do not improve Please go to the emergency room for any worsening symptoms    ED Prescriptions     Medication Sig Dispense Auth. Provider   ciprofloxacin (CIPRO) 250 MG tablet Take 1 tablet (250 mg total) by mouth 2 (two) times daily for 5 days. 10 tablet Radford Pax, NP      PDMP not reviewed this encounter.   Radford Pax, NP 06/24/22 [phone]

## 2022-06-25 ENCOUNTER — Ambulatory Visit (INDEPENDENT_AMBULATORY_CARE_PROVIDER_SITE_OTHER): Payer: Medicare Other | Admitting: Surgical

## 2022-06-25 ENCOUNTER — Encounter: Payer: Self-pay | Admitting: Surgical

## 2022-06-25 ENCOUNTER — Encounter: Payer: Self-pay | Admitting: Physician Assistant

## 2022-06-25 DIAGNOSIS — S83271A Complex tear of lateral meniscus, current injury, right knee, initial encounter: Secondary | ICD-10-CM

## 2022-06-25 MED ORDER — METHYLPREDNISOLONE ACETATE 40 MG/ML IJ SUSP
40.0000 mg | INTRAMUSCULAR | Status: AC | PRN
  Administered 2022-06-25: 40 mg via INTRA_ARTICULAR

## 2022-06-25 MED ORDER — LIDOCAINE HCL 1 % IJ SOLN
5.0000 mL | INTRAMUSCULAR | Status: AC | PRN
  Administered 2022-06-25: 5 mL

## 2022-06-25 MED ORDER — BUPIVACAINE HCL 0.25 % IJ SOLN
4.0000 mL | INTRAMUSCULAR | Status: AC | PRN
  Administered 2022-06-25: 4 mL via INTRA_ARTICULAR

## 2022-06-25 NOTE — Progress Notes (Signed)
Office Visit Note   Patient: Jillian Guerrero           Date of Birth: 1946-01-07           MRN: [id number] Visit Date: 06/25/2022 Requested by: Karie Georges, MD [address] [place],  [place] [address] PCP: Karie Georges, MD  Subjective: Chief Complaint  Patient presents with   Right Knee - Pain    HPI: Shrika Shamika Guerrero is a 77 y.o. female who presents to the office reporting right knee pain.  Patient returns with complaint of right knee pain that has returned.  She had cortisone injection by Dr. August Saucer on 12/16/2021 with excellent relief that lasted up until several weeks ago.  She now notes return of lateral sided knee pain.  No recent injury.  No groin pain or radicular pain.  Her back is doing very well these days after an injection by Dr. Alvester Morin several months ago.  She is very pleased with how she is feeling overall once she gets this knee pain sorted out.  She notes it is a little difficult to go from a sitting position to a standing position because of her knee pain.  If she sits for too long her knee feels stiff..                ROS: All systems reviewed are negative as they relate to the chief complaint within the history of present illness.  Patient denies fevers or chills.  Assessment & Plan: Visit Diagnoses:  1. Complex tear of lateral meniscus of right knee as current injury, initial encounter     Plan: Patient is a 77 year old female who presents for evaluation of right knee pain.  She has history of right knee pain that was improved with cortisone injection by Dr. August Saucer on 12/16/2021.  She has MRI of the right knee from 12/06/2021 that demonstrates complex tearing of the lateral meniscus with mild to moderate thinning of the lateral patellar facet.  She would like to repeat injection today given the sustained relief she had from the last 1.  Injection administered and patient tolerated procedure well without complication.  Plan for her to follow-up with the  office as needed.  Follow-Up Instructions: No follow-ups on file.   Orders:  No orders of the defined types were placed in this encounter.  No orders of the defined types were placed in this encounter.     Procedures: Large Joint Inj: R knee on 06/25/2022 1:31 PM Indications: diagnostic evaluation, joint swelling and pain Details: 18 G 1.5 in needle, superolateral approach  Arthrogram: No  Medications: 5 mL lidocaine 1 %; 40 mg methylPREDNISolone acetate 40 MG/ML; 4 mL bupivacaine 0.25 % Outcome: tolerated well, no immediate complications Procedure, treatment alternatives, risks and benefits explained, specific risks discussed. Consent was given by the patient. Immediately prior to procedure a time out was called to verify the correct patient, procedure, equipment, support staff and site/side marked as required. Patient was prepped and draped in the usual sterile fashion.       Clinical Data: No additional findings.  Objective: Vital Signs: There were no vitals taken for this visit.  Physical Exam:  Constitutional: Patient appears well-developed HEENT:  Head: Normocephalic Eyes:EOM are normal Neck: Normal range of motion Cardiovascular: Normal rate Pulmonary/chest: Effort normal Neurologic: Patient is alert Skin: Skin is warm Psychiatric: Patient has normal mood and affect  Ortho Exam: Ortho exam demonstrates right knee with trace effusion.  Tenderness over  the lateral joint line moderately and medial joint line mildly.  Pain with patellar side-to-side motion, especially laterally.  She has good quad strength and is able to perform straight leg raise without extensor lag.  No calf tenderness.  Negative Homans' sign.  No pain with hip range of motion.  No cellulitis or skin changes noted.  Specialty Comments:  MRI LUMBAR SPINE WITHOUT CONTRAST   TECHNIQUE: Multiplanar, multisequence MR imaging of the lumbar spine was performed. No intravenous contrast was  administered.   COMPARISON:  10/20/2020   FINDINGS: Segmentation:  Standard.   Alignment:  Physiologic.   Vertebrae: No acute fracture, evidence of discitis, or aggressive bone lesion. L1 vertebral body compression fracture unchanged compared with 10/20/2020 with 50% height loss and mild marrow edema along the superior posterior corner of the L1 vertebral body. 4 mm retropulsion of the superior posterior margin of the L1 vertebral body mildly impressing upon the thecal sac and resulting in mild spinal stenosis. Benign hemangioma in the L4 vertebral body.   Conus medullaris and cauda equina: Conus extends to the L1 level. Conus and cauda equina appear normal.   Paraspinal and other soft tissues: No acute paraspinal abnormality.   Disc levels:   Disc spaces: Disc desiccation throughout the lumbar spine. Mild disc height loss at L3-4.   T11-12: Mild broad-based disc bulge. Moderate right and mild left facet arthropathy. No foraminal or central canal stenosis.   T12-L1: Broad-based disc bulge. Mild bilateral facet arthropathy. No foraminal or central canal stenosis.   L1-L2: Mild broad-based disc bulge. Mild bilateral facet arthropathy. Mild bilateral foraminal stenosis.   L2-L3: Mild broad-based disc bulge. Mild bilateral facet arthropathy. No foraminal or central canal stenosis.   L3-L4: Mild broad-based disc bulge. Moderate bilateral facet arthropathy. Mild spinal stenosis. Bilateral lateral recess stenosis. Mild bilateral foraminal stenosis.   L4-L5: Mild broad-based disc bulge. Mild bilateral facet arthropathy. No foraminal or central canal stenosis.   L5-S1: Mild broad-based disc bulge. Moderate bilateral facet arthropathy. No foraminal or central canal stenosis.   IMPRESSION: 1. L1 vertebral body compression fracture unchanged compared with 10/20/2020 with 50% height loss and mild residual marrow edema along the superior posterior corner of the L1 vertebral  body. 4 mm retropulsion of the superior posterior margin of the L1 vertebral body mildly impressing upon the thecal sac and resulting in mild spinal stenosis. 2. Lumbar spine spondylosis as described above.     Electronically Signed   By: Elige Ko M.D.   On: 03/08/2022 15:34  Imaging: No results found.   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   Past Medical History:  Diagnosis Date   GERD (gastroesophageal reflux disease)    Hyperlipidemia    Lumbar compression fracture (HCC)    Osteoporosis    Peripheral neuropathy     Family History  Problem Relation Age of Onset   Arthritis Mother    Heart disease Mother    Arthritis Father    Depression Father    Arthritis Brother    High blood pressure Brother    High Cholesterol Brother    Arthritis Maternal Grandfather    Asthma Paternal Grandmother  Depression Paternal Grandmother    Heart disease Paternal Grandmother    High Cholesterol Paternal Grandmother    Arthritis Son    Heart attack Son    High Cholesterol Son    Arthritis Daughter     Past Surgical History:  Procedure Laterality Date   APPENDECTOMY  1956   BLADDER SUSPENSION  2003   BREAST BIOPSY  2015   hyaluronic acid viscosupplementation Bilateral    knees - 01/16/2018 x2   IR RADIOLOGIST EVAL & MGMT  01/21/2021   KYPHOPLASTY  10/2018   TONSILLECTOMY  1964   UPPER GI ENDOSCOPY  03/08/2019   gastritis   Social History   Occupational History   Not on file  Tobacco Use   Smoking status: Former    Packs/day: 1.00    Years: 40.00    Additional pack years: 0.00    Total pack years: 40.00     Types: Cigarettes   Smokeless tobacco: Never  Vaping Use   Vaping Use: Never used  Substance and Sexual Activity   Alcohol use: Never   Drug use: Never   Sexual activity: Not on file

## 2022-06-26 ENCOUNTER — Ambulatory Visit
Admission: EM | Admit: 2022-06-26 | Discharge: 2022-06-26 | Disposition: A | Discharge status: Home / Self Care | Payer: Medicare Other | Attending: Urgent Care | Admitting: Urgent Care

## 2022-06-26 DIAGNOSIS — R220 Localized swelling, mass and lump, head: Secondary | ICD-10-CM | POA: Diagnosis not present

## 2022-06-26 DIAGNOSIS — T50905A Adverse effect of unspecified drugs, medicaments and biological substances, initial encounter: Secondary | ICD-10-CM

## 2022-06-26 DIAGNOSIS — N3001 Acute cystitis with hematuria: Secondary | ICD-10-CM | POA: Diagnosis not present

## 2022-06-26 MED ORDER — PREDNISONE 20 MG PO TABS: ORAL_TABLET | ORAL | 0 refills | Status: DC

## 2022-06-26 MED ORDER — PHENAZOPYRIDINE HCL 200 MG PO TABS: 200.0000 mg | ORAL_TABLET | Freq: Three times a day (TID) | ORAL | 0 refills | Status: DC | PRN

## 2022-06-26 MED ORDER — HYDROXYZINE HCL 25 MG PO TABS: 12.5000 mg | ORAL_TABLET | Freq: Three times a day (TID) | ORAL | 0 refills | Status: DC | PRN

## 2022-06-26 NOTE — ED Triage Notes (Signed)
Pt reports redness, swelling in face, neck pain, headache started today; swelling eyes x 1 day. Pt reports the only new cortisone injection in knee.  Denies trouble breathing, talking, swallowing.

## 2022-06-26 NOTE — ED Provider Notes (Signed)
[facility]  Note:  This document was prepared using Conservation officer, historic buildings and may include unintentional dictation errors.  MRN: [id number] DOB: May 18, 1945  Subjective:   Jillian Guerrero is a 77 y.o. female presenting for acute onset since this morning of facial swelling, redness, itching, headache and neck pain, swelling of the eyelids.  She has been taking Cipro for empiric treatment of and urinary tract infection.  Was seen 06/24/2022.  No difficulty with her breathing.  Patient comes in 5 minutes before clinic is closing, reports that nothing progressed throughout the day since the morning regarding her possible allergic reaction. Continues to have urinary symptoms.    Current Facility-Administered Medications:    albuterol (PROVENTIL) (2.5 MG/3ML) 0.083% nebulizer solution 2.5 mg, 2.5 mg, Nebulization, Once, Parrett, Tammy S, NP  Current Outpatient Medications:    alendronate (FOSAMAX) 70 MG tablet, Take 1 tablet (70 mg total) by mouth every 7 (seven) days. Take with a full glass of water on an empty stomach., Disp: 12 tablet, Rfl: 3   atorvastatin (LIPITOR) 10 MG tablet, Take 1 tablet (10 mg total) by mouth daily., Disp: 90 tablet, Rfl: 3   calcium citrate-vitamin D (CITRACAL+D) 315-200 MG-UNIT tablet, Take 1 tablet by mouth 2 (two) times daily., Disp: , Rfl:    cetirizine (ZYRTEC) 10 MG tablet, Take 10 mg by mouth daily., Disp: , Rfl:    ciprofloxacin (CIPRO) 250 MG tablet, Take 1 tablet (250 mg total) by mouth 2 (two) times daily for 5 days., Disp: 10 tablet, Rfl: 0   gabapentin (NEURONTIN) 600 MG tablet, Take 1 tablet (600 mg total) by mouth 2 (two) times daily., Disp: 180 tablet, Rfl: 1   naproxen (NAPROSYN) 500 MG tablet, TAKE ONE TABLET BY MOUTH TWICE DAILY FOR TEN DAYS AND THEN ONE TIME DAILY, Disp: 30 tablet, Rfl: 0   pantoprazole (PROTONIX) 40 MG tablet, Take 1 tablet (40 mg total) by mouth daily., Disp: 90 tablet, Rfl: 3   tiZANidine  (ZANAFLEX) 4 MG tablet, TAKE 1 TABLET AT BEDTIME, Disp: 90 tablet, Rfl: 3   valACYclovir (VALTREX) 1000 MG tablet, Take 1 tablet (1,000 mg total) by mouth daily., Disp: 90 tablet, Rfl: 3   Allergies  Allergen Reactions   Collagen     Redness, swelling    Past Medical History:  Diagnosis Date   GERD (gastroesophageal reflux disease)    Hyperlipidemia    Lumbar compression fracture (HCC)    Osteoporosis    Peripheral neuropathy      Past Surgical History:  Procedure Laterality Date   APPENDECTOMY  1956   BLADDER SUSPENSION  2003   BREAST BIOPSY  2015   hyaluronic acid viscosupplementation Bilateral    knees - 01/16/2018 x2   IR RADIOLOGIST EVAL & MGMT  01/21/2021   KYPHOPLASTY  10/2018   TONSILLECTOMY  1964   UPPER GI ENDOSCOPY  03/08/2019   gastritis    Family History  Problem Relation Age of Onset   Arthritis Mother    Heart disease Mother    Arthritis Father    Depression Father    Arthritis Brother    High blood pressure Brother    High Cholesterol Brother    Arthritis Maternal Grandfather    Asthma Paternal Grandmother    Depression Paternal Grandmother    Heart disease Paternal Grandmother    High Cholesterol Paternal Grandmother    Arthritis Son    Heart attack Son    High Cholesterol Son  Arthritis Daughter     Social History   Tobacco Use   Smoking status: Former    Packs/day: 1.00    Years: 40.00    Additional pack years: 0.00    Total pack years: 40.00    Types: Cigarettes   Smokeless tobacco: Never  Vaping Use   Vaping Use: Never used  Substance Use Topics   Alcohol use: Never   Drug use: Never    ROS   Objective:   Vitals: BP 127/87 (BP Location: Right Arm)   Pulse 79   Temp 98.2 F (36.8 C) (Oral)   Resp 18   SpO2 94%   Physical Exam Constitutional:      General: She is not in acute distress.    Appearance: Normal appearance. She is well-developed and normal weight. She is not ill-appearing, toxic-appearing or  diaphoretic.  HENT:     Head: Normocephalic and atraumatic.     Comments: Airway is patent. Patient controlling secretions, speaking in full sentences.     Right Ear: Tympanic membrane, ear canal and external ear normal. No drainage or tenderness. No middle ear effusion. There is no impacted cerumen. Tympanic membrane is not erythematous or bulging.     Left Ear: Tympanic membrane, ear canal and external ear normal. No drainage or tenderness.  No middle ear effusion. There is no impacted cerumen. Tympanic membrane is not erythematous or bulging.     Nose: Nose normal. No congestion or rhinorrhea.     Mouth/Throat:     Mouth: Mucous membranes are moist. No oral lesions.     Pharynx: No pharyngeal swelling, oropharyngeal exudate, posterior oropharyngeal erythema or uvula swelling.     Tonsils: No tonsillar exudate or tonsillar abscesses.  Eyes:     General: No scleral icterus.       Right eye: No discharge.        Left eye: No discharge.     Extraocular Movements: Extraocular movements intact.     Right eye: Normal extraocular motion.     Left eye: Normal extraocular motion.     Conjunctiva/sclera: Conjunctivae normal.     Comments: Trace swelling of the upper eyelids.   Cardiovascular:     Rate and Rhythm: Normal rate and regular rhythm.     Heart sounds: Normal heart sounds. No murmur heard.    No friction rub. No gallop.  Pulmonary:     Effort: Pulmonary effort is normal. No respiratory distress.     Breath sounds: No stridor. No wheezing, rhonchi or rales.     Comments: No respiratory distress.  Chest:     Chest wall: No tenderness.  Musculoskeletal:     Cervical back: Normal range of motion and neck supple.  Lymphadenopathy:     Cervical: No cervical adenopathy.  Skin:    General: Skin is warm and dry.     Findings: Rash (facial erythema) present.  Neurological:     General: No focal deficit present.     Mental Status: She is alert and oriented to person, place, and time.   Psychiatric:        Mood and Affect: Mood normal.        Behavior: Behavior normal.     Assessment and Plan :   PDMP not reviewed this encounter.  1. Medication reaction, initial encounter   2. Facial swelling   3. Acute cystitis with hematuria     Suspect reaction to ciprofloxacin. Added this to her list of allergies. Urine culture is  pending and will base further antibiotic use on that particular result especially in light of her medication reaction. Start prednisone and hydroxyzine. No suspicion for anaphylaxis as she has had this reaction since the morning and as patient comes in when our clinic is closing without progression of her reaction will defer ER visit. Counseled patient on potential for adverse effects with medications prescribed/recommended today, ER and return-to-clinic precautions discussed, patient verbalized understanding.    Wallis Bamberg, [place] 06/27/22 [phone]

## 2022-06-26 NOTE — Discharge Instructions (Addendum)
Stop taking ciprofloxacin. Take prednisone for the reaction, use hydroxyzine for itching. We will let you know what antibiotic course would be appropriate based off of the urine culture results.

## 2022-06-27 LAB — URINE CULTURE: Culture: 10000 — AB

## 2022-06-28 ENCOUNTER — Ambulatory Visit: Payer: Medicare Other | Admitting: Adult Health

## 2022-06-28 ENCOUNTER — Telehealth (HOSPITAL_COMMUNITY): Payer: Self-pay | Admitting: Emergency Medicine

## 2022-06-28 MED ORDER — NITROFURANTOIN MONOHYD MACRO 100 MG PO CAPS: 100.0000 mg | ORAL_CAPSULE | Freq: Two times a day (BID) | ORAL | 0 refills | Status: DC

## 2022-07-20 ENCOUNTER — Ambulatory Visit: Payer: Medicare Other | Admitting: Family Medicine

## 2022-08-04 NOTE — Progress Notes (Unsigned)
08/05/2022 Jillian Guerrero [id number] [birth year]  Referring provider: Karie Georges, MD Primary GI doctor: Dr. 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 Will evaluate with KUB to evaluate stool burden, patient may benefit from bowel purge Currently states she is doing well though, close follow-up 3 months  Gastroesophageal reflux disease without esophagitis Lifestyle changes discussed, avoid NSAIDS, ETOH Continue on the same medication, reports symptoms are well controlled.     Patient Care Team: Karie Georges, MD as PCP - General (Family Medicine) Glendale Chard, DO as Consulting Physician (Neurology)  HISTORY OF PRESENT ILLNESS: 77 y.o. female with a past medical history of hyperlipidemia, hypertension, lumbar compression fracture with lower back pain, recurrent cystitis, osteoporosis on Fosamax, chronic constipation, asymptomatic cholelithiasis and mouth fold, gastroparesis, history of gastric bezoars and others listed below presents for evaluation of bowel incontinence.   05/31/2013 colonoscopy at [facility] for change in bowel habits showed internal hemorrhoids, otherwise unremarkable.  I do not see where any biopsies were done, I do not see the quality of the prep listed. Patient brought in notes  from 10/04/2017 with [facility] History of gastroparesis I do not see a formal gastric emptying study, previously on domperidone and did well. 04/11/2007 EGD  multiple gastric bezoars  03/08/2019 EGD upper abdominal pain gastritis, do not see where the duodenum was evaluated, pathology showed reactive gastropathy negative H. pylori Negative celiac 2015. 03/04/2007 right upper quadrant [name] slight increase in fat liver asymptomatic gallstones Patient had immunity to hepatitis A but not hepatitis B 08/2017 labs reviewed show alk phos 42, AST 17, ALT 21, unremarkable kidney  She states her UGI symptoms are doing well.  She states her GERD is well controlled, denies nausea, vomiting, epigastric pain.  She will have coughing if she has GERD but this is controlled with pantoprazole once a day and allergy pill.   She states she always has constipation.  Has tried metamucil, miralax without help. She had one episode of no BM for 20 days, no AB pain with it.  She had to take dulcolax every day or every other day.  When she moved here 3 years ago she started to have BM every 3-4 days without taking anything.  She states a few months ago she was at the grocery store, she had moderate to small amount of formed mushy stools, she had no awareness of defecation. Would have daily.   She states she went to [place] a month ago for 10 days for graduations, she states she was not eating veggies while there and the incontinence resolved.  She has cut back on her veggies and this is much better.  She did do a trial of broccolli yesterday, had small mushy stools 4-5 times after eating that.  Patient is unable to control stool with defecation.   There is lack of awareness of the need to defecate. She  has urinary frequency at night, would have BM with urination.  She had two formed stools in the 10 days she was in [place], small volume, feeling of incomplete BM. Thin BM.  No AB pain, no bloating, no  weight loss. No urgency.  No new medications, not on medication for Bm's.  No history of prior hemorrhoid, fissure or anorectal surgery, pelvic irradiation, or neurologic disease. Patient had kyphoplasty after fractured vertebrae 2019 and surgery 2  She denies blood thinner use.  She reports NSAID use.  She denies ETOH use.   She denies tobacco use.  She denies drug use.    She  reports that she has quit smoking. Her smoking use included cigarettes. She has a 40.00 pack-year smoking history. She has never used smokeless tobacco. She reports that she does not drink alcohol and does not use drugs.  RELEVANT LABS AND IMAGING: CBC    Component Value Date/Time   WBC 12.0 (H) 05/04/2022 1334   RBC 4.56 05/04/2022 1334   HGB 14.3 05/04/2022 1334   HCT 42.0 05/04/2022 1334   PLT 238.0 05/04/2022 1334   MCV 91.9 05/04/2022 1334   MCHC 34.0 05/04/2022 1334   RDW 14.0 05/04/2022 1334   LYMPHSABS 0.9 05/04/2022 1334   MONOABS 0.6 05/04/2022 1334   EOSABS 0.0 05/04/2022 1334   BASOSABS 0.0 05/04/2022 1334   Recent Labs    05/04/22 1334  HGB 14.3    CMP     Component Value Date/Time   NA 141 05/04/2022 1334   NA 140 09/17/2019 0000   K 4.0 05/04/2022 1334   CL 104 05/04/2022 1334   CO2 27 05/04/2022 1334   GLUCOSE 125 (H) 05/04/2022 1334   BUN 14 05/04/2022 1334   BUN 23 (A) 09/17/2019 0000   CREATININE 0.67 05/04/2022 1334   CALCIUM 9.8 05/04/2022 1334   PROT 6.9 05/04/2022 1334   ALBUMIN 4.2 05/04/2022 1334   AST 14 05/04/2022 1334   ALT 15 05/04/2022 1334   ALKPHOS 40 05/04/2022 1334   BILITOT 0.4 05/04/2022 1334      Latest Ref Rng & Units 05/04/2022    1:34 PM 03/24/2020   11:39 AM 09/17/2019   12:00 AM  Hepatic Function  Total Protein 6.0 - 8.3 g/dL 6.9  6.3    Albumin 3.5 - 5.2 g/dL 4.2  4.3  4.1      AST 0 - 37 U/L 14  13    ALT 0 - 35 U/L 15  12    Alk Phosphatase 39 - 117 U/L 40  37    Total Bilirubin 0.2 - 1.2 mg/dL 0.4  0.7       This result is from an  external source.      Current Medications:   Current Outpatient Medications (Endocrine & Metabolic):    alendronate (FOSAMAX) 70 MG tablet, Take 1 tablet (70 mg total) by mouth every 7 (seven) days. Take with a full glass of water on an empty stomach.   Current Outpatient Medications (Cardiovascular):    atorvastatin (LIPITOR) 10 MG tablet, Take 1 tablet (10 mg total) by mouth daily.   Current Outpatient Medications (Respiratory):    cetirizine (ZYRTEC) 10 MG tablet, Take 10 mg by mouth daily.  Current Facility-Administered Medications (Respiratory):    albuterol (PROVENTIL) (2.5 MG/3ML) 0.083% nebulizer solution 2.5 mg  Current Outpatient Medications (Analgesics):    naproxen (NAPROSYN) 500 MG tablet, TAKE ONE TABLET BY MOUTH TWICE DAILY FOR TEN DAYS AND THEN ONE TIME  DAILY     Current Outpatient Medications (Other):    calcium citrate-vitamin D (CITRACAL+D) 315-200 MG-UNIT tablet, Take 1 tablet by mouth 2 (two) times daily.   gabapentin (NEURONTIN) 600 MG tablet, Take 1 tablet (600 mg total) by mouth 2 (two) times daily.   pantoprazole (PROTONIX) 40 MG tablet, Take 1 tablet (40 mg total) by mouth daily.   tiZANidine (ZANAFLEX) 4 MG tablet, TAKE 1 TABLET AT BEDTIME   valACYclovir (VALTREX) 1000 MG tablet, Take 1 tablet (1,000 mg total) by mouth daily.   Medical History:  Past Medical History:  Diagnosis Date   Asthma    GERD (gastroesophageal reflux disease)    Hyperlipidemia    Lumbar compression fracture (HCC)    Macular degeneration, wet (HCC)    both eyes   Osteopenia    Osteoporosis    Peripheral neuropathy    Allergies:  Allergies  Allergen Reactions   Ciprofloxacin Swelling   Collagen     Redness, swelling     Surgical History:  She  has a past surgical history that includes Bladder suspension (2003); Breast biopsy (2015); Appendectomy (1956); Tonsillectomy (1964); Kyphoplasty (10/2018); Upper gi endoscopy (03/08/2019); hyaluronic acid  viscosupplementation (Bilateral); and IR Radiologist Eval & Mgmt (01/21/2021). Family History:  Her family history includes Arthritis in her brother, daughter, father, maternal grandfather, mother, and son; Asthma in her paternal grandmother; Depression in her father and paternal grandmother; Heart attack in her son; Heart disease in her mother and paternal grandmother; High Cholesterol in her brother, paternal grandmother, and son; High blood pressure in her brother.  REVIEW OF SYSTEMS  : All other systems reviewed and negative except where noted in the History of Present Illness.  PHYSICAL EXAM: BP 130/80   Pulse 73   Ht 5\' 7"  (1.702 m)   Wt 201 lb (91.2 kg)   BMI 31.48 kg/m  General Appearance: Well nourished, in no apparent distress. Head:   Normocephalic and atraumatic. Eyes:  sclerae anicteric,conjunctive pink  Respiratory: Respiratory effort normal, BS equal bilaterally without rales, rhonchi, wheezing. Cardio: RRR with no MRGs. Peripheral pulses intact.  Abdomen: Soft,  Obese ,active bowel sounds. No tenderness . Without guarding and Without rebound. No masses. Rectal: declines Musculoskeletal: Full ROM, Antalgic gait. Without edema. Skin:  Dry and intact without significant lesions or rashes Neuro: Alert and  oriented x4;  No focal deficits. Psych:  Cooperative. Normal mood and affect.    Doree Albee, PA-C 10:53 AM

## 2022-08-05 ENCOUNTER — Ambulatory Visit (INDEPENDENT_AMBULATORY_CARE_PROVIDER_SITE_OTHER): Payer: Medicare Other | Admitting: Physician Assistant

## 2022-08-05 ENCOUNTER — Encounter: Payer: Self-pay | Admitting: Physician Assistant

## 2022-08-05 ENCOUNTER — Ambulatory Visit (INDEPENDENT_AMBULATORY_CARE_PROVIDER_SITE_OTHER)
Admission: RE | Admit: 2022-08-05 | Discharge: 2022-08-05 | Disposition: A | Discharge status: Home / Self Care | Payer: Medicare Other | Source: Ambulatory Visit | Attending: Physician Assistant | Admitting: Physician Assistant

## 2022-08-05 VITALS — BP 130/80 | HR 73 | Ht 67.0 in | Wt 201.0 lb

## 2022-08-05 DIAGNOSIS — K5904 Chronic idiopathic constipation: Secondary | ICD-10-CM | POA: Diagnosis not present

## 2022-08-05 DIAGNOSIS — K219 Gastro-esophageal reflux disease without esophagitis: Secondary | ICD-10-CM

## 2022-08-05 DIAGNOSIS — R159 Full incontinence of feces: Secondary | ICD-10-CM

## 2022-08-05 DIAGNOSIS — K3184 Gastroparesis: Secondary | ICD-10-CM | POA: Diagnosis not present

## 2022-08-05 NOTE — Patient Instructions (Addendum)
Your provider has requested that you have an abdominal x ray before leaving today. Please go to the basement floor to our Radiology department for the test.  We have scheduled you for a follow up with Quentin Mulling on Sept 20th, 2024 at 11:00am  Add on B12 over the counter  Depending on your Xray, we may adjust your medications.     FODMAP stands for fermentable oligo-, di-, mono-saccharides and polyols (1). These are the scientific terms used to classify groups of carbs that are notorious for triggering digestive symptoms like bloating, gas and stomach pain.  Concentrate on fructans and avoiding that.  Can do the list before it.      Recommendations for the patient: Avoid sugars and caffeine Keep a food and symptom diary to identify causitive factors Keep your bottom clean and dry without excessive wiping or using astringent cleaners Apply a barrier cream such as zinc oxide to the perianal skin Consider using incontinence pads to protect your skin and clothes from fecal soiling  Fecal Incontinence Fecal incontinence, also called accidental bowel leakage, is not being able to control your bowels. This condition happens because the nerves or muscles around the anus do not work the way they should. This affects their ability to hold stool (feces). What are the causes? This condition may be caused by: Damage to the muscles at the end of the rectum (sphincter). Damage to the nerves that control bowel movements. Diarrhea. Chronic constipation. Pelvic floor dysfunction. This means the muscles in the pelvis do not work well. Loss of bowel storage capacity. This occurs when the rectum can no longer stretch in size in order to store feces. Inflammatory bowel disease (IBD), such as Crohn's disease. Irritable bowel syndrome (IBS). What increases the risk? You are more likely to develop this condition if you: Were born with bowels or a pelvis that did not form correctly. Have had rectal  surgery. Have had radiation treatment for certain cancers. Have been pregnant, had a vaginal delivery, or had surgery that damaged the pelvic floor muscles. Had a complicated childbirth, spinal cord injury, or other trauma that caused nerve damage. Have a condition that can affect nerve function, such as diabetes, Parkinson's disease, or multiple sclerosis. Have a condition where the rectum drops down into the anus or vagina (prolapse). Are 45 years of age or older. What are the signs or symptoms? The main symptom of this condition is not being able to control your bowels. You also might not be able to get to the bathroom before a bowel movement. How is this diagnosed? This condition is diagnosed with a medical history and physical exam. You may also have other tests, including: Blood tests. Urine tests. A rectal exam. Ultrasound. MRI. Colonoscopy. This is an exam that looks at your large intestine (colon). Anal manometry. This is a test that measures the strength of the anal sphincter. Anal electromyogram (EMG). This is a test that uses small electrodes to check for nerve damage. How is this treated? Treatment for this condition depends on the cause and severity. Treatment may also focus on addressing any underlying causes of this condition. Treatment may include: Medicines. This may include medicines to: Prevent diarrhea. Help with constipation (bulk-forming laxatives). Treat any underlying conditions. Biofeedback therapy. This can help to retrain muscles that are affected. Fiber supplements. These can help manage your bowel movements. Nerve stimulation. Injectable gel to promote tissue growth and better muscle control. Surgery. You may need: Sphincter repair surgery. Diversion surgery. This procedure  lets feces pass out of your body through a hole in your abdomen. Follow these instructions at home: Eating and drinking  Follow instructions from your health care provider about any  eating or drinking restrictions. Work with a dietitian to come up with a healthy diet that will help you avoid the foods that can make your condition worse. Keep a diet diary to find out which foods or drinks could be making your condition worse. Drink enough fluid to keep your urine pale yellow. Lifestyle Do not use any products that contain nicotine or tobacco, such as cigarettes and e-cigarettes. If you need help quitting, ask your health care provider. This may help your condition. If you are overweight, talk with your health care provider about how to safely lose weight. This may help your condition. Increase your physical activity as told by your health care provider. This may help your condition. Always talk with your health care provider before starting a new exercise program. Carry a change of clothes and supplies to clean up quickly if you have an episode of fetal incontinence. Consider joining a fecal incontinence support group. You can find a support group online or in your local community. General instructions  Take over-the-counter and prescription medicines only as told by your health care provider. This includes any supplements. Apply a moisture barrier, such as petroleum jelly, to your rectum. This protects the skin from irritation caused by ongoing leaking or diarrhea. Tell your health care provider if you are upset or depressed about your condition. Keep all follow-up visits as told by your health care provider. This is important. Where to find more information [employer] for Functional Gastrointestinal Disorders: iffgd.org [employer]: patients.gi.org Contact a health care provider if: You have a fever. You have redness, swelling, or pain around your rectum. Your pain is getting worse or you lose feeling in your rectal area. You have blood in your stool. You feel sad or hopeless. You avoid social or work situations. Get help right  away if: You stop having bowel movements. You cannot eat or drink without vomiting. You have rectal bleeding that does not stop. You have severe pain that is getting worse. You have symptoms of dehydration, including: Sleepiness or fatigue. Producing little or no urine, tears, or sweat. Dizziness. Dry mouth. Unusual irritability. Headache. Inability to think clearly. Summary Fecal incontinence, also called accidental bowel leakage, is not being able to control your bowels. This condition happens because the nerves or muscles around the anus do not work the way they should. Treatment varies depending on the cause and severity of your condition. Treatment may also focus on addressing any underlying causes of this condition. Follow instructions from your health care provider about any eating or drinking restrictions, lifestyle changes, and skin care. Take over-the-counter and prescription medicines only as told by your health care provider. This includes any supplements. Tell your health care provider if your symptoms worsen or if you are upset or depressed about your condition. This information is not intended to replace advice given to you by your health care provider. Make sure you discuss any questions you have with your health care provider. Document Revised: 06/30/2017 Document Reviewed: 06/30/2017 Elsevier Patient Education  2022 Elsevier Inc.   _______________________________________________________  If your blood pressure at your visit was 140/90 or greater, please contact your primary care physician to follow up on this.  _______________________________________________________  If you are age 53 or older, your body mass index should be between 23-30.  Your Body mass index is 31.48 kg/m. If this is out of the aforementioned range listed, please consider follow up with your Primary Care Provider.  If you are age 79 or younger, your body mass index should be between 19-25. Your  Body mass index is 31.48 kg/m. If this is out of the aformentioned range listed, please consider follow up with your Primary Care Provider.   ________________________________________________________  The [facility] providers would like to encourage you to use [facility] to communicate with providers for non-urgent requests or questions.  Due to long hold times on the telephone, sending your provider a message by [facility] may be a faster and more efficient way to get a response.  Please allow 48 business hours for a response.  Please remember that this is for non-urgent requests.   Gastroparesis Please do small frequent meals like 4-6 meals a day.  Eat and drink liquids at separate times.  Avoid high fiber foods, cook your vegetables, avoid high fat food.  Suggest spreading protein throughout the day (greek yogurt, glucerna, soft meat, milk, eggs) Choose soft foods that you can mash with a fork When you are more symptomatic, change to pureed foods foods and liquids.  Consider reading "Living well with Gastroparesis" by Reuel Derby Gastroparesis is a condition in which food takes longer than normal to empty from the stomach. This condition is also known as delayed gastric emptying. It is usually a long-term (chronic) condition. There is no cure, but there are treatments and things that you can do at home to help relieve symptoms. Treating the underlying condition that causes gastroparesis can also help relieve symptoms What are the causes? In many cases, the cause of this condition is not known. Possible causes include: A hormone (endocrine) disorder, such as hypothyroidism or diabetes. A nervous system disease, such as Parkinson's disease or multiple sclerosis. Cancer, infection, or surgery that affects the stomach or vagus nerve. The vagus nerve runs from your chest, through your neck, and to the lower part of your brain. A connective tissue disorder, such as scleroderma. Certain  medicines. What increases the risk? You are more likely to develop this condition if: You have certain disorders or diseases. These may include: An endocrine disorder. An eating disorder. Amyloidosis. Scleroderma. Parkinson's disease. Multiple sclerosis. Cancer or infection of the stomach or the vagus nerve. You have had surgery on your stomach or vagus nerve. You take certain medicines. You are female. What are the signs or symptoms? Symptoms of this condition include: Feeling full after eating very little or a loss of appetite. Nausea, vomiting, or heartburn. Bloating of your abdomen. Inconsistent blood sugar (glucose) levels on blood tests. Unexplained weight loss. Acid from the stomach coming up into the esophagus (gastroesophageal reflux). Sudden tightening (spasm) of the stomach, which can be painful. Symptoms may come and go. Some people may not notice any symptoms. How is this diagnosed? This condition is diagnosed with tests, such as: Tests that check how long it takes food to move through the stomach and intestines. These tests include: Upper gastrointestinal (GI) series. For this test, you drink a liquid that shows up well on X-rays, and then X-rays are taken of your intestines. Gastric emptying scintigraphy. For this test, you eat food that contains a small amount of radioactive material, and then scans are taken. Wireless capsule GI monitoring system. For this test, you swallow a pill (capsule) that records information about how foods and fluid move through your stomach. Gastric manometry. For this test,  a tube is passed down your throat and into your stomach to measure electrical and muscular activity. Endoscopy. For this test, a long, thin tube with a camera and light on the end is passed down your throat and into your stomach to check for problems in your stomach lining. Ultrasound. This test uses sound waves to create images of the inside of your body. This can help  rule out gallbladder disease or pancreatitis as a cause of your symptoms. How is this treated? There is no cure for this condition, but treatment and home care may relieve symptoms. Treatment may include: Treating the underlying cause. Managing your symptoms by making changes to your diet and exercise habits. Taking medicines to control nausea and vomiting and to stimulate stomach muscles. Getting food through a feeding tube in the hospital. This may be done in severe cases. Having surgery to insert a device called a gastric electrical stimulator into your body. This device helps improve stomach emptying and control nausea and vomiting. Follow these instructions at home: Take over-the-counter and prescription medicines only as told by your health care provider. Follow instructions from your health care provider about eating or drinking restrictions. Your health care provider may recommend that you: Eat smaller meals more often. Eat low-fat foods. Eat low-fiber forms of high-fiber foods. For example, eat cooked vegetables instead of raw vegetables. Have only liquid foods instead of solid foods. Liquid foods are easier to digest. Drink enough fluid to keep your urine pale yellow. Exercise as often as told by your health care provider. Keep all follow-up visits. This is important. Contact a health care provider if you: Notice that your symptoms do not improve with treatment. Have new symptoms. Get help right away if you: Have severe pain in your abdomen that does not improve with treatment. Have nausea that is severe or does not go away. Vomit every time you drink fluids. Summary Gastroparesis is a long-term (chronic) condition in which food takes longer than normal to empty from the stomach. Symptoms include nausea, vomiting, heartburn, bloating of your abdomen, and loss of appetite. Eating smaller portions, low-fat foods, and low-fiber forms of high-fiber foods may help you manage your  symptoms. Get help right away if you have severe pain in your abdomen. This information is not intended to replace advice given to you by your health care provider. Make sure you discuss any questions you have with your health care provider. Document Revised: 06/25/2019 Document Reviewed: 06/25/2019 Elsevier Patient Education  2021 Elsevier Inc.  _______________________________________________________ It was a pleasure to see you today!  Thank you for trusting me with your gastrointestinal care!

## 2022-08-05 NOTE — Progress Notes (Signed)
Agree with assessment / plan as outlined.  

## 2022-08-30 ENCOUNTER — Ambulatory Visit
Admission: EM | Admit: 2022-08-30 | Discharge: 2022-08-30 | Disposition: A | Discharge status: Home / Self Care | Payer: Medicare Other | Attending: Urgent Care | Admitting: Urgent Care

## 2022-08-30 DIAGNOSIS — R159 Full incontinence of feces: Secondary | ICD-10-CM | POA: Insufficient documentation

## 2022-08-30 DIAGNOSIS — N3001 Acute cystitis with hematuria: Secondary | ICD-10-CM | POA: Insufficient documentation

## 2022-08-30 LAB — POCT URINALYSIS DIP (MANUAL ENTRY)
Bilirubin, UA: NEGATIVE
Glucose, UA: NEGATIVE mg/dL
Ketones, POC UA: NEGATIVE mg/dL
Nitrite, UA: NEGATIVE
Protein Ur, POC: NEGATIVE mg/dL
Spec Grav, UA: 1.02 (ref 1.010–1.025)
Urobilinogen, UA: 4 E.U./dL — AB
pH, UA: 5.5 (ref 5.0–8.0)

## 2022-08-30 MED ORDER — CEPHALEXIN 500 MG PO CAPS: 500.0000 mg | ORAL_CAPSULE | Freq: Two times a day (BID) | ORAL | 0 refills | Status: DC

## 2022-08-30 NOTE — Discharge Instructions (Addendum)
Please start cephalexin (Keflex) to address an urinary tract infection. Make sure you hydrate very well with plain water and a quantity of 80 ounces of water a day.  Please limit drinks that are considered urinary irritants such as soda, sweet tea, coffee, energy drinks, alcohol.  These can worsen your urinary and genital symptoms but also be the source of them.  I will let you know about your urine culture results through MyChart to see if we need to prescribe or change your antibiotics based off of those results.

## 2022-08-30 NOTE — ED Triage Notes (Signed)
Pt c/o dysuria, urinary freq started last night-NAD-steady gait

## 2022-08-30 NOTE — ED Provider Notes (Signed)
[facility]  Note:  This document was prepared using Conservation officer, historic buildings and may include unintentional dictation errors.  MRN: [id number] DOB: 1946-01-17  Subjective:   Jillian Guerrero is a 77 y.o. female presenting for 1 day history of acute onset painful urination, urinary frequency and urgency.  Patient has had difficulty with UTIs recently.  Unfortunately she thinks it is related to fecal incontinence which she is seeing a specialist for.  Hydrates very well with 2 to 3 L of water daily.  Avoids urinary irritants.   Current Facility-Administered Medications:    albuterol (PROVENTIL) (2.5 MG/3ML) 0.083% nebulizer solution 2.5 mg, 2.5 mg, Nebulization, Once, Parrett, Tammy S, NP  Current Outpatient Medications:    alendronate (FOSAMAX) 70 MG tablet, Take 1 tablet (70 mg total) by mouth every 7 (seven) days. Take with a full glass of water on an empty stomach., Disp: 12 tablet, Rfl: 3   atorvastatin (LIPITOR) 10 MG tablet, Take 1 tablet (10 mg total) by mouth daily., Disp: 90 tablet, Rfl: 3   calcium citrate-vitamin D (CITRACAL+D) 315-200 MG-UNIT tablet, Take 1 tablet by mouth 2 (two) times daily., Disp: , Rfl:    cetirizine (ZYRTEC) 10 MG tablet, Take 10 mg by mouth daily., Disp: , Rfl:    gabapentin (NEURONTIN) 600 MG tablet, Take 1 tablet (600 mg total) by mouth 2 (two) times daily., Disp: 180 tablet, Rfl: 1   naproxen (NAPROSYN) 500 MG tablet, TAKE ONE TABLET BY MOUTH TWICE DAILY FOR TEN DAYS AND THEN ONE TIME DAILY, Disp: 30 tablet, Rfl: 0   pantoprazole (PROTONIX) 40 MG tablet, Take 1 tablet (40 mg total) by mouth daily., Disp: 90 tablet, Rfl: 3   tiZANidine (ZANAFLEX) 4 MG tablet, TAKE 1 TABLET AT BEDTIME, Disp: 90 tablet, Rfl: 3   valACYclovir (VALTREX) 1000 MG tablet, Take 1 tablet (1,000 mg total) by mouth daily., Disp: 90 tablet, Rfl: 3   Allergies  Allergen Reactions   Ciprofloxacin Swelling   Collagen     Redness, swelling    Past  Medical History:  Diagnosis Date   Asthma    GERD (gastroesophageal reflux disease)    Hyperlipidemia    Lumbar compression fracture (HCC)    Macular degeneration, wet (HCC)    both eyes   Osteopenia    Osteoporosis    Peripheral neuropathy      Past Surgical History:  Procedure Laterality Date   APPENDECTOMY  1956   BLADDER SUSPENSION  2003   BREAST BIOPSY  2015   hyaluronic acid viscosupplementation Bilateral    knees - 01/16/2018 x2   IR RADIOLOGIST EVAL & MGMT  01/21/2021   KYPHOPLASTY  10/2018   TONSILLECTOMY  1964   UPPER GI ENDOSCOPY  03/08/2019   gastritis    Family History  Problem Relation Age of Onset   Arthritis Mother    Heart disease Mother    Arthritis Father    Depression Father    Arthritis Brother    High blood pressure Brother    High Cholesterol Brother    Arthritis Maternal Grandfather    Asthma Paternal Grandmother    Depression Paternal Grandmother    Heart disease Paternal Grandmother    High Cholesterol Paternal Grandmother    Arthritis Daughter    Arthritis Son    Heart attack Son    High Cholesterol Son    Colon cancer Neg Hx    Esophageal cancer Neg Hx    Rectal cancer Neg Hx  Social History   Tobacco Use   Smoking status: Former    Packs/day: 1.00    Years: 40.00    Additional pack years: 0.00    Total pack years: 40.00    Types: Cigarettes   Smokeless tobacco: Never   Tobacco comments:    Quit in 2002  Vaping Use   Vaping Use: Never used  Substance Use Topics   Alcohol use: Never   Drug use: Never    ROS   Objective:   Vitals: BP (!) 156/70 (BP Location: Right Arm)   Pulse 98   Temp 97.8 F (36.6 C) (Oral)   Resp 20   SpO2 96%   Physical Exam Constitutional:      General: She is not in acute distress.    Appearance: Normal appearance. She is well-developed. She is not ill-appearing, toxic-appearing or diaphoretic.  HENT:     Head: Normocephalic and atraumatic.     Nose: Nose normal.      Mouth/Throat:     Mouth: Mucous membranes are moist.  Eyes:     General: No scleral icterus.       Right eye: No discharge.        Left eye: No discharge.     Extraocular Movements: Extraocular movements intact.  Cardiovascular:     Rate and Rhythm: Normal rate.  Pulmonary:     Effort: Pulmonary effort is normal.  Skin:    General: Skin is warm and dry.  Neurological:     General: No focal deficit present.     Mental Status: She is alert and oriented to person, place, and time.  Psychiatric:        Mood and Affect: Mood normal.        Behavior: Behavior normal.     Results for orders placed or performed during the hospital encounter of 08/30/22 (from the past 24 hour(s))  POCT urinalysis dipstick     Status: Abnormal   Collection Time: 08/30/22  4:23 PM  Result Value Ref Range   Color, UA yellow yellow   Clarity, UA clear clear   Glucose, UA negative negative mg/dL   Bilirubin, UA negative negative   Ketones, POC UA negative negative mg/dL   Spec Grav, UA 1.610 9.604 - 1.025   Blood, UA small (A) negative   pH, UA 5.5 5.0 - 8.0   Protein Ur, POC negative negative mg/dL   Urobilinogen, UA 4.0 (A) 0.2 or 1.0 E.U./dL   Nitrite, UA Negative Negative   Leukocytes, UA Small (1+) (A) Negative    Assessment and Plan :   PDMP not reviewed this encounter.  1. Acute cystitis with hematuria    Start cephalexin to cover for acute cystitis, urine culture pending.  Recommended aggressive hydration, limiting urinary irritants. Counseled patient on potential for adverse effects with medications prescribed/recommended today, ER and return-to-clinic precautions discussed, patient verbalized understanding.    Wallis Bamberg, [place] 08/30/22 [id number]

## 2022-09-02 LAB — URINE CULTURE: Culture: 30000 — AB

## 2022-09-06 ENCOUNTER — Other Ambulatory Visit: Payer: Self-pay

## 2022-09-06 ENCOUNTER — Other Ambulatory Visit (INDEPENDENT_AMBULATORY_CARE_PROVIDER_SITE_OTHER): Payer: Medicare Other

## 2022-09-06 ENCOUNTER — Ambulatory Visit (INDEPENDENT_AMBULATORY_CARE_PROVIDER_SITE_OTHER): Payer: Medicare Other | Admitting: Orthopedic Surgery

## 2022-09-06 DIAGNOSIS — M5416 Radiculopathy, lumbar region: Secondary | ICD-10-CM | POA: Diagnosis not present

## 2022-09-06 DIAGNOSIS — M7061 Trochanteric bursitis, right hip: Secondary | ICD-10-CM

## 2022-09-06 DIAGNOSIS — M25551 Pain in right hip: Secondary | ICD-10-CM

## 2022-09-07 ENCOUNTER — Encounter: Payer: Self-pay | Admitting: Orthopedic Surgery

## 2022-09-07 DIAGNOSIS — M7061 Trochanteric bursitis, right hip: Secondary | ICD-10-CM

## 2022-09-07 MED ORDER — BUPIVACAINE HCL 0.25 % IJ SOLN
4.0000 mL | INTRAMUSCULAR | Status: AC | PRN
Start: 2022-09-06 — End: 2022-09-06
  Administered 2022-09-06: 4 mL via INTRA_ARTICULAR

## 2022-09-07 MED ORDER — LIDOCAINE HCL 1 % IJ SOLN
5.00 mL | INTRAMUSCULAR | Status: AC | PRN
Start: 2022-09-06 — End: 2022-09-06
  Administered 2022-09-06: 5 mL

## 2022-09-07 MED ORDER — METHYLPREDNISOLONE ACETATE 80 MG/ML IJ SUSP
80.0000 mg | INTRAMUSCULAR | Status: AC | PRN
Start: 2022-09-06 — End: 2022-09-06
  Administered 2022-09-06: 80 mg via INTRA_ARTICULAR

## 2022-09-07 NOTE — Progress Notes (Addendum)
Office Visit Note   Patient: Jillian Guerrero           Date of Birth: March 13, 1945           MRN: [id number] Visit Date: 09/06/2022 Requested by: Karie Georges, MD [address] [place],  [place] [address] PCP: Karie Georges, MD  Subjective: Chief Complaint  Patient presents with   Right Leg - Pain    HPI: Jillian Guerrero is a 77 y.o. female who presents to the office reporting right hip and buttock pain for 2 months.  Been causing her to limp recently.  The pain does wake her from sleep at night particularly when she sleeps on that right-hand side.  Denies any radiation below the knee.  Patient does have neuropathy and has some numbness and tingling in both feet.  Has occasional groin pain.  Takes 2 Aleve for sleep.  Knee is okay from injection in April.  Back injection also helped.  Localizes the pain discretely to an area just posterior to the greater trochanteric area.                ROS: All systems reviewed are negative as they relate to the chief complaint within the history of present illness.  Patient denies fevers or chills.  Assessment & Plan: Visit Diagnoses:  1. Lumbar radiculopathy   2. Greater trochanteric pain syndrome of right lower extremity     Plan: Impression is right hip trochanteric bursitis.  Plan is iliotibial band stretching exercises plus trochanteric injection today on the right-hand side with ultrasound guidance.  Patient tolerated the procedure well.  2-week return with decision for or against gel injection at that time.  May also consider diagnostic and therapeutic injection into the hip joint at that time.  Follow-Up Instructions: No follow-ups on file.   Orders:  Orders Placed This Encounter  Procedures   XR HIP UNILAT W OR W/O PELVIS 2-3 VIEWS RIGHT   US Guided Needle Placement - No Linked Charges   No orders of the defined types were placed in this encounter.     Procedures: Large Joint Inj: R greater trochanter on 09/06/2022  9:27 PM Indications: pain and diagnostic evaluation Details: 22 G 3.5 in needle, ultrasound-guided lateral approach  Arthrogram: No  Medications: 5 mL lidocaine 1 %; 80 mg methylPREDNISolone acetate 80 MG/ML; 4 mL bupivacaine 0.25 % Outcome: tolerated well, no immediate complications Procedure, treatment alternatives, risks and benefits explained, specific risks discussed. Consent was given by the patient. Immediately prior to procedure a time out was called to verify the correct patient, procedure, equipment, support staff and site/side marked as required. Patient was prepped and draped in the usual sterile fashion.       Clinical Data: No additional findings.  Objective: Vital Signs: There were no vitals taken for this visit.  Physical Exam:  Constitutional: Patient appears well-developed HEENT:  Head: Normocephalic Eyes:EOM are normal Neck: Normal range of motion Cardiovascular: Normal rate Pulmonary/chest: Effort normal Neurologic: Patient is alert Skin: Skin is warm Psychiatric: Patient has normal mood and affect  Ortho Exam: Ortho exam demonstrates normal gait and alignment.  Pedal pulses intact.  No groin pain with internal/external rotation of the right or left leg.  Slight restriction of external rotation on the right compared to the left.  Does have trochanteric tenderness to direct palpation but is able to maintain level pelvis in standing with legs alternatively single leg.  Specialty Comments:  MRI LUMBAR SPINE WITHOUT CONTRAST  TECHNIQUE: Multiplanar, multisequence MR imaging of the lumbar spine was performed. No intravenous contrast was administered.   COMPARISON:  10/20/2020   FINDINGS: Segmentation:  Standard.   Alignment:  Physiologic.   Vertebrae: No acute fracture, evidence of discitis, or aggressive bone lesion. L1 vertebral body compression fracture unchanged compared with 10/20/2020 with 50% height loss and mild marrow edema along the  superior posterior corner of the L1 vertebral body. 4 mm retropulsion of the superior posterior margin of the L1 vertebral body mildly impressing upon the thecal sac and resulting in mild spinal stenosis. Benign hemangioma in the L4 vertebral body.   Conus medullaris and cauda equina: Conus extends to the L1 level. Conus and cauda equina appear normal.   Paraspinal and other soft tissues: No acute paraspinal abnormality.   Disc levels:   Disc spaces: Disc desiccation throughout the lumbar spine. Mild disc height loss at L3-4.   T11-12: Mild broad-based disc bulge. Moderate right and mild left facet arthropathy. No foraminal or central canal stenosis.   T12-L1: Broad-based disc bulge. Mild bilateral facet arthropathy. No foraminal or central canal stenosis.   L1-L2: Mild broad-based disc bulge. Mild bilateral facet arthropathy. Mild bilateral foraminal stenosis.   L2-L3: Mild broad-based disc bulge. Mild bilateral facet arthropathy. No foraminal or central canal stenosis.   L3-L4: Mild broad-based disc bulge. Moderate bilateral facet arthropathy. Mild spinal stenosis. Bilateral lateral recess stenosis. Mild bilateral foraminal stenosis.   L4-L5: Mild broad-based disc bulge. Mild bilateral facet arthropathy. No foraminal or central canal stenosis.   L5-S1: Mild broad-based disc bulge. Moderate bilateral facet arthropathy. No foraminal or central canal stenosis.   IMPRESSION: 1. L1 vertebral body compression fracture unchanged compared with 10/20/2020 with 50% height loss and mild residual marrow edema along the superior posterior corner of the L1 vertebral body. 4 mm retropulsion of the superior posterior margin of the L1 vertebral body mildly impressing upon the thecal sac and resulting in mild spinal stenosis. 2. Lumbar spine spondylosis as described above.     Electronically Signed   By: Elige Ko M.D.   On: 03/08/2022 15:34  Imaging: No results  found.   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   Past Medical History:  Diagnosis Date   Asthma    GERD (gastroesophageal reflux disease)    Hyperlipidemia    Lumbar compression fracture (HCC)    Macular degeneration, wet (HCC)    both eyes   Osteopenia    Osteoporosis    Peripheral neuropathy     Family History  Problem Relation Age of Onset   Arthritis Mother    Heart disease Mother    Arthritis Father    Depression Father    Arthritis Brother    High blood pressure Brother    High Cholesterol Brother    Arthritis Maternal Grandfather    Asthma Paternal Grandmother    Depression Paternal Grandmother    Heart disease Paternal Grandmother    High Cholesterol Paternal Grandmother    Arthritis Daughter    Arthritis Son    Heart attack Son    High Cholesterol Son    Colon cancer Neg Hx  Esophageal cancer Neg Hx    Rectal cancer Neg Hx     Past Surgical History:  Procedure Laterality Date   APPENDECTOMY  1956   BLADDER SUSPENSION  2003   BREAST BIOPSY  2015   hyaluronic acid viscosupplementation Bilateral    knees - 01/16/2018 x2   IR RADIOLOGIST EVAL & MGMT  01/21/2021   KYPHOPLASTY  10/2018   TONSILLECTOMY  1964   UPPER GI ENDOSCOPY  03/08/2019   gastritis   Social History   Occupational History   Occupation: retired  Tobacco Use   Smoking status: Former    Packs/day: 1.00    Years: 40.00    Additional pack years: 0.00    Total pack years: 40.00    Types: Cigarettes   Smokeless tobacco: Never   Tobacco comments:    Quit in 2002  Vaping Use   Vaping Use: Never used  Substance and Sexual  Activity   Alcohol use: Never   Drug use: Never   Sexual activity: Not on file

## 2022-09-12 ENCOUNTER — Other Ambulatory Visit: Payer: Self-pay | Admitting: Family Medicine

## 2022-09-12 DIAGNOSIS — K219 Gastro-esophageal reflux disease without esophagitis: Secondary | ICD-10-CM

## 2022-09-29 ENCOUNTER — Other Ambulatory Visit: Payer: Self-pay | Admitting: Family Medicine

## 2022-09-29 ENCOUNTER — Encounter (INDEPENDENT_AMBULATORY_CARE_PROVIDER_SITE_OTHER): Payer: Self-pay

## 2022-09-29 DIAGNOSIS — M810 Age-related osteoporosis without current pathological fracture: Secondary | ICD-10-CM

## 2022-10-15 ENCOUNTER — Other Ambulatory Visit: Payer: Self-pay | Admitting: Family Medicine

## 2022-10-15 DIAGNOSIS — E782 Mixed hyperlipidemia: Secondary | ICD-10-CM

## 2022-10-20 ENCOUNTER — Ambulatory Visit: Payer: Self-pay

## 2022-10-20 ENCOUNTER — Encounter: Payer: Self-pay | Admitting: Orthopedic Surgery

## 2022-10-20 ENCOUNTER — Ambulatory Visit (INDEPENDENT_AMBULATORY_CARE_PROVIDER_SITE_OTHER): Payer: Medicare Other | Admitting: Orthopedic Surgery

## 2022-10-20 ENCOUNTER — Ambulatory Visit (INDEPENDENT_AMBULATORY_CARE_PROVIDER_SITE_OTHER): Payer: Medicare Other

## 2022-10-20 VITALS — Ht 67.0 in | Wt 201.0 lb

## 2022-10-20 DIAGNOSIS — Z0000 Encounter for general adult medical examination without abnormal findings: Secondary | ICD-10-CM

## 2022-10-20 DIAGNOSIS — M1611 Unilateral primary osteoarthritis, right hip: Secondary | ICD-10-CM | POA: Diagnosis not present

## 2022-10-20 MED ORDER — LIDOCAINE HCL 1 % IJ SOLN
5.0000 mL | INTRAMUSCULAR | Status: AC | PRN
Start: 2022-10-20 — End: 2022-10-20
  Administered 2022-10-20: 5 mL

## 2022-10-20 MED ORDER — METHYLPREDNISOLONE ACETATE 80 MG/ML IJ SUSP
80.0000 mg | INTRAMUSCULAR | Status: AC | PRN
Start: 2022-10-20 — End: 2022-10-20
  Administered 2022-10-20: 80 mg via INTRA_ARTICULAR

## 2022-10-20 MED ORDER — BUPIVACAINE HCL 0.25 % IJ SOLN
4.0000 mL | INTRAMUSCULAR | Status: AC | PRN
Start: 2022-10-20 — End: 2022-10-20
  Administered 2022-10-20: 4 mL via INTRA_ARTICULAR

## 2022-10-20 NOTE — Progress Notes (Signed)
Subjective:   Jillian Guerrero is a 77 y.o. female who presents for Medicare Annual (Subsequent) preventive examination.  Visit Complete: Virtual  I connected with  Jillian Guerrero on 10/20/22 by a audio enabled telemedicine application and verified that I am speaking with the correct person using two identifiers.  Patient Location: Home  Provider Location: Home Office  I discussed the limitations of evaluation and management by telemedicine. The patient expressed understanding and agreed to proceed.  Patient Medicare AWV questionnaire was completed by the patient on 10/01/22; I have confirmed that all information answered by patient is correct and no changes since this date.  Review of Systems    Vital Signs: Unable to obtain new vitals due to this being a telehealth visit.  Cardiac Risk Factors include: advanced age (>67men, >57 women)     Objective:    Today's Vitals   10/20/22 1504  Weight: 201 lb (91.2 kg)  Height: 5\' 7"  (1.702 m)   Body mass index is 31.48 kg/m.     10/20/2022    3:15 PM 10/16/2021   12:52 PM 10/15/2020   10:54 AM 05/08/2020    7:57 AM  Advanced Directives  Does Patient Have a Medical Advance Directive? Yes Yes Yes Yes  Type of Estate agent of [place];Living will Healthcare Power of [place];Living will Healthcare Power of [place];Living will Healthcare Power of [place];Living will;Out of facility DNR (pink MOST or yellow form)  Does patient want to make changes to medical advance directive?  No - Patient declined    Copy of Healthcare Power of Attorney in Chart? No - copy requested No - copy requested No - copy requested     Current Medications (verified) Outpatient Encounter Medications as of 10/20/2022  Medication Sig   alendronate (FOSAMAX) 70 MG tablet TAKE 1 TABLET EVERY 7 DAYS - TAKE WITH A FULL GLASS OF WATER ON AN EMPTY STOMACH.   atorvastatin (LIPITOR) 10 MG tablet TAKE 1 TABLET EVERY DAY   calcium citrate-vitamin D  (CITRACAL+D) 315-200 MG-UNIT tablet Take 1 tablet by mouth 2 (two) times daily.   cephALEXin (KEFLEX) 500 MG capsule Take 1 capsule (500 mg total) by mouth 2 (two) times daily.   cetirizine (ZYRTEC) 10 MG tablet Take 10 mg by mouth daily.   gabapentin (NEURONTIN) 600 MG tablet Take 1 tablet (600 mg total) by mouth 2 (two) times daily.   pantoprazole (PROTONIX) 40 MG tablet TAKE 1 TABLET EVERY DAY   tiZANidine (ZANAFLEX) 4 MG tablet TAKE 1 TABLET AT BEDTIME   valACYclovir (VALTREX) 1000 MG tablet Take 1 tablet (1,000 mg total) by mouth daily.   [DISCONTINUED] naproxen (NAPROSYN) 500 MG tablet TAKE ONE TABLET BY MOUTH TWICE DAILY FOR TEN DAYS AND THEN ONE TIME DAILY   Facility-Administered Encounter Medications as of 10/20/2022  Medication   albuterol (PROVENTIL) (2.5 MG/3ML) 0.083% nebulizer solution 2.5 mg    Allergies (verified) Ciprofloxacin and Collagen   History: Past Medical History:  Diagnosis Date   Asthma    GERD (gastroesophageal reflux disease)    Hyperlipidemia    Lumbar compression fracture (HCC)    Macular degeneration, wet (HCC)    both eyes   Osteopenia    Osteoporosis    Peripheral neuropathy    Past Surgical History:  Procedure Laterality Date   APPENDECTOMY  1956   BLADDER SUSPENSION  2003   BREAST BIOPSY  2015   hyaluronic acid viscosupplementation Bilateral    knees - 01/16/2018 x2   IR  RADIOLOGIST EVAL & MGMT  01/21/2021   KYPHOPLASTY  10/2018   TONSILLECTOMY  1964   UPPER GI ENDOSCOPY  03/08/2019   gastritis   Family History  Problem Relation Age of Onset   Arthritis Mother    Heart disease Mother    Arthritis Father    Depression Father    Arthritis Brother    High blood pressure Brother    High Cholesterol Brother    Arthritis Maternal Grandfather    Asthma Paternal Grandmother    Depression Paternal Grandmother    Heart disease Paternal Grandmother    High Cholesterol Paternal Grandmother    Arthritis Daughter    Arthritis Son     Heart attack Son    High Cholesterol Son    Colon cancer Neg Hx    Esophageal cancer Neg Hx    Rectal cancer Neg Hx    Social History   Socioeconomic History   Marital status: Widowed    Spouse name: Not on file   Number of children: 2   Years of education: Not on file   Highest education level: 12th grade  Occupational History   Occupation: retired  Tobacco Use   Smoking status: Former    Current packs/day: 1.00    Average packs/day: 1 pack/day for 40.0 years (40.0 ttl pk-yrs)    Types: Cigarettes   Smokeless tobacco: Never   Tobacco comments:    Quit in 2002  Vaping Use   Vaping status: Never Used  Substance and Sexual Activity   Alcohol use: Never   Drug use: Never   Sexual activity: Not on file  Other Topics Concern   Not on file  Social History Narrative   Right handed    Lives alone    Social Determinants of Health   Financial Resource Strain: Low Risk  (10/20/2022)   Overall Financial Resource Strain (CARDIA)    Difficulty of Paying Living Expenses: Not hard at all  Food Insecurity: No Food Insecurity (10/20/2022)   Hunger Vital Sign    Worried About Running Out of Food in the Last Year: Never true    Ran Out of Food in the Last Year: Never true  Transportation Needs: No Transportation Needs (10/20/2022)   PRAPARE - Administrator, Civil Service (Medical): No    Lack of Transportation (Non-Medical): No  Physical Activity: Inactive (10/20/2022)   Exercise Vital Sign    Days of Exercise per Week: 0 days    Minutes of Exercise per Session: 0 min  Stress: No Stress Concern Present (10/20/2022)   [employer] of Occupational Health - Occupational Stress Questionnaire    Feeling of Stress : Not at all  Social Connections: Moderately Integrated (10/20/2022)   Social Connection and Isolation Panel [NHANES]    Frequency of Communication with Friends and Family: More than three times a week    Frequency of Social Gatherings with Friends and Family:  More than three times a week    Attends Religious Services: More than 4 times per year    Active Member of [employer] or Organizations: Yes    Attends Banker Meetings: More than 4 times per year    Marital Status: Widowed    Tobacco Counseling Counseling given: Not Answered Tobacco comments: Quit in 2002   Clinical Intake:  Pre-visit preparation completed: Yes  Pain : No/denies pain     BMI - recorded: 31.48 Nutritional Status: BMI > 30  Obese Nutritional Risks: None Diabetes: No  How often do you need to have someone help you when you read instructions, pamphlets, or other written materials from your doctor or pharmacy?: 1 - Never  Interpreter Needed?: No  Information entered by :: Theresa Mulligan LPN   Activities of Daily Living    10/20/2022    3:13 PM  In your present state of health, do you have any difficulty performing the following activities:  Hearing? 0  Vision? 0  Difficulty concentrating or making decisions? 0  Walking or climbing stairs? 0  Dressing or bathing? 0  Doing errands, shopping? 0  Preparing Food and eating ? N  Using the Toilet? N  In the past six months, have you accidently leaked urine? N  Do you have problems with loss of bowel control? N  Managing your Medications? N  Managing your Finances? N  Housekeeping or managing your Housekeeping? N    Patient Care Team: Karie Georges, MD as PCP - General (Family Medicine) Glendale Chard, DO as Consulting Physician (Neurology)  Indicate any recent Medical Services you may have received from other than [facility] providers in the past year (date may be approximate).     Assessment:   This is a routine wellness examination for [place].  Hearing/Vision screen Hearing Screening - Comments:: Denies hearing difficulties   Vision Screening - Comments:: Wears reading glasses - up to date with routine eye exams with  [facility]  Dietary issues and exercise activities discussed:      Goals Addressed               This Visit's Progress     Try to be healthy (pt-stated)         Depression Screen    10/20/2022    3:11 PM 11/10/2021    1:50 PM 10/16/2021   12:46 PM 10/15/2020   10:56 AM 10/15/2020   10:52 AM 09/26/2020    2:41 PM  PHQ 2/9 Scores  PHQ - 2 Score 0 0 0 0 0 0  PHQ- 9 Score  0    0    Fall Risk    10/20/2022    3:13 PM 04/30/2022    2:48 PM 10/16/2021   12:51 PM 02/10/2021   12:59 PM 10/15/2020   10:55 AM  Fall Risk   Falls in the past year? 0 0 0 0 0  Number falls in past yr: 0  0  0  Injury with Fall? 0  0  0  Risk for fall due to : No Fall Risks  No Fall Risks  No Fall Risks  Follow up Falls prevention discussed    Falls evaluation completed    MEDICARE RISK AT HOME: Medicare Risk at Home Any stairs in or around the home?: Yes If so, are there any without handrails?: No Home free of loose throw rugs in walkways, pet beds, electrical cords, etc?: Yes Adequate lighting in your home to reduce risk of falls?: Yes Life alert?: No Use of a cane, walker or w/c?: No Grab bars in the bathroom?: Yes Shower chair or bench in shower?: Yes Elevated toilet seat or a handicapped toilet?: Yes  TIMED UP AND GO:  Was the test performed?  No    Cognitive Function:        10/20/2022    3:15 PM 10/16/2021   12:52 PM  6CIT Screen  What Year? 0 points 0 points  What month? 0 points 0 points  What time? 0 points 0 points  Count back from 20 0 points 0 points  Months in reverse 0 points 0 points  Repeat phrase 0 points 0 points  Total Score 0 points 0 points    Immunizations Immunization History  Administered Date(s) Administered   Fluad Quad(high Dose 65+) 11/10/2021   Hepatitis A 01/02/2014   Hepatitis A, Adult 01/02/2014, 02/11/2014   Influenza-Unspecified 02/22/2020   Moderna Sars-Covid-2 Vaccination 06/12/2019, 07/11/2019, 04/08/2020, 01/19/2021   Pneumococcal Conjugate-13 12/18/2013   Pneumococcal Polysaccharide-23 09/12/2012    Td 11/08/2013   Tdap 01/02/2014   Typhoid Inactivated 01/02/2014   Typhoid Live 01/02/2014   Yellow Fever 01/02/2014   Zoster Recombinant(Shingrix) 12/12/2020, 04/01/2021    TDAP status: Up to date  Flu Vaccine status: Due, Education has been provided regarding the importance of this vaccine. Advised may receive this vaccine at local pharmacy or Health Dept. Aware to provide a copy of the vaccination record if obtained from local pharmacy or Health Dept. Verbalized acceptance and understanding.  Pneumococcal vaccine status: Up to date  Covid-19 vaccine status: Declined, Education has been provided regarding the importance of this vaccine but patient still declined. Advised may receive this vaccine at local pharmacy or Health Dept.or vaccine clinic. Aware to provide a copy of the vaccination record if obtained from local pharmacy or Health Dept. Verbalized acceptance and understanding.  Qualifies for Shingles Vaccine? Yes   Zostavax completed Yes   Shingrix Completed?: Yes  Screening Tests Health Maintenance  Topic Date Due   Lung Cancer Screening  Never done   COVID-19 Vaccine (5 - 2023-24 season) 10/30/2021   INFLUENZA VACCINE  09/30/2022   Medicare Annual Wellness (AWV)  10/20/2023   DTaP/Tdap/Td (3 - Td or Tdap) 01/03/2024   Pneumonia Vaccine 64+ Years old  Completed   DEXA SCAN  Completed   Hepatitis C Screening  Completed   Zoster Vaccines- Shingrix  Completed   HPV VACCINES  Aged Out   Colonoscopy  Discontinued    Health Maintenance  Health Maintenance Due  Topic Date Due   Lung Cancer Screening  Never done   COVID-19 Vaccine (5 - 2023-24 season) 10/30/2021   INFLUENZA VACCINE  09/30/2022    Colorectal cancer screening: No longer required.   Mammogram status: No longer required due to Age.  Bone Density status: Completed 06/07/20. Results reflect: Bone density results: OSTEOPOROSIS. Repeat every   years.  Lung Cancer Screening: (Low Dose CT Chest recommended if  Age 15-80 years, 20 pack-year currently smoking OR have quit w/in 15years.) does not qualify.     Additional Screening:  Hepatitis C Screening: does qualify; Completed 11/30/19  Vision Screening: Recommended annual ophthalmology exams for early detection of glaucoma and other disorders of the eye. Is the patient up to date with their annual eye exam?  Yes  Who is the provider or what is the name of the office in which the patient attends annual eye exams? [facility] If pt is not established with a provider, would they like to be referred to a provider to establish care? No .   Dental Screening: Recommended annual dental exams for proper oral hygiene    Community Resource Referral / Chronic Care Management:  CRR required this visit?  No   CCM required this visit?  No     Plan:     I have personally reviewed and noted the following in the patient's chart:   Medical and social history Use of alcohol, tobacco or illicit drugs  Current medications and supplements including opioid  prescriptions. Patient is not currently taking opioid prescriptions. Functional ability and status Nutritional status Physical activity Advanced directives List of other physicians Hospitalizations, surgeries, and ER visits in previous 12 months Vitals Screenings to include cognitive, depression, and falls Referrals and appointments  In addition, I have reviewed and discussed with patient certain preventive protocols, quality metrics, and best practice recommendations. A written personalized care plan for preventive services as well as general preventive health recommendations were provided to patient.     [name], LPN   6/[id number]/[id number]   After Visit Summary: (MyChart) Due to this being a telephonic visit, the after visit summary with patients personalized plan was offered to patient via MyChart   Nurse Notes: None

## 2022-10-20 NOTE — Patient Instructions (Addendum)
Jillian Guerrero , Thank you for taking time to come for your Medicare Wellness Visit. I appreciate your ongoing commitment to your health goals. Please review the following plan we discussed and let me know if I can assist you in the future.   Referrals/Orders/Follow-Ups/Clinician Recommendations:   This is a list of the screening recommended for you and due dates:  Health Maintenance  Topic Date Due   Screening for Lung Cancer  Never done   COVID-19 Vaccine (5 - 2023-24 season) 10/30/2021   Flu Shot  09/30/2022   Medicare Annual Wellness Visit  10/20/2023   DTaP/Tdap/Td vaccine (3 - Td or Tdap) 01/03/2024   Pneumonia Vaccine  Completed   DEXA scan (bone density measurement)  Completed   Hepatitis C Screening  Completed   Zoster (Shingles) Vaccine  Completed   HPV Vaccine  Aged Out   Colon Cancer Screening  Discontinued    Advanced directives: (Copy Requested) Please bring a copy of your health care power of attorney and living will to the office to be added to your chart at your convenience.  Next Medicare Annual Wellness Visit scheduled for next year: Yes

## 2022-10-20 NOTE — Progress Notes (Signed)
Office Visit Note   Patient: Jillian Guerrero           Date of Birth: October 23, 1945           MRN: [id number] Visit Date: 10/20/2022 Requested by: Karie Georges, MD [address] [place],  [place] [address] PCP: Karie Georges, MD  Subjective: Chief Complaint  Patient presents with   Right Leg - Pain    HPI: Jillian Guerrero is a 77 y.o. female who presents to the office reporting right leg and hip pain.  She had a trochanteric bursa injection 09/06/2022 which helped.  She did get some relief and is able to sleep on the right-hand side.  Had prior right knee cortisone injection on 06/25/2022 which also helped..                ROS: All systems reviewed are negative as they relate to the chief complaint within the history of present illness.  Patient denies fevers or chills.  Assessment & Plan: Visit Diagnoses:  1. Arthritis of right hip     Plan: Impression is right hip effusion.  Right hip injection is performed today for pain relief.  We could consider repeating these injections in 4 to 6 months depending on how much relief she gets.  She will follow-up with [name] as needed.  Follow-Up Instructions: No follow-ups on file.   Orders:  Orders Placed This Encounter  Procedures   US Guided Needle Placement - No Linked Charges   No orders of the defined types were placed in this encounter.     Procedures: Large Joint Inj: R hip joint on 10/20/2022 12:44 PM Indications: pain and diagnostic evaluation Details: 18 G 3.5 in needle, ultrasound-guided lateral approach  Arthrogram: No  Medications: 5 mL lidocaine 1 %; 80 mg methylPREDNISolone acetate 80 MG/ML; 4 mL bupivacaine 0.25 % Outcome: tolerated well, no immediate complications Procedure, treatment alternatives, risks and benefits explained, specific risks discussed. Consent was given by the patient. Immediately prior to procedure a time out was called to verify the correct patient, procedure, equipment, support staff  and site/side marked as required. Patient was prepped and draped in the usual sterile fashion.       Clinical Data: No additional findings.  Objective: Vital Signs: There were no vitals taken for this visit.  Physical Exam:  Constitutional: Patient appears well-developed HEENT:  Head: Normocephalic Eyes:EOM are normal Neck: Normal range of motion Cardiovascular: Normal rate Pulmonary/chest: Effort normal Neurologic: Patient is alert Skin: Skin is warm Psychiatric: Patient has normal mood and affect  Ortho Exam: Ortho exam demonstrates slightly antalgic gait to the right.  Mild groin pain with internal and external rotation of the leg.  Pedal pulses intact.  No effusion in either knee.  Decreased tenderness over the right trochanter compared to the left.  Specialty Comments:  MRI LUMBAR SPINE WITHOUT CONTRAST   TECHNIQUE: Multiplanar, multisequence MR imaging of the lumbar spine was performed. No intravenous contrast was administered.   COMPARISON:  10/20/2020   FINDINGS: Segmentation:  Standard.   Alignment:  Physiologic.   Vertebrae: No acute fracture, evidence of discitis, or aggressive bone lesion. L1 vertebral body compression fracture unchanged compared with 10/20/2020 with 50% height loss and mild marrow edema along the superior posterior corner of the L1 vertebral body. 4 mm retropulsion of the superior posterior margin of the L1 vertebral body mildly impressing upon the thecal sac and resulting in mild spinal stenosis. Benign hemangioma in the L4 vertebral  body.   Conus medullaris and cauda equina: Conus extends to the L1 level. Conus and cauda equina appear normal.   Paraspinal and other soft tissues: No acute paraspinal abnormality.   Disc levels:   Disc spaces: Disc desiccation throughout the lumbar spine. Mild disc height loss at L3-4.   T11-12: Mild broad-based disc bulge. Moderate right and mild left facet arthropathy. No foraminal or central  canal stenosis.   T12-L1: Broad-based disc bulge. Mild bilateral facet arthropathy. No foraminal or central canal stenosis.   L1-L2: Mild broad-based disc bulge. Mild bilateral facet arthropathy. Mild bilateral foraminal stenosis.   L2-L3: Mild broad-based disc bulge. Mild bilateral facet arthropathy. No foraminal or central canal stenosis.   L3-L4: Mild broad-based disc bulge. Moderate bilateral facet arthropathy. Mild spinal stenosis. Bilateral lateral recess stenosis. Mild bilateral foraminal stenosis.   L4-L5: Mild broad-based disc bulge. Mild bilateral facet arthropathy. No foraminal or central canal stenosis.   L5-S1: Mild broad-based disc bulge. Moderate bilateral facet arthropathy. No foraminal or central canal stenosis.   IMPRESSION: 1. L1 vertebral body compression fracture unchanged compared with 10/20/2020 with 50% height loss and mild residual marrow edema along the superior posterior corner of the L1 vertebral body. 4 mm retropulsion of the superior posterior margin of the L1 vertebral body mildly impressing upon the thecal sac and resulting in mild spinal stenosis. 2. Lumbar spine spondylosis as described above.     Electronically Signed   By: Elige Ko M.D.   On: 03/08/2022 15:34  Imaging: US Guided Needle Placement - No Linked Charges  Result Date: 10/20/2022 Ultrasound imaging demonstrates needle placement into the right hip joint with extravasation of fluid and no complicating features.    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   Past Medical History:  Diagnosis Date   Asthma    GERD  (gastroesophageal reflux disease)    Hyperlipidemia    Lumbar compression fracture (HCC)    Macular degeneration, wet (HCC)    both eyes   Osteopenia    Osteoporosis    Peripheral neuropathy     Family History  Problem Relation Age of Onset   Arthritis Mother    Heart disease Mother    Arthritis Father    Depression Father    Arthritis Brother    High blood pressure Brother    High Cholesterol Brother    Arthritis Maternal Grandfather    Asthma Paternal Grandmother    Depression Paternal Grandmother    Heart disease Paternal Grandmother    High Cholesterol Paternal Grandmother    Arthritis Daughter    Arthritis Son    Heart attack Son    High Cholesterol Son    Colon cancer Neg Hx    Esophageal cancer Neg Hx    Rectal cancer Neg Hx     Past Surgical History:  Procedure Laterality Date   APPENDECTOMY  1956   BLADDER SUSPENSION  2003   BREAST BIOPSY  2015   hyaluronic acid viscosupplementation Bilateral    knees - 01/16/2018 x2   IR RADIOLOGIST EVAL & MGMT  01/21/2021   KYPHOPLASTY  10/2018   TONSILLECTOMY  1964   UPPER GI  ENDOSCOPY  03/08/2019   gastritis   Social History   Occupational History   Occupation: retired  Tobacco Use   Smoking status: Former    Current packs/day: 1.00    Average packs/day: 1 pack/day for 40.0 years (40.0 ttl pk-yrs)    Types: Cigarettes   Smokeless tobacco: Never   Tobacco comments:    Quit in 2002  Vaping Use   Vaping status: Never Used  Substance and Sexual Activity   Alcohol use: Never   Drug use: Never   Sexual activity: Not on file

## 2022-11-07 ENCOUNTER — Other Ambulatory Visit: Payer: Self-pay | Admitting: Family Medicine

## 2022-11-07 DIAGNOSIS — B029 Zoster without complications: Secondary | ICD-10-CM

## 2022-11-18 NOTE — Progress Notes (Signed)
11/19/2022 Jillian Guerrero [id number] 1945/08/23  Referring provider: Karie Georges, MD Primary GI doctor: Dr. 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  She has change in bowel habits, last colon 2015, will schedule for colonoscopy will need 2 days of prep to evaluation.  We have discussed the risks of bleeding, infection, perforation, medication reactions, and remote risk of death associated with colonoscopy. All questions were answered and the patient acknowledges these risk and wishes to proceed.  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     Patient Care Team: Karie Georges, MD as PCP - General (Family Medicine) Glendale Chard, DO as Consulting Physician (Neurology)  HISTORY OF PRESENT ILLNESS: 77 y.o. female with a past medical history of hyperlipidemia, hypertension, lumbar compression fracture with lower back pain, recurrent cystitis, osteoporosis on Fosamax, chronic constipation, asymptomatic cholelithiasis and mouth fold, gastroparesis, history of gastric bezoars and others listed below presents for evaluation of bowel incontinence.   05/31/2013 colonoscopy at [facility] for change in bowel habits showed internal hemorrhoids, otherwise unremarkable.    I do not see where any biopsies were done, I do not see the quality of the prep listed. Patient brought in notes from 10/04/2017 with [facility] History of gastroparesis I do not see a formal gastric emptying study, previously on domperidone and did well. 04/11/2007 EGD  multiple gastric bezoars  03/08/2019 EGD upper abdominal pain gastritis, do not see where the duodenum was evaluated, pathology showed reactive gastropathy negative H. pylori Negative celiac 2015. 03/04/2007 RUQ [name] slight increase in fat liver asymptomatic gallstones Patient had immunity to hepatitis A but not hepatitis B  08/05/2022 OV with me complaining of CIC with full incontinence of feces, GERD without esophagitis in setting of gastroparesis without formal GES. Given gastroparesis diet consider pelvic floor. 08/05/2022 KUB shows significant moderate to large stool burden in the colon without obstruction.  Thoughts diarrhea likely fecal incontinence with overflow.    Given bowel purge but states she did not have response for 2 days, then she had Bm's for a week with loose stools. She feels she has skinny stools and has abnormal stools. She has mushy stools, no solid stools.  She would like a repeat colonoscopy.  She feels her stools are mushy when it does come out.  She has no AB pain, she has no hematochezia. No weight loss.  She states her UGI symptoms are doing well.  She states her GERD is well controlled, denies nausea, vomiting, epigastric pain.   She denies blood thinner use.  She reports NSAID use.  She denies ETOH use.   She denies tobacco use.  She denies drug use.    She  reports that she has quit smoking. Her smoking use included cigarettes. She has a 40 pack-year smoking history. She has never used smokeless tobacco. She reports that  she does not drink alcohol and does not use drugs.  RELEVANT LABS AND IMAGING: CBC    Component Value Date/Time   WBC 12.0 (H) 05/04/2022 1334    RBC 4.56 05/04/2022 1334   HGB 14.3 05/04/2022 1334   HCT 42.0 05/04/2022 1334   PLT 238.0 05/04/2022 1334   MCV 91.9 05/04/2022 1334   MCHC 34.0 05/04/2022 1334   RDW 14.0 05/04/2022 1334   LYMPHSABS 0.9 05/04/2022 1334   MONOABS 0.6 05/04/2022 1334   EOSABS 0.0 05/04/2022 1334   BASOSABS 0.0 05/04/2022 1334   Recent Labs    05/04/22 1334  HGB 14.3    CMP     Component Value Date/Time   NA 141 05/04/2022 1334   NA 140 09/17/2019 0000   K 4.0 05/04/2022 1334   CL 104 05/04/2022 1334   CO2 27 05/04/2022 1334   GLUCOSE 125 (H) 05/04/2022 1334   BUN 14 05/04/2022 1334   BUN 23 (A) 09/17/2019 0000   CREATININE 0.67 05/04/2022 1334   CALCIUM 9.8 05/04/2022 1334   PROT 6.9 05/04/2022 1334   ALBUMIN 4.2 05/04/2022 1334   AST 14 05/04/2022 1334   ALT 15 05/04/2022 1334   ALKPHOS 40 05/04/2022 1334   BILITOT 0.4 05/04/2022 1334   GFRNONAA 80 09/29/2017 1255      Latest Ref Rng & Units 05/04/2022    1:34 PM 03/24/2020   11:39 AM 09/17/2019   12:00 AM  Hepatic Function  Total Protein 6.0 - 8.3 g/dL 6.9  6.3    Albumin 3.5 - 5.2 g/dL 4.2  4.3  4.1      AST 0 - 37 U/L 14  13    ALT 0 - 35 U/L 15  12    Alk Phosphatase 39 - 117 U/L 40  37    Total Bilirubin 0.2 - 1.2 mg/dL 0.4  0.7       This result is from an external source.      Current Medications:   Current Outpatient Medications (Endocrine & Metabolic):    alendronate (FOSAMAX) 70 MG tablet, TAKE 1 TABLET EVERY 7 DAYS - TAKE WITH A FULL GLASS OF WATER ON AN EMPTY STOMACH.   Current Outpatient Medications (Cardiovascular):    atorvastatin (LIPITOR) 10 MG tablet, TAKE 1 TABLET EVERY DAY   Current Outpatient Medications (Respiratory):    cetirizine (ZYRTEC) 10 MG tablet, Take 10 mg by mouth daily.  Current Facility-Administered Medications (Respiratory):    albuterol (PROVENTIL) (2.5 MG/3ML) 0.083% nebulizer solution 2.5 mg      Current Outpatient Medications (Other):    calcium citrate-vitamin D  (CITRACAL+D) 315-200 MG-UNIT tablet, Take 1 tablet by mouth 2 (two) times daily.   gabapentin (NEURONTIN) 600 MG tablet, Take 1 tablet (600 mg total) by mouth 2 (two) times daily.   pantoprazole (PROTONIX) 40 MG tablet, TAKE 1 TABLET EVERY DAY   tiZANidine (ZANAFLEX) 4 MG tablet, TAKE 1 TABLET AT BEDTIME   valACYclovir (VALTREX) 1000 MG tablet, TAKE 1 TABLET EVERY DAY   Medical History:  Past Medical History:  Diagnosis Date   Asthma    GERD (gastroesophageal reflux disease)    Hyperlipidemia    Lumbar compression fracture (HCC)    Macular degeneration, wet (HCC)    both eyes   Osteopenia    Osteoporosis    Peripheral neuropathy    Allergies:  Allergies  Allergen Reactions   Ciprofloxacin Swelling   Collagen     Redness, swelling     Surgical  History:  She  has a past surgical history that includes Bladder suspension (2003); Breast biopsy (2015); Appendectomy (1956); Tonsillectomy (1964); Kyphoplasty (10/2018); Upper gi endoscopy (03/08/2019); hyaluronic acid viscosupplementation (Bilateral); and IR Radiologist Eval & Mgmt (01/21/2021). Family History:  Her family history includes Arthritis in her brother, daughter, father, maternal grandfather, mother, and son; Asthma in her paternal grandmother; Depression in her father and paternal grandmother; Heart attack in her son; Heart disease in her mother and paternal grandmother; High Cholesterol in her brother, paternal grandmother, and son; High blood pressure in her brother.  REVIEW OF SYSTEMS  : All other systems reviewed and negative except where noted in the History of Present Illness.  PHYSICAL EXAM: BP 110/70   Pulse 74   Ht 5\' 7"  (1.702 m)   Wt 208 lb (94.3 kg)   BMI 32.58 kg/m  General Appearance: Well nourished, in no apparent distress. Head:   Normocephalic and atraumatic. Eyes:  sclerae anicteric,conjunctive pink  Respiratory: Respiratory effort normal, BS equal bilaterally without rales, rhonchi,  wheezing. Cardio: RRR with no MRGs. Peripheral pulses intact.  Abdomen: Soft,  Obese ,active bowel sounds. No tenderness . Without guarding and Without rebound. No masses. Rectal: declines Musculoskeletal: Full ROM, Antalgic gait. Without edema. Skin:  Dry and intact without significant lesions or rashes Neuro: Alert and  oriented x4;  No focal deficits. Psych:  Cooperative. Normal mood and affect.    Doree Albee, PA-C 11:31 AM

## 2022-11-19 ENCOUNTER — Ambulatory Visit (INDEPENDENT_AMBULATORY_CARE_PROVIDER_SITE_OTHER): Payer: Medicare Other | Admitting: Physician Assistant

## 2022-11-19 ENCOUNTER — Encounter: Payer: Self-pay | Admitting: Physician Assistant

## 2022-11-19 VITALS — BP 110/70 | HR 74 | Ht 67.0 in | Wt 208.0 lb

## 2022-11-19 DIAGNOSIS — R194 Change in bowel habit: Secondary | ICD-10-CM

## 2022-11-19 DIAGNOSIS — K5904 Chronic idiopathic constipation: Secondary | ICD-10-CM | POA: Diagnosis not present

## 2022-11-19 DIAGNOSIS — K3184 Gastroparesis: Secondary | ICD-10-CM | POA: Diagnosis not present

## 2022-11-19 DIAGNOSIS — K219 Gastro-esophageal reflux disease without esophagitis: Secondary | ICD-10-CM

## 2022-11-19 DIAGNOSIS — R159 Full incontinence of feces: Secondary | ICD-10-CM

## 2022-11-19 NOTE — Patient Instructions (Addendum)
Linzess 72 mcg and 145 mcg *IBS-C patients may begin to experience relief from belly pain and overall abdominal symptoms (pain, discomfort, and bloating) in about 1 week,  with symptoms typically improving over 12 weeks.  Take at least 30 minutes before the first meal of the day on an empty stomach You can have a loose stool if you eat a high-fat breakfast. Give it at least 7 days, may have more bowel movements during that time.   The diarrhea should go away and you should start having normal, complete, full bowel movements.  It may be helpful to start treatment when you can be near the comfort of your own bathroom, such as a weekend.  After you are out we can send in a prescription if you did well, there is a prescription card  Would suggest seeing pelvic floor PT Suggest seeing GYN or urologist for your bladder issues  FIBER SUPPLEMENT You can do metamucil or fibercon once or twice a day but if this causes gas/bloating please switch to Benefiber or Citracel.  Fiber is good for constipation/diarrhea/irritable bowel syndrome.  It can also help with weight loss and can help lower your bad cholesterol (LDL).  Please do 1 TBSP in the morning in water, coffee, or tea.  It can take up to a month before you can see a difference with your bowel movements.  It is cheapest from [employer], [employer], [employer].     Pelvic Floor Dysfunction, Female Pelvic floor dysfunction (PFD) is a condition that results when the group of muscles and connective tissues that support the organs in the pelvis (pelvic floor muscles) do not work well. These muscles and their connections form a sling that supports the colon and bladder. In women, they also support the uterus. PFD causes pelvic floor muscles to be too weak, too tight, or both. In PFD, muscle movements are not coordinated. This may cause bowel or bladder problems. It may also cause pain. What are the causes? This condition may be caused by an injury to the pelvic  area or by a weakening of pelvic muscles. This often results from pregnancy and childbirth or other types of strain. In many cases, the exact cause is not known. What increases the risk? The following factors may make you more likely to develop this condition: Having chronic bladder tissue inflammation (interstitial cystitis). Being an older person. Being overweight. History of radiation treatment for cancer in the pelvic region. Previous pelvic surgery, such as removal of the uterus (hysterectomy). What are the signs or symptoms? Symptoms of this condition vary and may include: Bladder symptoms, such as: Trouble starting urination and emptying the bladder. Frequent urinary tract infections. Leaking urine when coughing, laughing, or exercising (stress incontinence). Having to pass urine urgently or frequently. Pain when passing urine. Bowel symptoms, such as: Constipation. Urgent or frequent bowel movements. Incomplete bowel movements. Painful bowel movements. Leaking stool or gas. Unexplained genital or rectal pain. Genital or rectal muscle spasms. Low back pain. Other symptoms may include: A heavy, full, or aching feeling in the vagina. A bulge that protrudes into the vagina. Pain during or after sex. How is this diagnosed? This condition may be diagnosed based on: Your symptoms and medical history. A physical exam. During the exam, your health care provider may check your pelvic muscles for tightness, spasm, pain, or weakness. This may include a rectal exam and a pelvic exam. In some cases, you may have diagnostic tests, such as: Electrical muscle function tests. Urine flow testing.  X-ray tests of bowel function. Ultrasound of the pelvic organs. How is this treated? Treatment for this condition depends on the symptoms. Treatment options include: Physical therapy. This may include Kegel exercises to help relax or strengthen the pelvic floor muscles. Biofeedback. This type  of therapy provides feedback on how tight your pelvic floor muscles are so that you can learn to control them. Internal or external massage therapy. A treatment that involves electrical stimulation of the pelvic floor muscles to help control pain (transcutaneous electrical nerve stimulation, or TENS). Sound wave therapy (ultrasound) to reduce muscle spasms. Medicines, such as: Muscle relaxants. Bladder control medicines. Surgery to reconstruct or support pelvic floor muscles may be an option if other treatments do not help. Follow these instructions at home: Activity Do your usual activities as told by your health care provider. Ask your health care provider if you should modify any activities. Do pelvic floor strengthening or relaxing exercises at home as told by your physical therapist. Lifestyle Maintain a healthy weight. Eat foods that are high in fiber, such as beans, whole grains, and fresh fruits and vegetables. Limit foods that are high in fat and processed sugars, such as fried or sweet foods. Manage stress with relaxation techniques such as yoga or meditation. General instructions If you have problems with leakage: Use absorbable pads or wear padded underwear. Wash frequently with mild soap. Keep your genital and anal area as clean and dry as possible. Ask your health care provider if you should try a barrier cream to prevent skin irritation. Take warm baths to relieve pelvic muscle tension or spasms. Take over-the-counter and prescription medicines only as told by your health care provider. Keep all follow-up visits. How is this prevented? The cause of PFD is not always known, but there are a few things you can do to reduce the risk of developing this condition, including: Staying at a healthy weight. Getting regular exercise. Managing stress. Contact a health care provider if: Your symptoms are not improving with home care. You have signs or symptoms of PFD that get worse  at home. You develop new signs or symptoms. You have signs of a urinary tract infection, such as: Fever. Chills. Increased urinary frequency. A burning feeling when urinating. You have not had a bowel movement in 3 days (constipation). Summary Pelvic floor dysfunction results when the muscles and connective tissues in your pelvic floor do not work well. These muscles and their connections form a sling that supports your colon and bladder. In women, they also support the uterus. PFD may be caused by an injury to the pelvic area or by a weakening of pelvic muscles. PFD causes pelvic floor muscles to be too weak, too tight, or a combination of both. Symptoms may vary from person to person. In most cases, PFD can be treated with physical therapies and medicines. Surgery may be an option if other treatments do not help. This information is not intended to replace advice given to you by your health care provider. Make sure you discuss any questions you have with your health care provider. Document Revised: 06/25/2020 Document Reviewed: 06/25/2020 Elsevier Patient Education  2022 [employer].  I appreciate the  opportunity to care for you  Thank You   [facility]

## 2022-11-20 NOTE — Progress Notes (Signed)
Agree with assessment and plan as outlined.

## 2022-11-22 ENCOUNTER — Telehealth: Payer: Self-pay | Admitting: Orthopedic Surgery

## 2022-11-22 NOTE — Telephone Encounter (Signed)
Pt is requesting an appt due to lower back pain. Pt is currently under restrictions for a procedure and is requesting service after this week. Pt best call back number [phone]

## 2022-11-25 ENCOUNTER — Other Ambulatory Visit: Payer: Self-pay | Admitting: Family Medicine

## 2022-11-25 ENCOUNTER — Ambulatory Visit: Payer: Medicare Other | Admitting: Gastroenterology

## 2022-11-25 ENCOUNTER — Encounter: Payer: Self-pay | Admitting: Gastroenterology

## 2022-11-25 VITALS — BP 131/69 | HR 62 | Temp 98.4°F | Resp 17 | Ht 67.0 in | Wt 208.0 lb

## 2022-11-25 DIAGNOSIS — K219 Gastro-esophageal reflux disease without esophagitis: Secondary | ICD-10-CM

## 2022-11-25 DIAGNOSIS — R194 Change in bowel habit: Secondary | ICD-10-CM

## 2022-11-25 DIAGNOSIS — D122 Benign neoplasm of ascending colon: Secondary | ICD-10-CM

## 2022-11-25 MED ORDER — SODIUM CHLORIDE 0.9 % IV SOLN: 500.0000 mL | Freq: Once | INTRAVENOUS | Status: DC

## 2022-11-25 NOTE — Op Note (Signed)
[facility] Patient Name: Jillian Guerrero Procedure Date: 11/25/2022 10:41 AM MRN: [id number] Endoscopist: Viviann Spare P. Adela Lank , MD, [id number] Age: 77 Referring MD:  Date of Birth: 1946-01-09 Gender: Female Account #: [id number] Procedure:                Colonoscopy Indications:              Change in bowel habits - chronic longstanding                            constipation, change in stool form recently with                            some episodes of incontinence. Patient has made a                            signfiicant dietary change recently which has                            helped, symptoms significantly improved / resolved Medicines:                Monitored Anesthesia Care Procedure:                Pre-Anesthesia Assessment:                           - Prior to the procedure, a History and Physical                            was performed, and patient medications and                            allergies were reviewed. The patient's tolerance of                            previous anesthesia was also reviewed. The risks                            and benefits of the procedure and the sedation                            options and risks were discussed with the patient.                            All questions were answered, and informed consent                            was obtained. Prior Anticoagulants: The patient has                            taken no anticoagulant or antiplatelet agents. ASA                            Grade Assessment: II - A patient with mild systemic  disease. After reviewing the risks and benefits,                            the patient was deemed in satisfactory condition to                            undergo the procedure.                           After obtaining informed consent, the colonoscope                            was passed under direct vision. Throughout the                            procedure, the  patient's blood pressure, pulse, and                            oxygen saturations were monitored continuously. The                            Olympus PCF-[id number] (#[id number]) Colonoscope was                            introduced through the anus and advanced to the the                            cecum, identified by appendiceal orifice and                            ileocecal valve. The colonoscopy was performed                            without difficulty. The patient tolerated the                            procedure well. The quality of the bowel                            preparation was adequate. The ileocecal valve,                            appendiceal orifice, and rectum were photographed. Scope In: 11:02:29 AM Scope Out: 11:21:25 AM Scope Withdrawal Time: 0 hours 14 minutes 2 seconds  Total Procedure Duration: 0 hours 18 minutes 56 seconds  Findings:                 The perianal and digital rectal examinations were                            normal.                           A 5 to 6 mm polyp was found in the ascending colon.  The polyp was sessile. The polyp was removed with a                            cold snare. Resection and retrieval were complete.                           A few small-mouthed diverticula were found in the                            sigmoid colon.                           Internal hemorrhoids were found during retroflexion.                           The exam was otherwise without abnormality. Complications:            No immediate complications. Estimated blood loss:                            Minimal. Estimated Blood Loss:     Estimated blood loss was minimal. Impression:               - One 5 to 6 mm polyp in the ascending colon,                            removed with a cold snare. Resected and retrieved.                           - Diverticulosis in the sigmoid colon.                           - Internal hemorrhoids.                            - The examination was otherwise normal. Recommendation:           - Patient has a contact number available for                            emergencies. The signs and symptoms of potential                            delayed complications were discussed with the                            patient. Return to normal activities tomorrow.                            Written discharge instructions were provided to the                            patient.                           - Resume previous diet.                           -  Continue present medications.                           - Await pathology results. Do not think the patient                            warrants any further surveillance exams given her                            age and no advanced lesions on this exam. Willaim Rayas. Adela Lank, MD 11/25/2022 11:26:24 AM This report has been signed electronically.

## 2022-11-25 NOTE — Progress Notes (Signed)
Called to room to assist during endoscopic procedure.  Patient ID and intended procedure confirmed with present staff. Received instructions for my participation in the procedure from the performing physician.  

## 2022-11-25 NOTE — Patient Instructions (Addendum)
Resume previous diet Continue present medications Await pathology results  Handouts/information given for polyps, diverticulosis and hemorrhoids  YOU HAD AN ENDOSCOPIC PROCEDURE TODAY AT THE [facility]:   Refer to the procedure report that was given to you for any specific questions about what was found during the examination.  If the procedure report does not answer your questions, please call your gastroenterologist to clarify.  If you requested that your care partner not be given the details of your procedure findings, then the procedure report has been included in a sealed envelope for you to review at your convenience later.  YOU SHOULD EXPECT: Some feelings of bloating in the abdomen. Passage of more gas than usual.  Walking can help get rid of the air that was put into your GI tract during the procedure and reduce the bloating. If you had a lower endoscopy (such as a colonoscopy or flexible sigmoidoscopy) you may notice spotting of blood in your stool or on the toilet paper. If you underwent a bowel prep for your procedure, you may not have a normal bowel movement for a few days.  Please Note:  You might notice some irritation and congestion in your nose or some drainage.  This is from the oxygen used during your procedure.  There is no need for concern and it should clear up in a day or so.  SYMPTOMS TO REPORT IMMEDIATELY:  Following lower endoscopy (colonoscopy):  Excessive amounts of blood in the stool  Significant tenderness or worsening of abdominal pains  Swelling of the abdomen that is new, acute  Fever of 100F or higher  For urgent or emergent issues, a gastroenterologist can be reached at any hour by calling [phone]. Do not use MyChart messaging for urgent concerns.   DIET:  We do recommend a small meal at first, but then you may proceed to your regular diet.  Drink plenty of fluids but you should avoid alcoholic beverages for 24 hours.  ACTIVITY:  You  should plan to take it easy for the rest of today and you should NOT DRIVE or use heavy machinery until tomorrow (because of the sedation medicines used during the test).    FOLLOW UP: Our staff will call the number listed on your records the next business day following your procedure.  We will call around 7:15- 8:00 am to check on you and address any questions or concerns that you may have regarding the information given to you following your procedure. If we do not reach you, we will leave a message.     If any biopsies were taken you will be contacted by phone or by letter within the next 1-3 weeks.  Please call us at [phone] if you have not heard about the biopsies in 3 weeks.    SIGNATURES/CONFIDENTIALITY: You and/or your care partner have signed paperwork which will be entered into your electronic medical record.  These signatures attest to the fact that that the information above on your After Visit Summary has been reviewed and is understood.  Full responsibility of the confidentiality of this discharge information lies with you and/or your care-partner. 

## 2022-11-25 NOTE — Progress Notes (Signed)
Pt's states no medical or surgical changes since previsit or office visit. 

## 2022-11-25 NOTE — Progress Notes (Signed)
History and Physical Interval Note: seen in the office 11/19/22 - change in bowel habits, worsening constipation. Some incontinence. Last colonoscopy 2015 reportedly normal. Colonoscopy to further evaluate. She states she stopped eating too many veggies since the last visit and her bowels have improved since then. Otherwise feels well without complaints. No interval changes. She wishes to proceed.    11/25/2022 10:49 AM  Jillian Guerrero  has presented today for endoscopic procedure(s), with the diagnosis of  Encounter Diagnosis  Name Primary?   Change in bowel habits Yes  .  The various methods of evaluation and treatment have been discussed with the patient and/or family. After consideration of risks, benefits and other options for treatment, the patient has consented to  the endoscopic procedure(s).   The patient's history has been reviewed, patient examined, no change in status, stable for surgery.  I have reviewed the patient's chart and labs.  Questions were answered to the patient's satisfaction.    Harlin Rain, MD [facility]

## 2022-11-25 NOTE — Progress Notes (Signed)
Sedate, gd SR, tolerated procedure well, VSS, report to RN 

## 2022-11-26 ENCOUNTER — Telehealth: Payer: Self-pay

## 2022-11-26 NOTE — Telephone Encounter (Signed)
  Follow up Call-     11/25/2022   10:12 AM  Call back number  Post procedure Call Back phone  # [phone]  Permission to leave phone message Yes     Patient questions:  Do you have a fever, pain , or abdominal swelling? No. Pain Score  0 *  Have you tolerated food without any problems? Yes.    Have you been able to return to your normal activities? Yes.    Do you have any questions about your discharge instructions: Diet   No. Medications  No. Follow up visit  No.  Do you have questions or concerns about your Care? No.  Actions: * If pain score is 4 or above: No action needed, pain <4.

## 2022-11-30 ENCOUNTER — Telehealth: Payer: Self-pay | Admitting: Orthopedic Surgery

## 2022-11-30 DIAGNOSIS — M5416 Radiculopathy, lumbar region: Secondary | ICD-10-CM

## 2022-11-30 LAB — SURGICAL PATHOLOGY

## 2022-11-30 NOTE — Telephone Encounter (Signed)
Spoke with patient and she is requesting another injection. She stated it is the same pain and the injection she received in 04/2022 lasted until 10-15 days ago. Please advise

## 2022-11-30 NOTE — Telephone Encounter (Signed)
Patient is here. She would like to schedule an appointment with Dr. Alvester Morin. She thought that she needed to see Dr. Christell Constant 1st. Now she would like to see [place] to schedule. Waiting for return from lunch.

## 2022-11-30 NOTE — Telephone Encounter (Signed)
Order for L1-2 interlam esi placed

## 2022-12-07 ENCOUNTER — Other Ambulatory Visit: Payer: Self-pay

## 2022-12-07 ENCOUNTER — Ambulatory Visit: Payer: Medicare Other | Admitting: Physical Medicine and Rehabilitation

## 2022-12-07 VITALS — BP 145/83 | HR 108

## 2022-12-07 DIAGNOSIS — M5416 Radiculopathy, lumbar region: Secondary | ICD-10-CM

## 2022-12-07 MED ORDER — METHYLPREDNISOLONE ACETATE 40 MG/ML IJ SUSP
40.0000 mg | Freq: Once | INTRAMUSCULAR | Status: AC
Start: 2022-12-07 — End: 2022-12-07
  Administered 2022-12-07: 40 mg

## 2022-12-07 NOTE — Patient Instructions (Signed)

## 2022-12-07 NOTE — Progress Notes (Signed)
Functional Pain Scale - descriptive words and definitions  Distressing (6)    Pain is present/unable to complete most ADLs limited by pain/sleep is difficult and active distraction is only marginal. Moderate range order  Average Pain  varies   +Driver, -BT, -Dye Allergies.  Lower back pain on right side that radiates into the right hip

## 2022-12-16 ENCOUNTER — Ambulatory Visit (INDEPENDENT_AMBULATORY_CARE_PROVIDER_SITE_OTHER): Payer: Medicare Other | Admitting: Orthopedic Surgery

## 2022-12-16 DIAGNOSIS — M4015 Other secondary kyphosis, thoracolumbar region: Secondary | ICD-10-CM | POA: Diagnosis not present

## 2022-12-16 NOTE — Progress Notes (Signed)
Orthopedic Spine Surgery Office Note   Assessment: Patient is a 77 y.o. female with low back pain who has an old L1 burst fracture with focal kyphosis above the fracture.      Plan: -Patient is no longer getting relief from injections  so I did not recommend doing any repeat spine injections -She is not interested in anything stronger than naproxen in terms of medication -We briefly talked about surgery. She has focal kyphosis through her old fracture so I talked about surgery. She is fearful of complications of surgery with hip and knee surgery that has been discussed with her. I told her that a surgery on her thoracolumbar spine would be higher risk for complication than those other surgeries. We talked about seeing a different surgeon to see if he/she felt there was another solution to her problem. A referral was provided to her to see Dr. Wardell Heath at [facility] -I talked about pain management with her as well as an option -Patient wanted to return after talking with Dr. Wardell Heath to go over things again and discuss how she would like to proceed. She should get scoliosis x-rays and a supine lateral lumbar x-ray at our next visit     Patient expressed understanding of the plan and all questions were answered to the patient's satisfaction.    ___________________________________________________________________________   History: Patient is a 77 y.o. female who has been previously seen in the office for low back pain.  Patient has a long history of low back pain that started after a fracture from standing height.  She had a kyphoplasty that was aborted due to intraoperative hypotension per patient report.  After one of our earlier visit, she got a lumbar steroid injection with Dr. Alvester Morin that provided her with significant relief for 7 months.  Her back pain then returned within the last month and was severe in nature.  She was able to get in and get another injection with Dr. Alvester Morin.  She said she did not  even get a week of relief with that injection.  Her pain is localized to the thoracolumbar junction and mid lumbar regions.  She has no pain radiating into the lower extremities.  She has no bowel or bladder incontinence.  No saddle anesthesia.   Previous treatments: aleve, tylenol, activity modification, lumbar injection   Physical Exam:     General: no acute distress, appears stated age Neurologic: alert, answering questions appropriately, following commands Respiratory: unlabored breathing on room air, symmetric chest rise Psychiatric: appropriate affect, normal cadence to speech     MSK (spine):   -Strength exam                                                   Left                  Right EHL                              5/5                  5/5 TA                                 5/5  5/5 GSC                             5/5                  5/5 Knee extension            5/5                  5/5 Hip flexion                    5/5                  5/5   -Sensory exam                           Sensation intact to light touch in L3-S1 nerve distributions of bilateral lower extremities   -Achilles DTR: 2/4 on the left, 2/4 on the right -Patellar tendon DTR: 2/4 on the left, 2/4 on the right   -Straight leg raise: negative bilaterally -Femoral nerve stretch test: negative bilaterally -Clonus: no beats bilaterally   Imaging: XR of the lumbar spine from 04/20/2022 was previously independently reviewed and interpreted, showing disc height loss with anterior osteophyte formation at L3/4.  L1 compression fracture with cement augmentation into the vertebral body.  Focal kyphosis above L1 with caudal migration of the T12 vertebrae into L1.  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   MRI of the lumbar spine from 03/07/2022 was previously independently reviewed and interpreted, showing L1 burst fracture with retropulsion.  No significant central,  lateral recess, foraminal stenosis.     Patient name: Jillian Guerrero Patient MRN: [id number] Date of visit: 12/16/22

## 2022-12-20 ENCOUNTER — Ambulatory Visit
Admission: EM | Admit: 2022-12-20 | Discharge: 2022-12-20 | Disposition: A | Discharge status: Home / Self Care | Payer: Medicare Other | Attending: Internal Medicine | Admitting: Internal Medicine

## 2022-12-20 DIAGNOSIS — R300 Dysuria: Secondary | ICD-10-CM | POA: Diagnosis present

## 2022-12-20 DIAGNOSIS — N3001 Acute cystitis with hematuria: Secondary | ICD-10-CM | POA: Diagnosis present

## 2022-12-20 LAB — POCT URINALYSIS DIP (MANUAL ENTRY)
Bilirubin, UA: NEGATIVE
Glucose, UA: NEGATIVE mg/dL
Ketones, POC UA: NEGATIVE mg/dL
Nitrite, UA: NEGATIVE
Protein Ur, POC: NEGATIVE mg/dL
Spec Grav, UA: >=1.03 — AB (ref 1.010–1.025)
Urobilinogen, UA: 0.2 U/dL
pH, UA: 5.5 (ref 5.0–8.0)

## 2022-12-20 MED ORDER — CEPHALEXIN 500 MG PO CAPS: 500.0000 mg | ORAL_CAPSULE | Freq: Three times a day (TID) | ORAL | 0 refills | Status: DC

## 2022-12-20 NOTE — ED Triage Notes (Signed)
Pt c/o urinary freq, dysuria x today-NAD-steady gait

## 2022-12-20 NOTE — Progress Notes (Signed)
Jillian Guerrero - 77 y.o. female MRN [id number]  Date of birth: 08/23/1945  Office Visit Note: Visit Date: 12/07/2022 PCP: Karie Georges, MD Referred by: London Sheer, MD  Subjective: Chief Complaint  Patient presents with   Lower Back - Pain   HPI:  Jillian Guerrero is a 77 y.o. female who comes in today at the request of Dr. Willia Craze for planned Right L1-2 Lumbar Interlaminar epidural steroid injection with fluoroscopic guidance.  The patient has failed conservative care including home exercise, medications, time and activity modification.  This injection will be diagnostic and hopefully therapeutic.  Please see requesting physician notes for further details and justification.   ROS Otherwise per HPI.  Assessment & Plan: Visit Diagnoses:    ICD-10-CM   1. Lumbar radiculopathy  M54.16 XR C-ARM NO REPORT    Epidural Steroid injection    methylPREDNISolone acetate (DEPO-MEDROL) injection 40 mg      Plan: No additional findings.   Meds & Orders:  Meds ordered this encounter  Medications   methylPREDNISolone acetate (DEPO-MEDROL) injection 40 mg    Orders Placed This Encounter  Procedures   XR C-ARM NO REPORT   Epidural Steroid injection    Follow-up: Return for visit to requesting provider as needed.   Procedures: No procedures performed  Lumbar Epidural Steroid Injection - Interlaminar Approach with Fluoroscopic Guidance  Patient: Jillian Guerrero      Date of Birth: June 18, 1945 MRN: [id number] PCP: Karie Georges, MD      Visit Date: 12/07/2022   Universal Protocol:     Consent Given By: the patient  Position: PRONE  Additional Comments: Vital signs were monitored before and after the procedure. Patient was prepped and draped in the usual sterile fashion. The correct patient, procedure, and site was verified.   Injection Procedure Details:   Procedure diagnoses: Lumbar radiculopathy [M54.16]   Meds Administered:  Meds ordered this  encounter  Medications   methylPREDNISolone acetate (DEPO-MEDROL) injection 40 mg     Laterality: Right  Location/Site:  L1-2  Needle: 3.5 in., 20 ga. Tuohy  Needle Placement: Paramedian epidural  Findings:   -Comments: Excellent flow of contrast into the epidural space.  Procedure Details: Using a paramedian approach from the side mentioned above, the region overlying the inferior lamina was localized under fluoroscopic visualization and the soft tissues overlying this structure were infiltrated with 4 ml. of 1% Lidocaine without Epinephrine. The Tuohy needle was inserted into the epidural space using a paramedian approach.   The epidural space was localized using loss of resistance along with counter oblique bi-planar fluoroscopic views.  After negative aspirate for air, blood, and CSF, a 2 ml. volume of Isovue-250 was injected into the epidural space and the flow of contrast was observed. Radiographs were obtained for documentation purposes.    The injectate was administered into the level noted above.   Additional Comments:  No complications occurred Dressing: 2 x 2 sterile gauze and Band-Aid    Post-procedure details: Patient was observed during the procedure. Post-procedure instructions were reviewed.  Patient left the clinic in stable condition.   Clinical History: MRI LUMBAR SPINE WITHOUT CONTRAST   TECHNIQUE: Multiplanar, multisequence MR imaging of the lumbar spine was performed. No intravenous contrast was administered.   COMPARISON:  10/20/2020   FINDINGS: Segmentation:  Standard.   Alignment:  Physiologic.   Vertebrae: No acute fracture, evidence of discitis, or aggressive bone lesion. L1 vertebral body compression fracture unchanged compared with 10/20/2020  with 50% height loss and mild marrow edema along the superior posterior corner of the L1 vertebral body. 4 mm retropulsion of the superior posterior margin of the L1 vertebral body mildly  impressing upon the thecal sac and resulting in mild spinal stenosis. Benign hemangioma in the L4 vertebral body.   Conus medullaris and cauda equina: Conus extends to the L1 level. Conus and cauda equina appear normal.   Paraspinal and other soft tissues: No acute paraspinal abnormality.   Disc levels:   Disc spaces: Disc desiccation throughout the lumbar spine. Mild disc height loss at L3-4.   T11-12: Mild broad-based disc bulge. Moderate right and mild left facet arthropathy. No foraminal or central canal stenosis.   T12-L1: Broad-based disc bulge. Mild bilateral facet arthropathy. No foraminal or central canal stenosis.   L1-L2: Mild broad-based disc bulge. Mild bilateral facet arthropathy. Mild bilateral foraminal stenosis.   L2-L3: Mild broad-based disc bulge. Mild bilateral facet arthropathy. No foraminal or central canal stenosis.   L3-L4: Mild broad-based disc bulge. Moderate bilateral facet arthropathy. Mild spinal stenosis. Bilateral lateral recess stenosis. Mild bilateral foraminal stenosis.   L4-L5: Mild broad-based disc bulge. Mild bilateral facet arthropathy. No foraminal or central canal stenosis.   L5-S1: Mild broad-based disc bulge. Moderate bilateral facet arthropathy. No foraminal or central canal stenosis.   IMPRESSION: 1. L1 vertebral body compression fracture unchanged compared with 10/20/2020 with 50% height loss and mild residual marrow edema along the superior posterior corner of the L1 vertebral body. 4 mm retropulsion of the superior posterior margin of the L1 vertebral body mildly impressing upon the thecal sac and resulting in mild spinal stenosis. 2. Lumbar spine spondylosis as described above.     Electronically Signed   By: Elige Ko M.D.   On: 03/08/2022 15:34     Objective:  VS:  HT:    WT:   BMI:     BP:(!) 145/83  HR:(!) 108bpm  TEMP: ( )  RESP:  Physical Exam Vitals and nursing note reviewed.  Constitutional:       General: She is not in acute distress.    Appearance: Normal appearance. She is not ill-appearing.  HENT:     Head: Normocephalic and atraumatic.     Right Ear: External ear normal.     Left Ear: External ear normal.  Eyes:     Extraocular Movements: Extraocular movements intact.  Cardiovascular:     Rate and Rhythm: Normal rate.     Pulses: Normal pulses.  Pulmonary:     Effort: Pulmonary effort is normal. No respiratory distress.  Abdominal:     General: There is no distension.     Palpations: Abdomen is soft.  Musculoskeletal:        General: Tenderness present.     Cervical back: Neck supple.     Right lower leg: No edema.     Left lower leg: No edema.     Comments: Patient has good distal strength with no pain over the greater trochanters.  No clonus or focal weakness.  Skin:    Findings: No erythema, lesion or rash.  Neurological:     General: No focal deficit present.     Mental Status: She is alert and oriented to person, place, and time.     Sensory: No sensory deficit.     Motor: No weakness or abnormal muscle tone.     Coordination: Coordination normal.  Psychiatric:        Mood and Affect: Mood normal.  Behavior: Behavior normal.      Imaging: No results found.

## 2022-12-20 NOTE — Procedures (Signed)
Lumbar Epidural Steroid Injection - Interlaminar Approach with Fluoroscopic Guidance  Patient: Jillian Guerrero      Date of Birth: [birth year] MRN: [id number] PCP: Karie Georges, MD      Visit Date: 12/07/2022   Universal Protocol:     Consent Given By: the patient  Position: PRONE  Additional Comments: Vital signs were monitored before and after the procedure. Patient was prepped and draped in the usual sterile fashion. The correct patient, procedure, and site was verified.   Injection Procedure Details:   Procedure diagnoses: Lumbar radiculopathy [M54.16]   Meds Administered:  Meds ordered this encounter  Medications   methylPREDNISolone acetate (DEPO-MEDROL) injection 40 mg     Laterality: Right  Location/Site:  L1-2  Needle: 3.5 in., 20 ga. Tuohy  Needle Placement: Paramedian epidural  Findings:   -Comments: Excellent flow of contrast into the epidural space.  Procedure Details: Using a paramedian approach from the side mentioned above, the region overlying the inferior lamina was localized under fluoroscopic visualization and the soft tissues overlying this structure were infiltrated with 4 ml. of 1% Lidocaine without Epinephrine. The Tuohy needle was inserted into the epidural space using a paramedian approach.   The epidural space was localized using loss of resistance along with counter oblique bi-planar fluoroscopic views.  After negative aspirate for air, blood, and CSF, a 2 ml. volume of Isovue-250 was injected into the epidural space and the flow of contrast was observed. Radiographs were obtained for documentation purposes.    The injectate was administered into the level noted above.   Additional Comments:  No complications occurred Dressing: 2 x 2 sterile gauze and Band-Aid    Post-procedure details: Patient was observed during the procedure. Post-procedure instructions were reviewed.  Patient left the clinic in stable condition.

## 2022-12-20 NOTE — ED Provider Notes (Signed)
[facility]  Note:  This document was prepared using Conservation officer, historic buildings and may include unintentional dictation errors.  MRN: [id number] DOB: 08-17-1945  Subjective:   Jillian Guerrero is a 77 y.o. female presenting for 1 day history of acute onset persistent dysuria, urinary frequency and urinary urgency.  Has had a history of frequent UTIs for the past 6 months.  Has not seen any urologist.   Current Facility-Administered Medications:    methylPREDNISolone acetate (DEPO-MEDROL) injection 40 mg, 40 mg, Other, Once,   Current Outpatient Medications:    alendronate (FOSAMAX) 70 MG tablet, TAKE 1 TABLET EVERY 7 DAYS - TAKE WITH A FULL GLASS OF WATER ON AN EMPTY STOMACH., Disp: 12 tablet, Rfl: 3   atorvastatin (LIPITOR) 10 MG tablet, TAKE 1 TABLET EVERY DAY, Disp: 90 tablet, Rfl: 0   calcium citrate-vitamin D (CITRACAL+D) 315-200 MG-UNIT tablet, Take 1 tablet by mouth 2 (two) times daily., Disp: , Rfl:    cetirizine (ZYRTEC) 10 MG tablet, Take 10 mg by mouth daily., Disp: , Rfl:    gabapentin (NEURONTIN) 600 MG tablet, Take 1 tablet (600 mg total) by mouth 2 (two) times daily., Disp: 180 tablet, Rfl: 1   pantoprazole (PROTONIX) 40 MG tablet, TAKE 1 TABLET EVERY DAY, Disp: 90 tablet, Rfl: 0   tiZANidine (ZANAFLEX) 4 MG tablet, TAKE 1 TABLET AT BEDTIME, Disp: 90 tablet, Rfl: 3   valACYclovir (VALTREX) 1000 MG tablet, TAKE 1 TABLET EVERY DAY, Disp: 90 tablet, Rfl: 0   Allergies  Allergen Reactions   Ciprofloxacin Swelling   Collagen     Redness, swelling    Past Medical History:  Diagnosis Date   Asthma    GERD (gastroesophageal reflux disease)    Hyperlipidemia    Lumbar compression fracture (HCC)    Macular degeneration, wet (HCC)    both eyes   Osteopenia    Osteoporosis    Peripheral neuropathy      Past Surgical History:  Procedure Laterality Date   APPENDECTOMY  1956   BLADDER SUSPENSION  2003   BREAST BIOPSY  2015   hyaluronic  acid viscosupplementation Bilateral    knees - 01/16/2018 x2   IR RADIOLOGIST EVAL & MGMT  01/21/2021   KYPHOPLASTY  10/2018   TONSILLECTOMY  1964   UPPER GI ENDOSCOPY  03/08/2019   gastritis    Family History  Problem Relation Age of Onset   Arthritis Mother    Heart disease Mother    Arthritis Father    Depression Father    Arthritis Brother    High blood pressure Brother    High Cholesterol Brother    Arthritis Maternal Grandfather    Asthma Paternal Grandmother    Depression Paternal Grandmother    Heart disease Paternal Grandmother    High Cholesterol Paternal Grandmother    Arthritis Daughter    Arthritis Son    Heart attack Son    High Cholesterol Son    Colon cancer Neg Hx    Esophageal cancer Neg Hx    Rectal cancer Neg Hx     Social History   Tobacco Use   Smoking status: Former    Current packs/day: 1.00    Average packs/day: 1 pack/day for 40.0 years (40.0 ttl pk-yrs)    Types: Cigarettes   Smokeless tobacco: Never   Tobacco comments:    Quit in 2002  Vaping Use   Vaping status: Never Used  Substance Use Topics   Alcohol use:  Never   Drug use: Never    ROS   Objective:   Vitals: BP 138/82 (BP Location: Right Arm)   Pulse 82   Temp 97.7 F (36.5 C) (Oral)   Resp 20   SpO2 94%   Physical Exam Constitutional:      General: She is not in acute distress.    Appearance: Normal appearance. She is well-developed. She is not ill-appearing, toxic-appearing or diaphoretic.  HENT:     Head: Normocephalic and atraumatic.     Nose: Nose normal.     Mouth/Throat:     Mouth: Mucous membranes are moist.     Pharynx: Oropharynx is clear.  Eyes:     General: No scleral icterus.       Right eye: No discharge.        Left eye: No discharge.     Extraocular Movements: Extraocular movements intact.     Conjunctiva/sclera: Conjunctivae normal.  Cardiovascular:     Rate and Rhythm: Normal rate.  Pulmonary:     Effort: Pulmonary effort is normal.   Abdominal:     General: Bowel sounds are normal. There is no distension.     Palpations: Abdomen is soft. There is no mass.     Tenderness: There is no abdominal tenderness. There is no right CVA tenderness, left CVA tenderness, guarding or rebound.  Skin:    General: Skin is warm and dry.  Neurological:     General: No focal deficit present.     Mental Status: She is alert and oriented to person, place, and time.  Psychiatric:        Mood and Affect: Mood normal.        Behavior: Behavior normal.        Thought Content: Thought content normal.        Judgment: Judgment normal.     Results for orders placed or performed during the hospital encounter of 12/20/22 (from the past 24 hour(s))  POCT urinalysis dipstick     Status: Abnormal   Collection Time: 12/20/22  2:09 PM  Result Value Ref Range   Color, UA yellow yellow   Clarity, UA cloudy (A) clear   Glucose, UA negative negative mg/dL   Bilirubin, UA negative negative   Ketones, POC UA negative negative mg/dL   Spec Grav, UA >=2.440 (A) 1.010 - 1.025   Blood, UA moderate (A) negative   pH, UA 5.5 5.0 - 8.0   Protein Ur, POC negative negative mg/dL   Urobilinogen, UA 0.2 0.2 or 1.0 E.U./dL   Nitrite, UA Negative Negative   Leukocytes, UA Moderate (2+) (A) Negative    Assessment and Plan :   PDMP not reviewed this encounter.  1. Acute cystitis with hematuria   2. Dysuria    Start Keflex to cover for acute cystitis, urine culture pending.  Recommended aggressive hydration, limiting urinary irritants. Counseled patient on potential for adverse effects with medications prescribed/recommended today, ER and return-to-clinic precautions discussed, patient verbalized understanding.    Wallis Bamberg, PA-C 12/20/22 1429

## 2022-12-20 NOTE — Discharge Instructions (Addendum)
Please start Keflex (cephalexin) to address an urinary tract infection. Make sure you hydrate very well with plain water and a quantity of 80 ounces of water a day.  Please limit drinks that are considered urinary irritants such as soda, sweet tea, coffee, energy drinks, alcohol.  These can worsen your urinary and genital symptoms but also be the source of them.  I will let you know about your urine culture results through MyChart to see if we need to prescribe or change your antibiotics based off of those results.  Please contact [facility] for your frequent UTIs.

## 2022-12-22 LAB — URINE CULTURE: Culture: >=100000 — AB

## 2022-12-27 ENCOUNTER — Other Ambulatory Visit: Payer: Self-pay | Admitting: Family Medicine

## 2022-12-27 DIAGNOSIS — E782 Mixed hyperlipidemia: Secondary | ICD-10-CM

## 2022-12-31 ENCOUNTER — Other Ambulatory Visit: Payer: Self-pay | Admitting: *Deleted

## 2022-12-31 DIAGNOSIS — I824Y1 Acute embolism and thrombosis of unspecified deep veins of right proximal lower extremity: Secondary | ICD-10-CM

## 2022-12-31 DIAGNOSIS — I83813 Varicose veins of bilateral lower extremities with pain: Secondary | ICD-10-CM

## 2023-01-04 NOTE — Progress Notes (Unsigned)
Office Note    HPI: Jillian Guerrero is a 77 y.o. (1945-09-20) female who presents in follow-up with known venous insufficiency CEAP 4.  She was a previous patient of my partner Dr. Edilia Bo and was noted to have right sided swelling with significant pain from the knee down.  DVT negative, significant superficial venous reflux.  At her last visit, she was doing much better, and noted significant improvement with compression stockings.  There was a discussion regarding laser ablation with plans for 80-month follow-up to discuss the procedure further should the right leg remains symptomatic.  Patient also has history of AAA, which was last imaged in 19-Jan-2020 at 3.5 cm.   On exam today, [name] was doing well.  She had no complaints.  Originally from [place], [place]  Her husband lived in [place] for years where he owned an Psychiatric nurse.  She moved to [place] to be closer to the one of her daughters.  Her other 3 children live in [place].  Her husband passed away in 18-Jan-2018 in his sleep.    Regarding her right lower extremity pain -she has been asymptomatic for a number of months.  She has not been wearing her compression stockings, but stated she would start wearing them again should the right lower extremity pain or swelling recur.  Denies significant heaviness.  No significant varicosities, ulceration.  Some telangiectasias unchanged   No history of DVT, no history of venous procedures. Denies symptoms of claudication, ischemic rest pain, tissue loss.   Past Medical History:  Diagnosis Date   Asthma    GERD (gastroesophageal reflux disease)    Hyperlipidemia    Lumbar compression fracture (HCC)    Macular degeneration, wet (HCC)    both eyes   Osteopenia    Osteoporosis    Peripheral neuropathy     Past Surgical History:  Procedure Laterality Date   APPENDECTOMY  1956   BLADDER SUSPENSION  18-Jan-2002   BREAST BIOPSY  2015   hyaluronic acid  viscosupplementation Bilateral    knees - 01/16/2018 x2   IR RADIOLOGIST EVAL & MGMT  01/21/2021   KYPHOPLASTY  10/2018   TONSILLECTOMY  1964   UPPER GI ENDOSCOPY  03/08/2019   gastritis    Social History   Socioeconomic History   Marital status: Widowed    Spouse name: Not on file   Number of children: 2   Years of education: Not on file   Highest education level: 12th grade  Occupational History   Occupation: retired  Tobacco Use   Smoking status: Former    Current packs/day: 1.00    Average packs/day: 1 pack/day for 40.0 years (40.0 ttl pk-yrs)    Types: Cigarettes   Smokeless tobacco: Never   Tobacco comments:    Quit in Jan 18, 2001  Vaping Use   Vaping status: Never Used  Substance and Sexual Activity   Alcohol use: Never   Drug use: Never   Sexual activity: Not on file  Other Topics Concern   Not on file  Social History Narrative   Right handed    Lives alone    Social Determinants of Health   Financial Resource Strain: Low Risk  (10/20/2022)   Overall Financial Resource Strain (CARDIA)    Difficulty of Paying Living Expenses: Not hard at all  Food Insecurity: No Food Insecurity (10/20/2022)   Hunger Vital Sign    Worried About Running Out of Food in the Last  Year: Never true    Ran Out of Food in the Last Year: Never true  Transportation Needs: No Transportation Needs (10/20/2022)   PRAPARE - Administrator, Civil Service (Medical): No    Lack of Transportation (Non-Medical): No  Physical Activity: Inactive (10/20/2022)   Exercise Vital Sign    Days of Exercise per Week: 0 days    Minutes of Exercise per Session: 0 min  Stress: No Stress Concern Present (10/20/2022)   [employer] of Occupational Health - Occupational Stress Questionnaire    Feeling of Stress : Not at all  Social Connections: Moderately Integrated (10/20/2022)   Social Connection and Isolation Panel [NHANES]    Frequency of Communication with Friends and Family: More than three  times a week    Frequency of Social Gatherings with Friends and Family: More than three times a week    Attends Religious Services: More than 4 times per year    Active Member of [employer] or Organizations: Yes    Attends Banker Meetings: More than 4 times per year    Marital Status: Widowed  Intimate Partner Violence: Not At Risk (10/20/2022)   Humiliation, Afraid, Rape, and Kick questionnaire    Fear of Current or Ex-Partner: No    Emotionally Abused: No    Physically Abused: No    Sexually Abused: No   Family History  Problem Relation Age of Onset   Arthritis Mother    Heart disease Mother    Arthritis Father    Depression Father    Arthritis Brother    High blood pressure Brother    High Cholesterol Brother    Arthritis Maternal Grandfather    Asthma Paternal Grandmother    Depression Paternal Grandmother    Heart disease Paternal Grandmother    High Cholesterol Paternal Grandmother    Arthritis Daughter    Arthritis Son    Heart attack Son    High Cholesterol Son    Colon cancer Neg Hx    Esophageal cancer Neg Hx    Rectal cancer Neg Hx     Current Outpatient Medications  Medication Sig Dispense Refill   alendronate (FOSAMAX) 70 MG tablet TAKE 1 TABLET EVERY 7 DAYS - TAKE WITH A FULL GLASS OF WATER ON AN EMPTY STOMACH. 12 tablet 3   atorvastatin (LIPITOR) 10 MG tablet TAKE 1 TABLET EVERY DAY (NEED MD APPOINTMENT) 90 tablet 0   calcium citrate-vitamin D (CITRACAL+D) 315-200 MG-UNIT tablet Take 1 tablet by mouth 2 (two) times daily.     cephALEXin (KEFLEX) 500 MG capsule Take 1 capsule (500 mg total) by mouth 3 (three) times daily. 21 capsule 0   cetirizine (ZYRTEC) 10 MG tablet Take 10 mg by mouth daily.     gabapentin (NEURONTIN) 600 MG tablet Take 1 tablet (600 mg total) by mouth 2 (two) times daily. 180 tablet 1   pantoprazole (PROTONIX) 40 MG tablet TAKE 1 TABLET EVERY DAY 90 tablet 0   tiZANidine (ZANAFLEX) 4 MG tablet TAKE 1 TABLET AT BEDTIME 90 tablet  3   valACYclovir (VALTREX) 1000 MG tablet TAKE 1 TABLET EVERY DAY 90 tablet 0   No current facility-administered medications for this visit.    Allergies  Allergen Reactions   Ciprofloxacin Swelling   Collagen     Redness, swelling     REVIEW OF SYSTEMS:  [X]  denotes positive finding, [ ]  denotes negative finding Cardiac  Comments:  Chest pain or chest pressure:    Shortness  of breath upon exertion:    Short of breath when lying flat:    Irregular heart rhythm:        Vascular    Pain in calf, thigh, or hip brought on by ambulation:    Pain in feet at night that wakes you up from your sleep:     Blood clot in your veins:    Leg swelling:         Pulmonary    Oxygen at home:    Productive cough:     Wheezing:         Neurologic    Sudden weakness in arms or legs:     Sudden numbness in arms or legs:     Sudden onset of difficulty speaking or slurred speech:    Temporary loss of vision in one eye:     Problems with dizziness:         Gastrointestinal    Blood in stool:     Vomited blood:         Genitourinary    Burning when urinating:     Blood in urine:        Psychiatric    Major depression:         Hematologic    Bleeding problems:    Problems with blood clotting too easily:        Skin    Rashes or ulcers:        Constitutional    Fever or chills:      PHYSICAL EXAMINATION:  There were no vitals filed for this visit.  General:  WDWN in NAD; vital signs documented above Gait: Not observed HENT: WNL, normocephalic Pulmonary: normal non-labored breathing , without Rales, rhonchi,  wheezing Cardiac: regular HR Abdomen: soft, NT, no masses Skin: without rashes Vascular Exam/Pulses:  Right Left  Radial 2+ (normal) 2+ (normal)  Ulnar    Femoral    Popliteal    DP 2+ (normal) 2+ (normal)  PT     Extremities: without ischemic changes, without Gangrene , without cellulitis; without open wounds;  Musculoskeletal: no muscle wasting or  atrophy  Neurologic: A&O X 3;  No focal weakness or paresthesias are detected Psychiatric:  The pt has Normal affect.   Non-Invasive Vascular Imaging:   No DVT, cyst appreciated in the popliteal space.    ASSESSMENT/PLAN:: 77 y.o. female presenting with asymptomatic chronic venous insufficiency.  She has had minimal swelling, and is no longer wearing her compression stockings on a regular basis.  We had a long discussion regarding the importance of continuing compression stockings when able.  Being that she is asymptomatic, I do not think she would have significant benefit with greater saphenous vein ablation at this time.  Regarding her AAA, it was last measured in 2021 at 3.5 cm.  She would benefit from repeat aortic duplex ultrasound.  She remains asymptomatic at this time. My plan is to call her in the next month with her results.   Victorino Sparrow, MD [facility] [phone]

## 2023-01-05 ENCOUNTER — Ambulatory Visit: Payer: Medicare Other | Admitting: Adult Health

## 2023-01-06 ENCOUNTER — Encounter: Payer: Self-pay | Admitting: Vascular Surgery

## 2023-01-06 ENCOUNTER — Ambulatory Visit: Payer: Medicare Other | Admitting: Vascular Surgery

## 2023-01-06 ENCOUNTER — Ambulatory Visit (HOSPITAL_COMMUNITY)
Admission: RE | Admit: 2023-01-06 | Discharge: 2023-01-06 | Disposition: A | Discharge status: Home / Self Care | Payer: Medicare Other | Source: Ambulatory Visit | Attending: Vascular Surgery | Admitting: Vascular Surgery

## 2023-01-06 VITALS — BP 113/69 | HR 80 | Temp 97.7°F | Resp 20 | Ht 66.25 in | Wt 202.0 lb

## 2023-01-06 DIAGNOSIS — I872 Venous insufficiency (chronic) (peripheral): Secondary | ICD-10-CM

## 2023-01-06 DIAGNOSIS — I714 Abdominal aortic aneurysm, without rupture, unspecified: Secondary | ICD-10-CM | POA: Diagnosis not present

## 2023-01-06 DIAGNOSIS — M7121 Synovial cyst of popliteal space [Baker], right knee: Secondary | ICD-10-CM | POA: Diagnosis not present

## 2023-01-06 DIAGNOSIS — I83813 Varicose veins of bilateral lower extremities with pain: Secondary | ICD-10-CM | POA: Diagnosis present

## 2023-01-06 DIAGNOSIS — I824Y1 Acute embolism and thrombosis of unspecified deep veins of right proximal lower extremity: Secondary | ICD-10-CM | POA: Diagnosis present

## 2023-01-20 ENCOUNTER — Other Ambulatory Visit: Payer: Self-pay

## 2023-01-20 DIAGNOSIS — I714 Abdominal aortic aneurysm, without rupture, unspecified: Secondary | ICD-10-CM

## 2023-01-21 ENCOUNTER — Other Ambulatory Visit: Payer: Self-pay | Admitting: Family Medicine

## 2023-01-21 DIAGNOSIS — B029 Zoster without complications: Secondary | ICD-10-CM

## 2023-01-26 ENCOUNTER — Ambulatory Visit
Admission: EM | Admit: 2023-01-26 | Discharge: 2023-01-26 | Disposition: A | Discharge status: Home / Self Care | Payer: Medicare Other | Attending: Internal Medicine | Admitting: Internal Medicine

## 2023-01-26 ENCOUNTER — Encounter: Payer: Self-pay | Admitting: Emergency Medicine

## 2023-01-26 DIAGNOSIS — N3001 Acute cystitis with hematuria: Secondary | ICD-10-CM | POA: Diagnosis present

## 2023-01-26 LAB — POCT URINALYSIS DIP (MANUAL ENTRY)
Bilirubin, UA: NEGATIVE
Glucose, UA: NEGATIVE mg/dL
Ketones, POC UA: NEGATIVE mg/dL
Nitrite, UA: NEGATIVE
Protein Ur, POC: 100 mg/dL — AB
Spec Grav, UA: >=1.03 — AB (ref 1.010–1.025)
Urobilinogen, UA: 0.2 U/dL
pH, UA: 5.5 (ref 5.0–8.0)

## 2023-01-26 MED ORDER — PHENAZOPYRIDINE HCL 200 MG PO TABS: 200.0000 mg | ORAL_TABLET | Freq: Three times a day (TID) | ORAL | 0 refills | Status: DC | PRN

## 2023-01-26 MED ORDER — NITROFURANTOIN MONOHYD MACRO 100 MG PO CAPS: 100.0000 mg | ORAL_CAPSULE | Freq: Two times a day (BID) | ORAL | 0 refills | Status: DC

## 2023-01-26 NOTE — ED Provider Notes (Signed)
[facility]  Note:  This document was prepared using Conservation officer, historic buildings and may include unintentional dictation errors.  MRN: [id number] DOB: 19-Aug-1945  Subjective:   Jillian Guerrero is a 77 y.o. female presenting for 1 day history of acute onset recurrent dysuria, urinary frequency and urgency.  Patient is in the process of obtaining a referral to a urogynecologist.  She tried to establish with an urologist but was not within her network and therefore has had to wait.  No fever, nausea, vomiting, flank pain.  Hydrates well.  No current facility-administered medications for this encounter.  Current Outpatient Medications:    alendronate (FOSAMAX) 70 MG tablet, TAKE 1 TABLET EVERY 7 DAYS - TAKE WITH A FULL GLASS OF WATER ON AN EMPTY STOMACH., Disp: 12 tablet, Rfl: 3   atorvastatin (LIPITOR) 10 MG tablet, TAKE 1 TABLET EVERY DAY (NEED MD APPOINTMENT), Disp: 90 tablet, Rfl: 0   calcium citrate-vitamin D (CITRACAL+D) 315-200 MG-UNIT tablet, Take 1 tablet by mouth 2 (two) times daily., Disp: , Rfl:    cephALEXin (KEFLEX) 500 MG capsule, Take 1 capsule (500 mg total) by mouth 3 (three) times daily., Disp: 21 capsule, Rfl: 0   cetirizine (ZYRTEC) 10 MG tablet, Take 10 mg by mouth daily., Disp: , Rfl:    gabapentin (NEURONTIN) 600 MG tablet, Take 1 tablet (600 mg total) by mouth 2 (two) times daily., Disp: 180 tablet, Rfl: 1   pantoprazole (PROTONIX) 40 MG tablet, TAKE 1 TABLET EVERY DAY, Disp: 90 tablet, Rfl: 0   tiZANidine (ZANAFLEX) 4 MG tablet, TAKE 1 TABLET AT BEDTIME, Disp: 90 tablet, Rfl: 3   valACYclovir (VALTREX) 1000 MG tablet, TAKE 1 TABLET EVERY DAY (NEED MD APPOINTMENT), Disp: 90 tablet, Rfl: 0   Allergies  Allergen Reactions   Ciprofloxacin Swelling   Collagen     Redness, swelling    Past Medical History:  Diagnosis Date   Asthma    GERD (gastroesophageal reflux disease)    Hyperlipidemia    Lumbar compression fracture (HCC)     Macular degeneration, wet (HCC)    both eyes   Osteopenia    Osteoporosis    Peripheral neuropathy      Past Surgical History:  Procedure Laterality Date   APPENDECTOMY  1956   BLADDER SUSPENSION  2003   BREAST BIOPSY  2015   hyaluronic acid viscosupplementation Bilateral    knees - 01/16/2018 x2   IR RADIOLOGIST EVAL & MGMT  01/21/2021   KYPHOPLASTY  10/2018   TONSILLECTOMY  1964   UPPER GI ENDOSCOPY  03/08/2019   gastritis    Family History  Problem Relation Age of Onset   Arthritis Mother    Heart disease Mother    Arthritis Father    Depression Father    Arthritis Brother    High blood pressure Brother    High Cholesterol Brother    Arthritis Maternal Grandfather    Asthma Paternal Grandmother    Depression Paternal Grandmother    Heart disease Paternal Grandmother    High Cholesterol Paternal Grandmother    Arthritis Daughter    Arthritis Son    Heart attack Son    High Cholesterol Son    Colon cancer Neg Hx    Esophageal cancer Neg Hx    Rectal cancer Neg Hx     Social History   Tobacco Use   Smoking status: Former    Current packs/day: 1.00    Average packs/day: 1 pack/day for  40.0 years (40.0 ttl pk-yrs)    Types: Cigarettes   Smokeless tobacco: Never   Tobacco comments:    Quit in 2002  Vaping Use   Vaping status: Never Used  Substance Use Topics   Alcohol use: Never   Drug use: Never    ROS   Objective:   Vitals: BP 120/81 (BP Location: Right Arm)   Pulse 76   Temp (!) 97.5 F (36.4 C) (Oral)   Resp 16   SpO2 93%   Physical Exam Constitutional:      General: She is not in acute distress.    Appearance: Normal appearance. She is well-developed. She is not ill-appearing, toxic-appearing or diaphoretic.  HENT:     Head: Normocephalic and atraumatic.     Nose: Nose normal.     Mouth/Throat:     Mouth: Mucous membranes are moist.  Eyes:     General: No scleral icterus.       Right eye: No discharge.        Left eye: No  discharge.     Extraocular Movements: Extraocular movements intact.  Cardiovascular:     Rate and Rhythm: Normal rate.  Pulmonary:     Effort: Pulmonary effort is normal.  Skin:    General: Skin is warm and dry.  Neurological:     General: No focal deficit present.     Mental Status: She is alert and oriented to person, place, and time.  Psychiatric:        Mood and Affect: Mood normal.        Behavior: Behavior normal.     Results for orders placed or performed during the hospital encounter of 01/26/23 (from the past 24 hour(s))  POCT urinalysis dipstick     Status: Abnormal   Collection Time: 01/26/23  2:00 PM  Result Value Ref Range   Color, UA yellow yellow   Clarity, UA cloudy (A) clear   Glucose, UA negative negative mg/dL   Bilirubin, UA negative negative   Ketones, POC UA negative negative mg/dL   Spec Grav, UA >=7.829 (A) 1.010 - 1.025   Blood, UA large (A) negative   pH, UA 5.5 5.0 - 8.0   Protein Ur, POC =100 (A) negative mg/dL   Urobilinogen, UA 0.2 0.2 or 1.0 E.U./dL   Nitrite, UA Negative Negative   Leukocytes, UA Small (1+) (A) Negative   Recent Results (from the past 2160 hour(s))  Surgical pathology ([facility])     Status: None   Collection Time: 11/25/22 12:00 AM  Result Value Ref Range   SURGICAL PATHOLOGY      SURGICAL PATHOLOGY [facility] [address], [address] [place], [place] [address] Telephone [phone] or [phone] Fax [phone]  REPORT OF SURGICAL PATHOLOGY   Accession #: [id number] Patient Name: PATTIE, STINE Visit # : [id number]  MRN: [id number] Physician: Ileene Patrick DOB/Age 09-Feb-1946 (Age: 75) Gender: F Collected Date: 11/25/2022 Received Date: 11/29/2022  FINAL DIAGNOSIS       1. Surgical [P], colon, ascending, polyp (1) :       - TUBULAR ADENOMA.      - NO HIGH GRADE DYSPLASIA OR MALIGNANCY.       DATE SIGNED OUT: 11/30/2022 ELECTRONIC SIGNATURE [place] Callas M.D., Nupur,  Pathologist, Electronic Signature  MICROSCOPIC DESCRIPTION  CASE COMMENTS STAINS USED IN DIAGNOSIS: H&E    CLINICAL HISTORY  SPECIMEN(S) OBTAINED 1. Surgical [P], Colon, Ascending, Polyp (1)  SPECIMEN COMMENTS: 1. Change in bowel  habits; benign neoplasm of ascending colon SPECIMEN CLINICAL INFORMATION: 1. R/O adenoma    Gross D escription 1. Received in formalin are tan, soft tissue fragments that are submitted in toto. Number: 1 Size: 1.0 cm, (1B) ( TA )        Report signed out from the following location(s) [place] [address], [place] [address] CLIA #: [id number]  [facility] [address] [place], [place] [address] CLIA #: [id number]   POCT urinalysis dipstick     Status: Abnormal   Collection Time: 12/20/22  2:09 PM  Result Value Ref Range   Color, UA yellow yellow   Clarity, UA cloudy (A) clear   Glucose, UA negative negative mg/dL   Bilirubin, UA negative negative   Ketones, POC UA negative negative mg/dL   Spec Grav, UA >=0.938 (A) 1.010 - 1.025   Blood, UA moderate (A) negative   pH, UA 5.5 5.0 - 8.0   Protein Ur, POC negative negative mg/dL   Urobilinogen, UA 0.2 0.2 or 1.0 E.U./dL   Nitrite, UA Negative Negative   Leukocytes, UA Moderate (2+) (A) Negative  Urine Culture     Status: Abnormal   Collection Time: 12/20/22  2:11 PM   Specimen: Urine, Clean Catch  Result Value Ref Range   Specimen Description URINE, CLEAN CATCH    Special Requests      NONE Performed at [facility], [address] [place], [place] [address]    Culture >=100,000 COLONIES/mL ESCHERICHIA COLI (A)    Report Status 12/22/2022 FINAL    Organism ID, Bacteria ESCHERICHIA COLI (A)       Susceptibility   Escherichia coli - MIC*    AMPICILLIN 4 SENSITIVE Sensitive     CEFAZOLIN <=4 SENSITIVE Sensitive     CEFEPIME <=0.12 SENSITIVE Sensitive     CEFTRIAXONE <=0.25 SENSITIVE Sensitive     CIPROFLOXACIN <=0.25  SENSITIVE Sensitive     GENTAMICIN <=1 SENSITIVE Sensitive     IMIPENEM <=0.25 SENSITIVE Sensitive     NITROFURANTOIN <=16 SENSITIVE Sensitive     TRIMETH/SULFA <=20 SENSITIVE Sensitive     AMPICILLIN/SULBACTAM <=2 SENSITIVE Sensitive     PIP/TAZO <=4 SENSITIVE Sensitive ug/mL    * >=100,000 COLONIES/mL ESCHERICHIA COLI  POCT urinalysis dipstick     Status: Abnormal   Collection Time: 01/26/23  2:00 PM  Result Value Ref Range   Color, UA yellow yellow   Clarity, UA cloudy (A) clear   Glucose, UA negative negative mg/dL   Bilirubin, UA negative negative   Ketones, POC UA negative negative mg/dL   Spec Grav, UA >=3.716 (A) 1.010 - 1.025   Blood, UA large (A) negative   pH, UA 5.5 5.0 - 8.0   Protein Ur, POC =100 (A) negative mg/dL   Urobilinogen, UA 0.2 0.2 or 1.0 E.U./dL   Nitrite, UA Negative Negative   Leukocytes, UA Small (1+) (A) Negative    Assessment and Plan :   PDMP not reviewed this encounter.  1. Acute cystitis with hematuria    Creatinine clearance 100 mL/min.  Start Macrobid to cover for acute cystitis, urine culture pending.  This is appropriate as patient just took cephalexin a month ago and then it is my effort to avoid antibiotic resistance.  Continue follow-up with with urogynecology.  Recommended continued hydration, limiting urinary irritants. Counseled patient on potential for adverse effects with medications prescribed/recommended today, ER and return-to-clinic precautions discussed, patient verbalized understanding.  Wallis Bamberg, PA-C 01/26/23 1428

## 2023-01-26 NOTE — ED Triage Notes (Signed)
Pt presents to U/C c/o urinary frequency, dysuria that started today. Pt has a appointment 12/10 to see PCP for a referral.

## 2023-01-26 NOTE — Discharge Instructions (Signed)
Please start Macrobid to address an urinary tract infection. Make sure you hydrate very well with plain water and a quantity of 64 ounces of water a day.  Please limit drinks that are considered urinary irritants such as soda, sweet tea, coffee, energy drinks, alcohol.  These can worsen your urinary and genital symptoms but also be the source of them.  I will let you know about your urine culture results through MyChart to see if we need to prescribe or change your antibiotics based off of those results.

## 2023-01-29 LAB — URINE CULTURE: Culture: 30000 — AB

## 2023-02-06 ENCOUNTER — Other Ambulatory Visit: Payer: Self-pay | Admitting: Family Medicine

## 2023-02-06 DIAGNOSIS — K219 Gastro-esophageal reflux disease without esophagitis: Secondary | ICD-10-CM

## 2023-02-08 ENCOUNTER — Ambulatory Visit (INDEPENDENT_AMBULATORY_CARE_PROVIDER_SITE_OTHER): Payer: Medicare Other | Admitting: Family Medicine

## 2023-02-08 ENCOUNTER — Encounter: Payer: Self-pay | Admitting: Family Medicine

## 2023-02-08 VITALS — BP 120/74 | HR 85 | Temp 98.1°F | Ht 66.25 in | Wt 204.5 lb

## 2023-02-08 DIAGNOSIS — R159 Full incontinence of feces: Secondary | ICD-10-CM | POA: Diagnosis not present

## 2023-02-08 DIAGNOSIS — R32 Unspecified urinary incontinence: Secondary | ICD-10-CM

## 2023-02-08 DIAGNOSIS — N390 Urinary tract infection, site not specified: Secondary | ICD-10-CM

## 2023-02-08 DIAGNOSIS — B372 Candidiasis of skin and nail: Secondary | ICD-10-CM | POA: Diagnosis not present

## 2023-02-08 MED ORDER — NITROFURANTOIN MACROCRYSTAL 50 MG PO CAPS: 50.0000 mg | ORAL_CAPSULE | Freq: Every day | ORAL | 1 refills | Status: DC

## 2023-02-08 MED ORDER — NYSTATIN 100000 UNIT/GM EX OINT: 1.0000 | TOPICAL_OINTMENT | Freq: Two times a day (BID) | CUTANEOUS | 0 refills | Status: AC

## 2023-02-08 NOTE — Patient Instructions (Addendum)
A and D ointment-- apply on top of the nystatin ointment

## 2023-02-08 NOTE — Progress Notes (Signed)
Established Patient Office Visit  Subjective   Patient ID: Jillian Guerrero, female    DOB: [birth year]  Age: 77 y.o. MRN: [id number]  Chief Complaint  Patient presents with   Urinary Tract Infection    Patient complains of recurrent UTIs, states she has had 6 this year and the urgent care advised she contact PCP    Pt is here to discuss her recurrent UTI's -- pt states that she has had 4 UTI's since July 2024, the first grew E. Faecalis, then she grew the same E. Coli three times. She reports that the last round of antibiotics really helped and now she has no symptoms at all. States that she was having a problem with fecal incontinence-- states she went to the GI doctor and had a colonoscopy-- she was told that everything was fine- states that they cannot figure out why she is having chronic constipation and then fecal incontinence. Thinks that this problem with her bowels is causing the UTI's she is getting.  Pt is reporting a itchy red rash in the right groin area-- states that she has been using OTC creams but it is not helping-- states it is not in the left groin just the right-- pt has been on antibiotics for an extended period of time, and is wearing pads due to the incontinence.     Current Outpatient Medications  Medication Instructions   alendronate (FOSAMAX) 70 MG tablet TAKE 1 TABLET EVERY 7 DAYS - TAKE WITH A FULL GLASS OF WATER ON AN EMPTY STOMACH.   atorvastatin (LIPITOR) 10 MG tablet TAKE 1 TABLET EVERY DAY (NEED MD APPOINTMENT)   calcium citrate-vitamin D (CITRACAL+D) 315-200 MG-UNIT tablet 1 tablet, Oral, 2 times daily   cetirizine (ZYRTEC) 10 mg, Oral, Daily   gabapentin (NEURONTIN) 600 mg, Oral, 2 times daily   nitrofurantoin (MACRODANTIN) 50 mg, Oral, Daily at bedtime   nystatin ointment (MYCOSTATIN) 1 Application, Topical, 2 times daily   pantoprazole (PROTONIX) 40 MG tablet TAKE 1 TABLET EVERY DAY (NEED MD APPOINTMENT)   tiZANidine (ZANAFLEX) 4 mg, Oral, Daily at  bedtime   valACYclovir (VALTREX) 1000 MG tablet TAKE 1 TABLET EVERY DAY (NEED MD APPOINTMENT)       Review of Systems  All other systems reviewed and are negative.     Objective:     BP 120/74 (BP Location: Left Arm, Patient Position: Sitting, Cuff Size: Large)   Pulse 85   Temp 98.1 F (36.7 C) (Oral)   Ht 5' 6.25" (1.683 m)   Wt 204 lb 8 oz (92.8 kg)   SpO2 96%   BMI 32.76 kg/m    Physical Exam Vitals reviewed.  Constitutional:      Appearance: Normal appearance. She is well-groomed. She is obese.  Cardiovascular:     Rate and Rhythm: Normal rate and regular rhythm.     Heart sounds: S1 normal and S2 normal.  Pulmonary:     Effort: Pulmonary effort is normal.     Breath sounds: Normal breath sounds and air entry.  Abdominal:     General: Bowel sounds are normal.  Musculoskeletal:     Right lower leg: No edema.     Left lower leg: No edema.  Neurological:     Mental Status: She is alert and oriented to person, place, and time. Mental status is at baseline.     Gait: Gait is intact.  Psychiatric:        Mood and Affect: Mood and affect normal.  Speech: Speech normal.        Behavior: Behavior normal.      No results found for any visits on 02/08/23.    The 10-year ASCVD risk score (Arnett DK, et al., 2019) is: 20%    Assessment & Plan:  Recurrent UTI      Pt grew the same bacteria 3 times in a row, will place her on small dose once daily of nitrofurantoin to help reduce the recurrence. Will send patient to UroGYN to discuss her symptoms. We discussed sending her to pelvic floor therapy however pt wants to wait to see the specialist first.   -     Ambulatory referral to Urogynecology -     Nitrofurantoin Macrocrystal; Take 1 capsule (50 mg total) by mouth at bedtime.  Dispense: 90 capsule; Refill: 1  Urinary and fecal incontinence -     Ambulatory referral to Urogynecology  Candidal intertrigo Will treat with nystatin cream BID -      Nystatin; Apply 1 Application topically 2 (two) times daily.  Dispense: 30 g; Refill: 0     Return in about 6 months (around 08/09/2023).    Karie Georges, MD

## 2023-02-09 ENCOUNTER — Encounter: Payer: Self-pay | Admitting: Adult Health

## 2023-02-09 ENCOUNTER — Ambulatory Visit: Payer: Medicare Other | Admitting: Adult Health

## 2023-02-09 VITALS — BP 110/62 | HR 86 | Temp 97.8°F | Ht 66.25 in | Wt 203.2 lb

## 2023-02-09 DIAGNOSIS — K219 Gastro-esophageal reflux disease without esophagitis: Secondary | ICD-10-CM | POA: Diagnosis not present

## 2023-02-09 DIAGNOSIS — J45909 Unspecified asthma, uncomplicated: Secondary | ICD-10-CM | POA: Diagnosis not present

## 2023-02-09 DIAGNOSIS — J42 Unspecified chronic bronchitis: Secondary | ICD-10-CM

## 2023-02-09 DIAGNOSIS — J4489 Other specified chronic obstructive pulmonary disease: Secondary | ICD-10-CM

## 2023-02-09 DIAGNOSIS — J310 Chronic rhinitis: Secondary | ICD-10-CM

## 2023-02-09 NOTE — Progress Notes (Signed)
@Patient  ID: Jillian Guerrero, female    DOB: 16-Mar-1945, 77 y.o.   MRN: [id number]  Chief Complaint  Patient presents with   Follow-up   Discussed the use of AI scribe software for clinical note transcription with the patient, who gave verbal consent to proceed.  Referring provider: Karie Georges, MD  HPI: 77 yo female former smoker followed for mild COPD with Asthma.  Medical history significant for vocal cord polyps  TEST/EVENTS :  PFTs January 02, 2021 FEV1 82%, ratio 75, FVC 83%, no significant bronchodilator response, DLCO 99%.  .02/09/2023 Follow up : COPD with Asthma  Patient presents for a follow-up visit.  Last seen January 2024.  Patient is followed for mild COPD with asthma.  Overall says she is doing well.   She reports no significant respiratory symptoms .  The patient also reports a postnasal drip and acid reflux, which she manages with daily Zyrtec and Protonix. She notes that without these medications, she experiences a persistent cough. She denies any other symptoms related to acid reflux/  She has been experiencing recurrent urinary tract infections (UTIs), with six episodes in the past year. Her most recent UTI was five weeks ago, and she is currently on Nitrofurantoin as a prophylactic measure. She reports no other urinary symptoms.  The patient remains active, achieving 10,000 to 15,000 steps daily by staying busy around her house and garden. She received her flu shot for the year and is considering getting the RSV vaccine. She has not required any changes to her respiratory medications and continues to manage her symptoms with albuterol as needed.  The patient is originally from [place] and moved to the [place] in 1983. She has been living with her daughter since her husband passed away. She remains socially active and enjoys spending time with her visiting cousin from [place].       Allergies  Allergen Reactions   Ciprofloxacin Swelling    Collagen     Redness, swelling    Immunization History  Administered Date(s) Administered   Fluad Quad(high Dose 65+) 11/10/2021   Hepatitis A 01/02/2014   Hepatitis A, Adult 01/02/2014, 02/11/2014   Influenza-Unspecified 02/22/2020, 01/09/2023   Moderna Sars-Covid-2 Vaccination 06/12/2019, 07/11/2019, 04/08/2020, 01/19/2021   Pneumococcal Conjugate-13 12/18/2013   Pneumococcal Polysaccharide-23 09/12/2012   Td 11/08/2013   Tdap 01/02/2014   Typhoid Inactivated 01/02/2014   Typhoid Live 01/02/2014   Yellow Fever 01/02/2014   Zoster Recombinant(Shingrix) 12/12/2020, 04/01/2021    Past Medical History:  Diagnosis Date   Asthma    GERD (gastroesophageal reflux disease)    Hyperlipidemia    Lumbar compression fracture (HCC)    Macular degeneration, wet (HCC)    both eyes   Osteopenia    Osteoporosis    Peripheral neuropathy     Tobacco History: Social History   Tobacco Use  Smoking Status Former   Current packs/day: 1.00   Average packs/day: 1 pack/day for 40.0 years (40.0 ttl pk-yrs)   Types: Cigarettes  Smokeless Tobacco Never  Tobacco Comments   Quit in 2002   Counseling given: Not Answered Tobacco comments: Quit in 2002   Outpatient Medications Prior to Visit  Medication Sig Dispense Refill   alendronate (FOSAMAX) 70 MG tablet TAKE 1 TABLET EVERY 7 DAYS - TAKE WITH A FULL GLASS OF WATER ON AN EMPTY STOMACH. 12 tablet 3   atorvastatin (LIPITOR) 10 MG tablet TAKE 1 TABLET EVERY DAY (NEED MD APPOINTMENT) 90 tablet 0   calcium citrate-vitamin D (  CITRACAL+D) 315-200 MG-UNIT tablet Take 1 tablet by mouth 2 (two) times daily.     cetirizine (ZYRTEC) 10 MG tablet Take 10 mg by mouth daily.     gabapentin (NEURONTIN) 600 MG tablet Take 1 tablet (600 mg total) by mouth 2 (two) times daily. 180 tablet 1   nystatin ointment (MYCOSTATIN) Apply 1 Application topically 2 (two) times daily. 30 g 0   pantoprazole (PROTONIX) 40 MG tablet TAKE 1 TABLET EVERY DAY (NEED MD  APPOINTMENT) 90 tablet 1   tiZANidine (ZANAFLEX) 4 MG tablet TAKE 1 TABLET AT BEDTIME 90 tablet 3   valACYclovir (VALTREX) 1000 MG tablet TAKE 1 TABLET EVERY DAY (NEED MD APPOINTMENT) 90 tablet 0   nitrofurantoin (MACRODANTIN) 50 MG capsule Take 1 capsule (50 mg total) by mouth at bedtime. (Patient not taking: Reported on 02/09/2023) 90 capsule 1   No facility-administered medications prior to visit.     Review of Systems:   Constitutional:   No  weight loss, night sweats,  Fevers, chills, fatigue, or  lassitude.  HEENT:   No headaches,  Difficulty swallowing,  Tooth/dental problems, or  Sore throat,                No sneezing, itching, ear ache,+ nasal congestion, post nasal drip,   CV:  No chest pain,  Orthopnea, PND, swelling in lower extremities, anasarca, dizziness, palpitations, syncope.   GI  No heartburn, indigestion, abdominal pain, nausea, vomiting, diarrhea, change in bowel habits, loss of appetite, bloody stools.   Resp: No shortness of breath with exertion or at rest.  No excess mucus, no productive cough,  No non-productive cough,  No coughing up of blood.  No change in color of mucus.  No wheezing.  No chest wall deformity  Skin: no rash or lesions.  GU: no dysuria, change in color of urine, no urgency or frequency.  No flank pain, no hematuria   MS:  No joint pain or swelling.  No decreased range of motion.  No back pain.    Physical Exam  BP 110/62 (BP Location: Left Arm, Patient Position: Sitting, Cuff Size: Large)   Pulse 86   Temp 97.8 F (36.6 C)   Ht 5' 6.25" (1.683 m)   Wt 203 lb 3.2 oz (92.2 kg)   SpO2 95%   BMI 32.55 kg/m   GEN: A/Ox3; pleasant , NAD, well nourished    HEENT:  [place]/AT,   NOSE-clear, THROAT-clear, no lesions, no postnasal drip or exudate noted.   NECK:  Supple w/ fair ROM; no JVD; normal carotid impulses w/o bruits; no thyromegaly or nodules palpated; no lymphadenopathy.    RESP  Clear  P & A; w/o, wheezes/ rales/ or rhonchi. no  accessory muscle use, no dullness to percussion  CARD:  RRR, no m/r/g, no peripheral edema, pulses intact, no cyanosis or clubbing.  GI:   Soft & nt; nml bowel sounds; no organomegaly or masses detected.   Musco: Warm bil, no deformities or joint swelling noted.   Neuro: alert, no focal deficits noted.    Skin: Warm, no lesions or rashes    Lab Results:    BMET   BNP No results found for: "BNP"  ProBNP No results found for: "PROBNP"  Imaging: No results found.  Administration History     None          Latest Ref Rng & Units 01/02/2021   10:58 AM  PFT Results  FVC-Pre L 2.57   FVC-Predicted Pre %  81   FVC-Post L 2.62   FVC-Predicted Post % 83   Pre FEV1/FVC % % 72   Post FEV1/FCV % % 75   FEV1-Pre L 1.85   FEV1-Predicted Pre % 78   FEV1-Post L 1.95   DLCO uncorrected ml/min/mmHg 21.03   DLCO UNC% % 99   DLCO corrected ml/min/mmHg 21.03   DLCO COR %Predicted % 99   DLVA Predicted % 121   TLC L 5.68   TLC % Predicted % 103   RV % Predicted % 130     No results found for: "NITRICOXIDE"      Assessment & Plan:  Assessment and Plan    Chronic Obstructive Pulmonary Disease (COPD) with Asthma   She has mild COPD with asthma, characterized by intermittent coughing and mucus production, last chest x-ray in 2023 showed clear lungs.  We will continue albuterol as needed, keep the nebulizer at home, and consider a daily inhaler if symptoms develop.   Postnasal Drip and Gastroesophageal Reflux Disease (GERD)   Her postnasal drip and GERD are managed with daily Zyrtec and Protonix (pantoprazole), presenting with coughing but without other typical GERD symptoms. We will continue Zyrtec and Protonix daily. .  General Health Maintenance   She received the flu shot and was advised to get the RSV vaccine. She is physically active, walking 10,000 to 15,000 steps daily. We advise getting the RSV vaccine at the pharmacy and maintaining the current level of  physical activity.  Follow-up   Schedule a follow-up in one year and contact us if symptoms worsen or concerns arise.           Rubye Oaks, NP 02/09/2023

## 2023-02-09 NOTE — Patient Instructions (Addendum)
Delsym 2 tsp Twice daily  As needed  cough Tessalon Three times a day  as needed cough  Saline nasal rinses As needed   Zyrtec daily  Continue on Protonix daily  GERD diet  Albuterol inhaler or neb As needed   RSV vaccine as discussed.  Follow up with Dr. Delton Coombes or Elo Marmolejos NP in 1 year and As needed   Please contact office for sooner follow up if symptoms do not improve or worsen or seek emergency care

## 2023-03-14 ENCOUNTER — Other Ambulatory Visit: Payer: Self-pay | Admitting: Family Medicine

## 2023-03-14 DIAGNOSIS — E782 Mixed hyperlipidemia: Secondary | ICD-10-CM

## 2023-03-14 NOTE — Progress Notes (Deleted)
Office Note    HPI: Jillian Guerrero is a 78 y.o. (May 16, 1945) female who presents in follow-up with known venous insufficiency CEAP 4.  She was a previous patient of my partner Dr. Edilia Guerrero and was noted to have right sided swelling with significant pain from the knee down.  DVT negative, significant superficial venous reflux.  At her last visit, she was doing much better, and noted significant improvement with compression stockings.  There was a discussion regarding laser ablation with plans for 39-month follow-up to discuss the procedure further should the right leg remains symptomatic.  Patient also has history of AAA, which was last imaged in 04/28/19 at 3.5 cm.   On exam today, Jillian Guerrero was doing well.  She had no complaints.  Originally from [place], [place].  Her husband lived in [place] for years where he owned an Psychiatric nurse.  She moved to [place] to be closer to the one of her daughters.  Her other 3 children live in [place].  Her husband passed away in 04-27-17 in his sleep.    Regarding her right lower extremity pain -she has been asymptomatic for a number of months.  She has not been wearing her compression stockings, but stated she would start wearing them again should the right lower extremity pain or swelling recur.  Denies significant heaviness.  No significant varicosities, ulceration.  Some telangiectasias unchanged   No history of DVT, no history of venous procedures. Denies symptoms of claudication, ischemic rest pain, tissue loss.   Past Medical History:  Diagnosis Date   Asthma    GERD (gastroesophageal reflux disease)    Hyperlipidemia    Lumbar compression fracture (HCC)    Macular degeneration, wet (HCC)    both eyes   Osteopenia    Osteoporosis    Peripheral neuropathy     Past Surgical History:  Procedure Laterality Date   APPENDECTOMY  1956   BLADDER SUSPENSION  04/27/2001   BREAST BIOPSY  2015   hyaluronic acid  viscosupplementation Bilateral    knees - 01/16/2018 x2   IR RADIOLOGIST EVAL & MGMT  01/21/2021   KYPHOPLASTY  10/2018   TONSILLECTOMY  1964   UPPER GI ENDOSCOPY  03/08/2019   gastritis    Social History   Socioeconomic History   Marital status: Widowed    Spouse name: Not on file   Number of children: 2   Years of education: Not on file   Highest education level: 12th grade  Occupational History   Occupation: retired  Tobacco Use   Smoking status: Former    Current packs/day: 1.00    Average packs/day: 1 pack/day for 40.0 years (40.0 ttl pk-yrs)    Types: Cigarettes   Smokeless tobacco: Never   Tobacco comments:    Quit in Apr 27, 2000  Vaping Use   Vaping status: Never Used  Substance and Sexual Activity   Alcohol use: Never   Drug use: Never   Sexual activity: Not on file  Other Topics Concern   Not on file  Social History Narrative   Right handed    Lives alone    Social Drivers of Health   Financial Resource Strain: Low Risk  (02/07/2023)   Overall Financial Resource Strain (CARDIA)    Difficulty of Paying Living Expenses: Not hard at all  Food Insecurity: No Food Insecurity (02/07/2023)   Hunger Vital Sign    Worried About Running Out of Food in the Last  Year: Never true    Ran Out of Food in the Last Year: Never true  Transportation Needs: No Transportation Needs (02/07/2023)   PRAPARE - Administrator, Civil Service (Medical): No    Lack of Transportation (Non-Medical): No  Physical Activity: Sufficiently Active (02/07/2023)   Exercise Vital Sign    Days of Exercise per Week: 5 days    Minutes of Exercise per Session: 60 min  Stress: No Stress Concern Present (02/07/2023)   [employer] of Occupational Health - Occupational Stress Questionnaire    Feeling of Stress : Not at all  Social Connections: Moderately Integrated (02/07/2023)   Social Connection and Isolation Panel [NHANES]    Frequency of Communication with Friends and Family: More than  three times a week    Frequency of Social Gatherings with Friends and Family: More than three times a week    Attends Religious Services: More than 4 times per year    Active Member of [employer] or Organizations: No    Attends Engineer, structural: More than 4 times per year    Marital Status: Widowed  Intimate Partner Violence: Not At Risk (10/20/2022)   Humiliation, Afraid, Rape, and Kick questionnaire    Fear of Current or Ex-Partner: No    Emotionally Abused: No    Physically Abused: No    Sexually Abused: No   Family History  Problem Relation Age of Onset   Arthritis Mother    Heart disease Mother    Arthritis Father    Depression Father    Arthritis Brother    High blood pressure Brother    High Cholesterol Brother    Arthritis Maternal Grandfather    Asthma Paternal Grandmother    Depression Paternal Grandmother    Heart disease Paternal Grandmother    High Cholesterol Paternal Grandmother    Arthritis Daughter    Arthritis Son    Heart attack Son    High Cholesterol Son    Colon cancer Neg Hx    Esophageal cancer Neg Hx    Rectal cancer Neg Hx     Current Outpatient Medications  Medication Sig Dispense Refill   alendronate (FOSAMAX) 70 MG tablet TAKE 1 TABLET EVERY 7 DAYS - TAKE WITH A FULL GLASS OF WATER ON AN EMPTY STOMACH. 12 tablet 3   atorvastatin (LIPITOR) 10 MG tablet TAKE 1 TABLET EVERY DAY (NEED MD APPOINTMENT) 90 tablet 1   calcium citrate-vitamin D (CITRACAL+D) 315-200 MG-UNIT tablet Take 1 tablet by mouth 2 (two) times daily.     cetirizine (ZYRTEC) 10 MG tablet Take 10 mg by mouth daily.     gabapentin (NEURONTIN) 600 MG tablet Take 1 tablet (600 mg total) by mouth 2 (two) times daily. 180 tablet 1   nitrofurantoin (MACRODANTIN) 50 MG capsule Take 1 capsule (50 mg total) by mouth at bedtime. (Patient not taking: Reported on 02/09/2023) 90 capsule 1   nystatin ointment (MYCOSTATIN) Apply 1 Application topically 2 (two) times daily. 30 g 0    pantoprazole (PROTONIX) 40 MG tablet TAKE 1 TABLET EVERY DAY (NEED MD APPOINTMENT) 90 tablet 1   tiZANidine (ZANAFLEX) 4 MG tablet TAKE 1 TABLET AT BEDTIME 90 tablet 3   valACYclovir (VALTREX) 1000 MG tablet TAKE 1 TABLET EVERY DAY (NEED MD APPOINTMENT) 90 tablet 0   No current facility-administered medications for this visit.    Allergies  Allergen Reactions   Ciprofloxacin Swelling   Collagen     Redness, swelling  REVIEW OF SYSTEMS:  [X]  denotes positive finding, [ ]  denotes negative finding Cardiac  Comments:  Chest pain or chest pressure:    Shortness of breath upon exertion:    Short of breath when lying flat:    Irregular heart rhythm:        Vascular    Pain in calf, thigh, or hip brought on by ambulation:    Pain in feet at night that wakes you up from your sleep:     Blood clot in your veins:    Leg swelling:         Pulmonary    Oxygen at home:    Productive cough:     Wheezing:         Neurologic    Sudden weakness in arms or legs:     Sudden numbness in arms or legs:     Sudden onset of difficulty speaking or slurred speech:    Temporary loss of vision in one eye:     Problems with dizziness:         Gastrointestinal    Blood in stool:     Vomited blood:         Genitourinary    Burning when urinating:     Blood in urine:        Psychiatric    Major depression:         Hematologic    Bleeding problems:    Problems with blood clotting too easily:        Skin    Rashes or ulcers:        Constitutional    Fever or chills:      PHYSICAL EXAMINATION:  There were no vitals filed for this visit.  General:  WDWN in NAD; vital signs documented above Gait: Not observed HENT: WNL, normocephalic Pulmonary: normal non-labored breathing , without Rales, rhonchi,  wheezing Cardiac: regular HR Abdomen: soft, NT, no masses Skin: without rashes Vascular Exam/Pulses:  Right Left  Radial 2+ (normal) 2+ (normal)  Ulnar    Femoral    Popliteal     DP 2+ (normal) 2+ (normal)  PT     Extremities: without ischemic changes, without Gangrene , without cellulitis; without open wounds;  Musculoskeletal: no muscle wasting or atrophy  Neurologic: A&O X 3;  No focal weakness or paresthesias are detected Psychiatric:  The pt has Normal affect.   Non-Invasive Vascular Imaging:   No DVT, cyst appreciated in the popliteal space.    ASSESSMENT/PLAN:: 78 y.o. female presenting with asymptomatic chronic venous insufficiency.  She has had minimal swelling, and is no longer wearing her compression stockings on a regular basis.  We had a long discussion regarding the importance of continuing compression stockings when able.  Being that she is asymptomatic, I do not think she would have significant benefit with greater saphenous vein ablation at this time.  Regarding her AAA, it was last measured in 2021 at 3.5 cm.  She would benefit from repeat aortic duplex ultrasound.  She remains asymptomatic at this time. My plan is to call her in the next month with her results.   Victorino Sparrow, MD [facility] [phone]

## 2023-03-15 ENCOUNTER — Other Ambulatory Visit: Payer: Self-pay | Admitting: Family Medicine

## 2023-03-15 DIAGNOSIS — G609 Hereditary and idiopathic neuropathy, unspecified: Secondary | ICD-10-CM

## 2023-03-17 ENCOUNTER — Ambulatory Visit (HOSPITAL_COMMUNITY)
Admission: RE | Admit: 2023-03-17 | Discharge: 2023-03-17 | Disposition: A | Discharge status: Home / Self Care | Payer: Medicare Other | Source: Ambulatory Visit | Attending: Vascular Surgery | Admitting: Vascular Surgery

## 2023-03-17 ENCOUNTER — Ambulatory Visit: Payer: Self-pay | Admitting: Vascular Surgery

## 2023-03-17 ENCOUNTER — Ambulatory Visit (INDEPENDENT_AMBULATORY_CARE_PROVIDER_SITE_OTHER): Payer: Self-pay | Admitting: Vascular Surgery

## 2023-03-17 DIAGNOSIS — I714 Abdominal aortic aneurysm, without rupture, unspecified: Secondary | ICD-10-CM

## 2023-03-17 DIAGNOSIS — M7989 Other specified soft tissue disorders: Secondary | ICD-10-CM

## 2023-03-17 NOTE — Progress Notes (Signed)
Patient called regarding recent AAA study.  The size of her infrarenal abdominal aneurysm has not changed since 2021.   It continues to measure 3.3 cm.  Plan will be for repeat study in 3 years.  Victorino Sparrow MD

## 2023-04-04 ENCOUNTER — Other Ambulatory Visit: Payer: Self-pay | Admitting: Family Medicine

## 2023-04-04 DIAGNOSIS — B029 Zoster without complications: Secondary | ICD-10-CM

## 2023-04-21 ENCOUNTER — Ambulatory Visit (INDEPENDENT_AMBULATORY_CARE_PROVIDER_SITE_OTHER): Payer: Medicare Other | Admitting: Obstetrics and Gynecology

## 2023-04-21 ENCOUNTER — Encounter: Payer: Self-pay | Admitting: Obstetrics and Gynecology

## 2023-04-21 VITALS — BP 92/65 | HR 83 | Ht 66.0 in | Wt 199.8 lb

## 2023-04-21 DIAGNOSIS — R350 Frequency of micturition: Secondary | ICD-10-CM | POA: Diagnosis not present

## 2023-04-21 DIAGNOSIS — N952 Postmenopausal atrophic vaginitis: Secondary | ICD-10-CM

## 2023-04-21 DIAGNOSIS — N3941 Urge incontinence: Secondary | ICD-10-CM | POA: Diagnosis not present

## 2023-04-21 DIAGNOSIS — N3281 Overactive bladder: Secondary | ICD-10-CM | POA: Diagnosis not present

## 2023-04-21 DIAGNOSIS — N816 Rectocele: Secondary | ICD-10-CM

## 2023-04-21 DIAGNOSIS — R151 Fecal smearing: Secondary | ICD-10-CM

## 2023-04-21 DIAGNOSIS — Z87440 Personal history of urinary (tract) infections: Secondary | ICD-10-CM

## 2023-04-21 DIAGNOSIS — N8110 Cystocele, unspecified: Secondary | ICD-10-CM

## 2023-04-21 DIAGNOSIS — N390 Urinary tract infection, site not specified: Secondary | ICD-10-CM

## 2023-04-21 LAB — POCT URINALYSIS DIPSTICK
Bilirubin, UA: NEGATIVE
Glucose, UA: NEGATIVE
Ketones, UA: NEGATIVE
Leukocytes, UA: NEGATIVE
Nitrite, UA: NEGATIVE
Protein, UA: NEGATIVE
Spec Grav, UA: 1.025 (ref 1.010–1.025)
Urobilinogen, UA: 0.2 U/dL
pH, UA: 5 (ref 5.0–8.0)

## 2023-04-21 MED ORDER — ESTRADIOL 0.1 MG/GM VA CREA: 0.5000 g | TOPICAL_CREAM | VAGINAL | 11 refills | Status: DC

## 2023-04-21 MED ORDER — MIRABEGRON ER 25 MG PO TB24: 25.0000 mg | ORAL_TABLET | Freq: Every day | ORAL | 1 refills | Status: DC

## 2023-04-21 NOTE — Patient Instructions (Addendum)
Please continue the prophylactic antibioitc medication  Use estrogen cream on your finger nightly for 2 weeks into the vagina and then after 2 weeks start it twice a week.   Start Myrbetriq 25mg  daily  We will start with PTNS and we will plan for urodynamics  We will also plan for a surgical consult with one of my surgical colleagues.

## 2023-04-21 NOTE — Progress Notes (Signed)
[facility] New Patient Evaluation and Consultation  Referring Provider: Karie Georges, MD PCP: Karie Georges, MD Date of Service: 04/21/2023  SUBJECTIVE Chief Complaint: New Patient (Initial Visit) (Jillian Guerrero is a 78 y.o. female is here for recurrent UTI's and urinary incontinence.)  History of Present Illness: Jillian Guerrero is a 78 y.o. White or Caucasian female seen in consultation at the request of Dr. Casimiro Needle for evaluation of recurrent UTI and incontinence.    Review of records significant for:  **Patient believes she had a cadaver facial sling in 2003 in [place]. No records available.   Former Tobacco use (40 pack years)   Is on UTI prophylaxis with Macrobid 50mg  daily   MR Lumbar Spine WO Contrast (03/07/22) L5-S1: Mild broad-based disc bulge. Moderate bilateral facet arthropathy. No foraminal or central canal stenosis.   IMPRESSION: 1. L1 vertebral body compression fracture unchanged compared with 10/20/2020 with 50% height loss and mild residual marrow edema along the superior posterior corner of the L1 vertebral body. 4 mm retropulsion of the superior posterior margin of the L1 vertebral body mildly impressing upon the thecal sac and resulting in mild spinal stenosis. 2. Lumbar spine spondylosis as described above.  Urinary Symptoms: Leaks urine with cough/ sneeze, with a full bladder, with movement to the bathroom, and without sensation Leaks 1-3 time(s) per days.  Pad use: 2 pads per day.   Patient is bothered by UI symptoms.  Day time voids 5-6.  Nocturia: 1-3 times per night to void. Voiding dysfunction:  does not empty bladder well.  Patient does not use a catheter to empty bladder.  When urinating, patient feels a weak stream, difficulty starting urine stream, and the need to urinate multiple times in a row Drinks: 1 cup coffee AM, 32-48oz water and drinks water at night, sometimes does juice that has been watered down per  day  UTIs: 6 UTI's in the last year.   Reports history of blood in urine Susceptibility data from last 90 days. Collected Specimen Info Organism AMPICILLIN AMPICILLIN/SULBACTAM CEFAZOLIN CEFEPIME CEFTRIAXONE Ciprofloxacin Gentamicin Susc lslt Imipenem Nitrofurantoin Susc lslt Piperacillin + Tazobactam Trimethoprim/Sulfa  01/26/23 Urine, Clean Catch Escherichia coli  S  S  S  S  S  S  S  S  S  S  S    Pelvic Organ Prolapse Symptoms:                  Patient Denies a feeling of a bulge the vaginal area.   Bowel Symptom: Bowel movements: Varies (Sometimes every 2-3 days, sometimes 3-5 a day) time(s) per day Stool consistency: soft  Straining: no.  Splinting: no.  Incomplete evacuation: yes.  Patient Admits to accidental bowel leakage / fecal incontinence  Occurs: few time(s) per week  Consistency with leakage: soft  Bowel regimen: diet Last colonoscopy: Date 11/25/22, Results WNL HM Colonoscopy          Completed or No Longer Recommended     Colonoscopy  Discontinued      Frequency changed to Never automatically (Topic No Longer Applies)   11/25/2022  COLONOSCOPY   Only the first 1 history entries have been loaded, but more history exists.                Sexual Function Sexually active: no.  Sexual orientation: Straight Pain with sex: No  Pelvic Pain Denies pelvic pain    Past Medical History:  Past Medical History:  Diagnosis Date   Asthma  GERD (gastroesophageal reflux disease)    Hyperlipidemia    Lumbar compression fracture (HCC)    Macular degeneration, wet (HCC)    both eyes   Osteopenia    Osteoporosis    Peripheral neuropathy      Past Surgical History:   Past Surgical History:  Procedure Laterality Date   APPENDECTOMY  1956   BLADDER SUSPENSION  2003   BREAST BIOPSY  2015   hyaluronic acid viscosupplementation Bilateral    knees - 01/16/2018 x2   IR RADIOLOGIST EVAL & MGMT  01/21/2021   KYPHOPLASTY  10/2018   TONSILLECTOMY  1964    UPPER GI ENDOSCOPY  03/08/2019   gastritis     Past OB/GYN History: G2 P2 G2P2002 Vaginal deliveries: 2,  Forceps/ Vacuum deliveries: 0, Cesarean section: 0 Menopausal: Yes, at age 63 Contraception: N/a. Last pap smear was 2015.  Any history of abnormal pap smears: no. HM PAP   This patient has no relevant Health Maintenance data.     Medications: Patient has a current medication list which includes the following prescription(s): alendronate, atorvastatin, calcium citrate-vitamin d, cetirizine, estradiol, gabapentin, mirabegron er, nitrofurantoin, nystatin ointment, pantoprazole, tizanidine, and valacyclovir.   Allergies: Patient is allergic to ciprofloxacin and collagen.   Social History:  Social History   Tobacco Use   Smoking status: Former    Current packs/day: 1.00    Average packs/day: 1 pack/day for 40.0 years (40.0 ttl pk-yrs)    Types: Cigarettes   Smokeless tobacco: Never   Tobacco comments:    Quit in 2002  Vaping Use   Vaping status: Never Used  Substance Use Topics   Alcohol use: Never   Drug use: Never    Relationship status: widowed Patient lives with alone.   Patient is not employed. Regular exercise: Yes: Walking and Gardening  History of abuse: No  Family History:   Family History  Problem Relation Age of Onset   Arthritis Mother    Heart disease Mother    Arthritis Father    Depression Father    Arthritis Brother    High blood pressure Brother    High Cholesterol Brother    Arthritis Maternal Grandfather    Asthma Paternal Grandmother    Depression Paternal Grandmother    Heart disease Paternal Grandmother    High Cholesterol Paternal Grandmother    Arthritis Daughter    Arthritis Son    Heart attack Son    High Cholesterol Son    Colon cancer Neg Hx    Esophageal cancer Neg Hx    Rectal cancer Neg Hx      Review of Systems: Review of Systems  Constitutional:  Negative for chills and fever.  Respiratory:  Positive for  wheezing. Negative for cough and shortness of breath.   Cardiovascular:  Positive for leg swelling. Negative for chest pain and palpitations.  Gastrointestinal:  Negative for abdominal pain, blood in stool, constipation and diarrhea.  Skin:  Negative for rash.  Neurological:  Positive for dizziness. Negative for weakness.  Endo/Heme/Allergies:  Bruises/bleeds easily.  Psychiatric/Behavioral:  Negative for depression and suicidal ideas.      OBJECTIVE Physical Exam: Vitals:   04/21/23 1010  BP: 92/65  Pulse: 83  Weight: 199 lb 12.8 oz (90.6 kg)  Height: 5\' 6"  (1.676 m)    Physical Exam Vitals reviewed. Exam conducted with a chaperone present.  Constitutional:      Appearance: Normal appearance.  Pulmonary:     Effort: Pulmonary effort is normal.  Abdominal:     Palpations: Abdomen is soft.  Neurological:     General: No focal deficit present.     Mental Status: She is alert and oriented to person, place, and time.  Psychiatric:        Mood and Affect: Mood normal.        Behavior: Behavior normal. Behavior is cooperative.        Thought Content: Thought content normal.      GU / Detailed Urogynecologic Evaluation:  Pelvic Exam: Normal external female genitalia; Bartholin's and Skene's glands normal in appearance; urethral meatus normal in appearance, no urethral masses or discharge.   CST: negative  Speculum exam reveals normal vaginal mucosa with atrophy. Cervix normal appearance. Uterus normal single, nontender. Adnexa normal adnexa.     With apex supported, anterior compartment defect was reduced  Pelvic floor strength III/V  Pelvic floor musculature: Right levator non-tender, Right obturator tender, Left levator non-tender, Left obturator non-tender  POP-Q:   POP-Q  -1.5                                            Aa   -1.5                                           Ba  -5.5                                              C   3                                             Gh  2.5                                            Pb  10                                            tvl   -0.5                                            Ap  -0.5                                            Bp  -7                                              D      Rectal Exam:  Normal  external exam  Post-Void Residual (PVR) by Bladder Scan: In order to evaluate bladder emptying, we discussed obtaining a postvoid residual and patient agreed to this procedure.  Procedure: The ultrasound unit was placed on the patient's abdomen in the suprapubic region after the patient had voided.    Post Void Residual - 04/21/23 1038       Post Void Residual   Post Void Residual 24 mL              Laboratory Results: Lab Results  Component Value Date   COLORU yellow 04/21/2023   CLARITYU clear 04/21/2023   GLUCOSEUR Negative 04/21/2023   BILIRUBINUR negative 04/21/2023   KETONESU negative 04/21/2023   SPECGRAV 1.025 04/21/2023   RBCUR Tract intact 04/21/2023   PHUR 5.0 04/21/2023   PROTEINUR Negative 04/21/2023   UROBILINOGEN 0.2 04/21/2023   LEUKOCYTESUR Negative 04/21/2023    Lab Results  Component Value Date   CREATININE 0.67 05/04/2022   CREATININE 0.71 03/24/2020   CREATININE 0.8 09/17/2019    No results found for: "HGBA1C"  Lab Results  Component Value Date   HGB 14.3 05/04/2022     ASSESSMENT AND PLAN Ms. Caroll is a 78 y.o. with:  1. Urinary frequency   2. OAB (overactive bladder)   3. Urge incontinence   4. Vaginal atrophy   5. Prolapse of anterior vaginal wall   6. Posterior vaginal wall prolapse   7. Fecal smearing   8. Recurrent UTI    We discussed the symptoms of overactive bladder (OAB), which include urinary urgency, urinary frequency, nocturia, with or without urge incontinence.  While we do not know the exact etiology of OAB, several treatment options exist. We discussed management including behavioral therapy (decreasing  bladder irritants, urge suppression strategies, timed voids, bladder retraining), physical therapy, medication; for refractory cases posterior tibial nerve stimulation, sacral neuromodulation, and intravesical botulinum toxin injection. For anticholinergic medications, we discussed the potential side effects of anticholinergics including dry eyes, dry mouth, constipation, cognitive impairment and urinary retention. For Beta-3 agonist medication, we discussed the potential side effect of elevated blood pressure which is more likely to occur in individuals with uncontrolled hypertension. Patient reports she has already done pelvic floor PT 20 years ago and does not think this would be effective for her. We discussed in depth that, with her specific back problems, there is most likely a nerve component related to her loss of bowel and urine.  We will start Myrbetriq 25mg  daily and do a blood pressure check in a few weeks. We will also plan to start PTNS and see if this is helpful for her. We briefly discussed bladder botox and SNM. Patient does not do well with in office procedures and reportedly had a vagal response to stress. If she were to want to undergo any other procedures, I would suggest doing them in the OR based on her historical issues.  Patient has vaginal atrophy on exam. She would benefit from estrogen cream. Patient to use a blueberry sized amount into the vagina. She may use this nightly for 2 weeks and then twice weekly after. We discussed using her finger instead of using the applicator.  Patient has stage I/IV anterior, II/IV posterior and I/IV uterine prolapse. She reported that she had felt this sensation before of something "Poking out of her". It's possible she had it dropping down more at that time.  For treatment of POP we discussed surgical intervention of colpocleisis as she is not interested in being sexually  active in the future. We also discussed pessary. Patient reports she thinks  she would like to have her prolapse repaired. Patient will undergo urodynamics testing prior to meeting with surgeon for follow up. Patient reports she wants surgery after July as she has a [place] planned.  Patient has significant fecal leakage and smearing that happens frequently. She has tried using fiber without success. Patient may be a good candidate for SNM. Would not jump to SNM due to the surgical nature, but would keep it as an option. Patient would need to undergo a stage 1 and not a PNE due to her hypotensive history with in office procedures.Will plan for patient to follow up with surgeon to discuss this further.  Patient is on prophylaxis for UTI's. She is currently on 50mg  Nitrofurantoin daily. Patient to continue this.    Patient to return for PTNS and Urodynamics.        Selmer Dominion, NP

## 2023-04-27 ENCOUNTER — Encounter: Payer: Self-pay | Admitting: Obstetrics and Gynecology

## 2023-04-27 ENCOUNTER — Ambulatory Visit (INDEPENDENT_AMBULATORY_CARE_PROVIDER_SITE_OTHER): Payer: Medicare Other | Admitting: Obstetrics and Gynecology

## 2023-04-27 VITALS — BP 124/82 | HR 93

## 2023-04-27 DIAGNOSIS — N3281 Overactive bladder: Secondary | ICD-10-CM | POA: Diagnosis not present

## 2023-04-27 DIAGNOSIS — R350 Frequency of micturition: Secondary | ICD-10-CM

## 2023-04-27 DIAGNOSIS — R948 Abnormal results of function studies of other organs and systems: Secondary | ICD-10-CM | POA: Diagnosis not present

## 2023-04-27 DIAGNOSIS — R151 Fecal smearing: Secondary | ICD-10-CM

## 2023-04-27 DIAGNOSIS — N3941 Urge incontinence: Secondary | ICD-10-CM

## 2023-04-27 LAB — POCT URINALYSIS DIPSTICK
Bilirubin, UA: NEGATIVE
Blood, UA: NEGATIVE
Glucose, UA: NEGATIVE
Ketones, UA: NEGATIVE
Leukocytes, UA: NEGATIVE
Nitrite, UA: NEGATIVE
Protein, UA: NEGATIVE
Spec Grav, UA: 1.02 (ref 1.010–1.025)
Urobilinogen, UA: 0.2 U/dL
pH, UA: 6 (ref 5.0–8.0)

## 2023-04-27 NOTE — Progress Notes (Signed)
 [facility] Urodynamics Procedure  Referring Physician: Karie Georges, MD Date of Procedure: 04/27/2023  Jillian Guerrero is a 78 y.o. female who presents for urodynamic evaluation. Indication(s) for study: mixed incontinence  Vital Signs: BP 124/82   Pulse 93   Laboratory Results: A catheterized urine specimen revealed:  POC urine:  Lab Results  Component Value Date   COLORU Yellow 04/27/2023   CLARITYU Clear 04/27/2023   GLUCOSEUR Negative 04/27/2023   BILIRUBINUR Negative 04/27/2023   KETONESU Negative 04/27/2023   SPECGRAV 1.020 04/27/2023   RBCUR Negative 04/27/2023   PHUR 6.0 04/27/2023   PROTEINUR Negative 04/27/2023   UROBILINOGEN 0.2 04/27/2023   LEUKOCYTESUR Negative 04/27/2023     Voiding Diary: Not done.  Procedure Timeout:  The correct patient was verified and the correct procedure was verified. The patient was in the correct position and safety precautions were reviewed based on at the patient's history.  Urodynamic Procedure A 23F dual lumen urodynamics catheter was placed under sterile conditions into the patient's bladder. A 23F catheter was placed into the rectum in order to measure abdominal pressure. EMG patches were placed in the appropriate position.  All connections were confirmed and calibrations/adjusted made. Saline was instilled into the bladder through the dual lumen catheters.  Cough/valsalva pressures were measured periodically during filling.  Patient was allowed to void.  The bladder was then emptied of its residual.  UROFLOW: Revealed a Qmax of 4.1 mL/sec.  She voided 44 mL and had a residual of 20 mL.  It was a intermittent pattern and represented normal habits though interpretation limited due to low voided volume. The patient was unable to urinate a full amount as she started to feel she was going to have a bowel movement and had to stop  CMG: This was performed with sterile water in the sitting position at a fill rate of  30-40 mL/min.    First sensation of fullness was 196 mLs,  First urge was 205 mLs,  Strong urge was 323 mLs and  Capacity was 632 mLs  Stress incontinence was not demonstrated Highest negative Barrier CLPP was 157 cmH20 at 206 ml. Highest negative Barrier VLPP was 151 cmH20 at 324 ml.  Detrusor function was normal, with phasic contractions seen with standing.  The first occurred at 11 mL to 519 cm of water and was associated with urge.  Compliance:  Normal. End fill detrusor pressure was 0cmH20.    UPP: MUCP with barrier reduction was 143 cm of water.    MICTURITION STUDY: Voiding was performed with reduction using scopettes in the sitting position.  Pdet at Qmax was 0 cm of water.  Qmax was 37 mL/sec.  It was a intermittent pattern.  She voided 622 mL and had a residual of 10 mL.  It was a volitional void, sustained detrusor contraction was not present and abdominal straining was present  EMG: This was performed with patches.  She had voluntary contractions, recruitment with fill was present and urethral sphincter was not relaxed with void.  The details of the procedure with the study tracings have been scanned into EPIC.   Urodynamic Impression:  1. Sensation was reduced; capacity was normal 2. Stress Incontinence was not demonstrated at normal pressures; 3. Detrusor Overactivity was demonstrated without leakage. 4. Emptying was dysfunctional with a normal PVR, a sustained detrusor contraction not present,  abdominal straining present, dyssynergic urethral sphincter activity on EMG.  Plan: - The patient will follow up  to discuss the findings  and treatment options.

## 2023-04-28 ENCOUNTER — Ambulatory Visit: Payer: Medicare Other | Admitting: Obstetrics and Gynecology

## 2023-05-04 ENCOUNTER — Ambulatory Visit: Payer: Medicare Other | Admitting: Obstetrics and Gynecology

## 2023-05-05 ENCOUNTER — Ambulatory Visit: Payer: Medicare Other | Admitting: Obstetrics and Gynecology

## 2023-05-12 ENCOUNTER — Ambulatory Visit (INDEPENDENT_AMBULATORY_CARE_PROVIDER_SITE_OTHER): Payer: Medicare Other | Admitting: Obstetrics and Gynecology

## 2023-05-12 VITALS — BP 126/77 | HR 79

## 2023-05-12 DIAGNOSIS — N3281 Overactive bladder: Secondary | ICD-10-CM | POA: Diagnosis not present

## 2023-05-12 DIAGNOSIS — R350 Frequency of micturition: Secondary | ICD-10-CM

## 2023-05-12 DIAGNOSIS — N3941 Urge incontinence: Secondary | ICD-10-CM

## 2023-05-12 NOTE — Progress Notes (Signed)
 [facility]  PTNS VISIT  CC:  Overactive bladder  78 y.o. with refractory overactive bladder who presents for percutaneous tibial nerve stimulation. The patient presents for PTNS session # 1.   Procedure: The patient was placed in the sitting position and the left lower extremity was prepped in the usual fashion. The PTNS needle was then inserted at a 60 degree angle, 5 cm cephalad and 2 cm posterior to the medial malleolus. The PTNS unit was then programmed and an optimal response was noted at 12 milliamps. The PTNS stimulation was then performed at this setting for 30 minutes without incident and the patient tolerated the procedure well. The needle was removed and hemostasis was noted.   The pt will return in 1 week for PTNS session # 2. All questions were answered.

## 2023-05-16 ENCOUNTER — Ambulatory Visit: Payer: Medicare Other | Admitting: Obstetrics and Gynecology

## 2023-05-19 ENCOUNTER — Ambulatory Visit (INDEPENDENT_AMBULATORY_CARE_PROVIDER_SITE_OTHER): Payer: Medicare Other | Admitting: Obstetrics and Gynecology

## 2023-05-19 ENCOUNTER — Encounter: Payer: Self-pay | Admitting: Obstetrics and Gynecology

## 2023-05-19 VITALS — BP 109/60 | HR 80

## 2023-05-19 DIAGNOSIS — N3281 Overactive bladder: Secondary | ICD-10-CM

## 2023-05-19 DIAGNOSIS — R350 Frequency of micturition: Secondary | ICD-10-CM

## 2023-05-19 MED ORDER — MIRABEGRON ER 50 MG PO TB24: 50.0000 mg | ORAL_TABLET | Freq: Every day | ORAL | 5 refills | Status: DC

## 2023-05-19 NOTE — Progress Notes (Signed)
 [facility]  PTNS VISIT  CC:  Overactive bladder  78 y.o. with refractory overactive bladder who presents for percutaneous tibial nerve stimulation. The patient presents for PTNS session # 2.   Procedure: The patient was placed in the sitting position and the left lower extremity was prepped in the usual fashion. The PTNS needle was then inserted at a 60 degree angle, 5 cm cephalad and 2 cm posterior to the medial malleolus. The PTNS unit was then programmed and an optimal response was noted at 4 milliamps. The PTNS stimulation was then performed at this setting for 30 minutes without incident and the patient tolerated the procedure well. The needle was removed and hemostasis was noted.   The pt will return in 1 week for PTNS session # 3. All questions were answered.

## 2023-05-19 NOTE — Patient Instructions (Addendum)
 Please try and increase weight bearing exercises to increase your muscle mass. Your legs are losing muscle tone and are very sensitive.   Use weights on your wrist and ankles when walking. Even doing 5-10lbs and doing leg kicks when watching TV can be helpful.   Please increase the Myrbetriq to 50mg  to see if that helps your leakage.

## 2023-05-23 ENCOUNTER — Encounter: Payer: Self-pay | Admitting: Obstetrics

## 2023-05-23 ENCOUNTER — Ambulatory Visit (INDEPENDENT_AMBULATORY_CARE_PROVIDER_SITE_OTHER): Payer: Medicare Other | Admitting: Obstetrics

## 2023-05-23 VITALS — BP 115/72 | HR 101

## 2023-05-23 DIAGNOSIS — N3281 Overactive bladder: Secondary | ICD-10-CM | POA: Insufficient documentation

## 2023-05-23 DIAGNOSIS — R151 Fecal smearing: Secondary | ICD-10-CM | POA: Diagnosis not present

## 2023-05-23 DIAGNOSIS — Z87440 Personal history of urinary (tract) infections: Secondary | ICD-10-CM

## 2023-05-23 DIAGNOSIS — N952 Postmenopausal atrophic vaginitis: Secondary | ICD-10-CM | POA: Insufficient documentation

## 2023-05-23 DIAGNOSIS — N390 Urinary tract infection, site not specified: Secondary | ICD-10-CM | POA: Insufficient documentation

## 2023-05-23 NOTE — Assessment & Plan Note (Addendum)
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 Discussed need for stool bulking, however can start with pelvic floor muscle training to treat pelvic floor dyssynergic which can lead to slow transit constipation. - encouraged pelvic floor relaxation and squatting for bowel movements to improve bowel emptying - Treatment options of accidental bowel leakage include anti-diarrhea medication (loperamide/ Imodium OTC or prescription lomotil), fiber supplements, physical therapy, and possible sacral neuromodulation or surgery.

## 2023-05-23 NOTE — Assessment & Plan Note (Signed)
-   For symptomatic vaginal atrophy options include lubrication with a water-based lubricant, personal hygiene measures and barrier protection against wetness, and estrogen replacement in the form of vaginal cream, vaginal tablets, or a time-released vaginal ring.   - continue low dose vaginal estrogen

## 2023-05-23 NOTE — Assessment & Plan Note (Signed)
-   denies UTI symptoms today - continue low dose vaginal estrogen - on macrobid daily for suppression, discontinue after 3 months of vaginal estrogen to reassess symptoms

## 2023-05-23 NOTE — Patient Instructions (Addendum)
 Monitor your blood pressure at home and record on diary to review with your primary care provider.   Continue PTNS.   Please call [phone] to schedule the earliest appointment for pelvic floor PT.

## 2023-05-23 NOTE — Assessment & Plan Note (Signed)
-   We discussed the symptoms of overactive bladder (OAB), which include urinary urgency, urinary frequency, nocturia, with or without urge incontinence.  While we do not know the exact etiology of OAB, several treatment options exist. We discussed management including behavioral therapy (decreasing bladder irritants, urge suppression strategies, timed voids, bladder retraining), physical therapy, medication; for refractory cases posterior tibial nerve stimulation, sacral neuromodulation, and intravesical botulinum toxin injection.  For anticholinergic medications, we discussed the potential side effects of anticholinergics including dry eyes, dry mouth, constipation, cognitive impairment and urinary retention. For Beta-3 agonist medication, we discussed the potential side effect of elevated blood pressure which is more likely to occur in individuals with uncontrolled hypertension. - continue PTNS - continue mirabegron 25mg  due to relief, BP WNL on repeat - increase to 50mg  in 3 weeks

## 2023-05-23 NOTE — Progress Notes (Signed)
 Jillian Guerrero Return Visit  SUBJECTIVE  History of Present Illness: Lakin Basilia Stuckert is a 78 y.o. female seen in follow-up for mixed urinary incontinence and recurrent UTI. Plan at last visit was continue PTNS, start mirabegron, and review treatment options for fecal incontinence.   Most bothered by bowel symptoms Constipation with fecal smearing, BM irregularly started 15 years ago. Sometimes 4-5 days without BM followed by 4-5x/day for a few days. [place] IV or VI-VII stool.  Reports increased bowel frequency 10x/day with vitamins, fiber supplementation, miralax, cutting vegetables.  History of lumbar compression fracture and peripheral neuropathy  Start mirabegron 25mg , pending increase to 50mg  with Rx. Reports decreased urgency and able to get to the bathroom Denies sick contact, N/V/D, lightheadedness or syncope.  Leaks urine 1-2x/day (baseline 1-3x/day) Pad use: 5-6 pads/week down from 2 pads/day Nocturia: 1-3 times per night due to drinking at night Tried PTNS x 2, PFPT Possible fascial sling in [place] in 2003 Former tobacco use with 40 pack years Drinks: 1 cup coffee AM, 32-48oz water and drinks water at night, sometimes does juice that has been watered down per day  Continues nitrofurantoin 50mg  for history of rUTI Denies vaginal bulge symptoms  Urodynamic Impression 04/27/23:  1. Sensation was reduced; capacity was normal 2. Stress Incontinence was not demonstrated at normal pressures; 3. Detrusor Overactivity was demonstrated without leakage. 4. Emptying was dysfunctional with a normal PVR, a sustained detrusor contraction not present,  abdominal straining present, dyssynergic urethral sphincter activity on EMG.  Past Medical History: Patient  has a past medical history of Asthma, GERD (gastroesophageal reflux disease), Hyperlipidemia, Lumbar compression fracture (HCC), Macular degeneration, wet (HCC), Osteopenia, Osteoporosis, and Peripheral neuropathy.   Past  Surgical History: She  has a past surgical history that includes Bladder suspension (2003); Breast biopsy (2015); Appendectomy (1956); Tonsillectomy (1964); Kyphoplasty (10/2018); Upper gi endoscopy (03/08/2019); hyaluronic acid viscosupplementation (Bilateral); and IR Radiologist Eval & Mgmt (01/21/2021).   Medications: She has a current medication list which includes the following prescription(s): alendronate, atorvastatin, calcium citrate-vitamin d, cetirizine, estradiol, gabapentin, mirabegron er, nitrofurantoin, nystatin ointment, pantoprazole, tizanidine, and valacyclovir.   Allergies: Patient is allergic to ciprofloxacin and collagen.   Social History: Patient  reports that she has quit smoking. Her smoking use included cigarettes. She has a 40 pack-year smoking history. She has never used smokeless tobacco. She reports that she does not drink alcohol and does not use drugs.     OBJECTIVE     Physical Exam: Vitals:   05/23/23 1348 05/23/23 1514  BP: (!) 87/61 115/72  Pulse: (!) 101    Gen: No apparent distress, A&O x 3.       ASSESSMENT AND PLAN    Ms. Fossett is a 78 y.o. with:  1. OAB (overactive bladder)   2. Fecal smearing   3. Vaginal atrophy   4. Recurrent UTI     OAB (overactive bladder) Assessment & Plan: - We discussed the symptoms of overactive bladder (OAB), which include urinary urgency, urinary frequency, nocturia, with or without urge incontinence.  While we do not know the exact etiology of OAB, several treatment options exist. We discussed management including behavioral therapy (decreasing bladder irritants, urge suppression strategies, timed voids, bladder retraining), physical therapy, medication; for refractory cases posterior tibial nerve stimulation, sacral neuromodulation, and intravesical botulinum toxin injection.  For anticholinergic medications, we discussed the potential side effects of anticholinergics including dry eyes, dry mouth, constipation,  cognitive impairment and urinary retention. For Beta-3 agonist medication, we discussed  the potential side effect of elevated blood pressure which is more likely to occur in individuals with uncontrolled hypertension. 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 Discussed need for stool bulking, however can start with pelvic floor muscle training to treat pelvic floor dyssynergic which can lead to slow transit constipation. - encouraged pelvic floor relaxation and squatting for bowel movements to improve bowel emptying - Treatment options of accidental bowel leakage include anti-diarrhea medication (loperamide/ Imodium OTC or prescription lomotil), fiber supplements, physical therapy, and possible sacral neuromodulation or surgery.    Orders: -     AMB referral to rehabilitation  Vaginal atrophy Assessment & Plan: - For symptomatic vaginal atrophy options include lubrication with a water-based lubricant, personal hygiene measures and barrier protection against wetness, and estrogen replacement in the form of vaginal cream, vaginal tablets, or a time-released vaginal ring.   - continue low dose vaginal estrogen   Recurrent UTI Assessment & Plan: - denies UTI symptoms today - continue low dose vaginal estrogen - on macrobid daily for suppression, discontinue after 3 months of vaginal estrogen to reassess symptoms   Time spent:  I spent 59 minutes dedicated to the care of this patient on the date of this encounter to include pre-visit review of records, face-to-face time with the patient discussing recurrent UTI, fecal smearing, OAB, vaginal atrophy, and post visit documentation and ordering medication/ testing.    Loleta Chance, MD

## 2023-05-26 ENCOUNTER — Ambulatory Visit (INDEPENDENT_AMBULATORY_CARE_PROVIDER_SITE_OTHER): Payer: Medicare Other | Admitting: Obstetrics and Gynecology

## 2023-05-26 ENCOUNTER — Encounter: Payer: Self-pay | Admitting: Obstetrics and Gynecology

## 2023-05-26 VITALS — BP 102/71 | HR 83

## 2023-05-26 DIAGNOSIS — R350 Frequency of micturition: Secondary | ICD-10-CM

## 2023-05-26 DIAGNOSIS — N3281 Overactive bladder: Secondary | ICD-10-CM | POA: Diagnosis not present

## 2023-05-26 NOTE — Progress Notes (Signed)
 [facility]  PTNS VISIT  CC:  Overactive bladder  78 y.o. with refractory overactive bladder who presents for percutaneous tibial nerve stimulation. The patient presents for PTNS session # 3.   Procedure: The patient was placed in the sitting position and the left lower extremity was prepped in the usual fashion. The PTNS needle was then inserted at a 60 degree angle, 5 cm cephalad and 2 cm posterior to the medial malleolus. The PTNS unit was then programmed and an optimal response was noted at 10 milliamps. The PTNS stimulation was then performed at this setting for 30 minutes without incident and the patient tolerated the procedure well. The needle was removed and hemostasis was noted.   The pt will return in 1 week for PTNS session # 4. All questions were answered.

## 2023-06-01 NOTE — Progress Notes (Unsigned)
 [facility]  PTNS VISIT  CC:  Overactive bladder  78 y.o. with refractory overactive bladder who presents for percutaneous tibial nerve stimulation. The patient presents for PTNS session # 4.  Patient endorses that she has had swelling in her hands starting last week and that she is also having joint pain in her bilateral ankles. She is concerned the Myrbetriq 25mg  is causing this and has stopped it. She endorses some mild right sided back pain as well. Patient denies new foods, laundry detergent, possible mold or other reasons for swelling/joint pain.   Patient reports she has been able to hold her stool more recently with the use of the squatty potty and the PTNS. She feels like it is really helping her.    Procedure: The patient was placed in the sitting position and the left lower extremity was prepped in the usual fashion. The PTNS needle was then inserted at a 60 degree angle, 5 cm cephalad and 2 cm posterior to the medial malleolus. The PTNS unit was then programmed and an optimal response was noted at 8 milliamps. The PTNS stimulation was then performed at this setting for 30 minutes without incident and the patient tolerated the procedure well. The needle was removed and hemostasis was noted.     Urine today shown below Lab Results  Component Value Date   COLORU yellow 06/02/2023   CLARITYU clear 06/02/2023   GLUCOSEUR Negative 06/02/2023   BILIRUBINUR negative'negaive 06/02/2023   KETONESU negative 06/02/2023   SPECGRAV 1.025 06/02/2023   RBCUR Trace intact 06/02/2023   PHUR 5.5 06/02/2023   PROTEINUR Negative 06/02/2023   UROBILINOGEN 0.2 06/02/2023   LEUKOCYTESUR Negative 06/02/2023   Will send urine for culture and micro due to blood noted in urine.    The pt will return in 1 week for PTNS session # 5. All questions were answered.

## 2023-06-02 ENCOUNTER — Other Ambulatory Visit (HOSPITAL_COMMUNITY)
Admission: RE | Admit: 2023-06-02 | Discharge: 2023-06-02 | Disposition: A | Discharge status: Home / Self Care | Attending: Obstetrics and Gynecology | Admitting: Obstetrics and Gynecology

## 2023-06-02 ENCOUNTER — Ambulatory Visit (INDEPENDENT_AMBULATORY_CARE_PROVIDER_SITE_OTHER): Payer: Medicare Other | Admitting: Obstetrics and Gynecology

## 2023-06-02 VITALS — BP 111/71 | HR 81

## 2023-06-02 DIAGNOSIS — R319 Hematuria, unspecified: Secondary | ICD-10-CM | POA: Insufficient documentation

## 2023-06-02 DIAGNOSIS — N3281 Overactive bladder: Secondary | ICD-10-CM | POA: Diagnosis not present

## 2023-06-02 DIAGNOSIS — R350 Frequency of micturition: Secondary | ICD-10-CM

## 2023-06-02 DIAGNOSIS — M545 Low back pain, unspecified: Secondary | ICD-10-CM

## 2023-06-02 LAB — URINALYSIS, ROUTINE W REFLEX MICROSCOPIC
Bilirubin Urine: NEGATIVE
Glucose, UA: NEGATIVE mg/dL
Hgb urine dipstick: NEGATIVE
Ketones, ur: NEGATIVE mg/dL
Leukocytes,Ua: NEGATIVE
Nitrite: NEGATIVE
Protein, ur: NEGATIVE mg/dL
Specific Gravity, Urine: >1.03 — ABNORMAL HIGH (ref 1.005–1.030)
pH: 6 (ref 5.0–8.0)

## 2023-06-02 LAB — POCT URINALYSIS DIPSTICK
Bilirubin, UA: NEGATIVE
Glucose, UA: NEGATIVE
Ketones, UA: NEGATIVE
Leukocytes, UA: NEGATIVE
Nitrite, UA: NEGATIVE
Protein, UA: NEGATIVE
Spec Grav, UA: 1.025 (ref 1.010–1.025)
Urobilinogen, UA: 0.2 U/dL
pH, UA: 5.5 (ref 5.0–8.0)

## 2023-06-06 ENCOUNTER — Ambulatory Visit: Payer: Self-pay

## 2023-06-06 NOTE — Telephone Encounter (Signed)
 Noted- ok to close.

## 2023-06-06 NOTE — Telephone Encounter (Signed)
  Chief Complaint: hand and feet swelling Symptoms: bilateral fingers and toes swelling/mild to moderate pain Frequency: worsening x 10 days; worse in the morning Pertinent Negatives: Patient denies fever, redness, chest pain, SOB. Disposition: [] ED /[] Urgent Care (no appt availability in office) / [x] Appointment(In office/virtual)/ []  [facility]/ [] Home Care/ [] Refused Recommended Disposition /[] [facility]/ []  Follow-up with PCP Additional Notes: No available appts with PCP. Patient agreeable to acute visit tomorrow morning with Dr [place].  Copied from CRM [id number]. Topic: Clinical - Red Word Triage >> Jun 06, 2023 12:03 PM Thliyah D wrote: Patient is having lots of swelling in her hands she can barely bend them and says it's very painful Reason for Disposition  [1] MILD swelling (puffiness) of both hands AND [2] new-onset or worsening  (Exception: Caused by hot weather or normal pregnancy swelling.)  Answer Assessment - Initial Assessment Questions 1. ONSET: "When did the swelling start?" (e.g., minutes, hours, days)     10 days.  2. LOCATION: "What part of the hand is swollen?"  "Are both hands swollen or just one hand?"     Bilateral hands. Fingers and joints are swollen.  3. SEVERITY: "How bad is the swelling?" (e.g., localized; mild, moderate, severe)   - BALL OR LUMP: small ball or lump   - LOCALIZED: puffy or swollen area or patch of skin   - JOINT SWELLING: swelling of a joint   - MILD: puffiness or mild swelling of fingers or hand   - MODERATE: fingers and hand are swollen   - SEVERE: swelling of entire hand and up into forearm     Patient states she has difficulty gripping her hands, mild to moderate.  4. REDNESS: "Does the swelling look red or infected?"     Denies.  5. PAIN: "Is the swelling painful to touch?" If Yes, ask: "How painful is it?"   (Scale 1-10; mild, moderate or severe)     Yes, mild-moderate.  6. FEVER: "Do you have a fever?" If  Yes, ask: "What is it, how was it measured, and when did it start?"      Denies.  7. CAUSE: "What do you think is causing the hand swelling?" (e.g., heat, insect bite, pregnancy, recent injury)     Patient thought it might have been from a medication her Gyn/Urologist put her on about a month ago but states the doctor told her it is unrelated as she has been off the medication for 10 days now.  8. MEDICAL HISTORY: "Do you have a history of heart failure, kidney disease, liver failure, or cancer?"     No.  9. RECURRENT SYMPTOM: "Have you had hand swelling before?" If Yes, ask: "When was the last time?" "What happened that time?"     Patient states she usually has swelling in the mornings but it is not this bad. She states it used to resolve or improve within an hour.  10. OTHER SYMPTOMS: "Do you have any other symptoms?" (e.g., blurred vision, difficulty breathing, headache)       She states also in the morning all her toes are swollen.  11. PREGNANCY: "Is there any chance you are pregnant?" "When was your last menstrual period?"       N/A.  Protocols used: Hand Swelling-A-AH

## 2023-06-06 NOTE — Progress Notes (Incomplete)
 ACUTE VISIT No chief complaint on file.  HPI: JillianJillian Guerrero is a 78 y.o. female with a PMHx significant for asthma, GERD, peripheral neuropathy, osteoporosis, vitamin D deficiency, HLD, and macular degeneration, among some, who is here today complaining of swelling in her hands and feet.   Review of Systems See other pertinent positives and negatives in HPI.  Current Outpatient Medications on File Prior to Visit  Medication Sig Dispense Refill   alendronate (FOSAMAX) 70 MG tablet TAKE 1 TABLET EVERY 7 DAYS - TAKE WITH A FULL GLASS OF WATER ON AN EMPTY STOMACH. 12 tablet 3   atorvastatin (LIPITOR) 10 MG tablet TAKE 1 TABLET EVERY DAY (NEED MD APPOINTMENT) 90 tablet 1   calcium citrate-vitamin D (CITRACAL+D) 315-200 MG-UNIT tablet Take 1 tablet by mouth 2 (two) times daily.     cetirizine (ZYRTEC) 10 MG tablet Take 10 mg by mouth daily.     estradiol (ESTRACE) 0.1 MG/GM vaginal cream Place 0.5 g vaginally 2 (two) times a week. Place 0.5g nightly for two weeks then twice a week after 42.5 g 11   gabapentin (NEURONTIN) 600 MG tablet TAKE 1 TABLET TWICE DAILY 180 tablet 1   mirabegron ER (MYRBETRIQ) 50 MG TB24 tablet Take 1 tablet (50 mg total) by mouth daily. 30 tablet 5   nitrofurantoin (MACRODANTIN) 50 MG capsule Take 1 capsule (50 mg total) by mouth at bedtime. 90 capsule 1   nystatin ointment (MYCOSTATIN) Apply 1 Application topically 2 (two) times daily. 30 g 0   pantoprazole (PROTONIX) 40 MG tablet TAKE 1 TABLET EVERY DAY (NEED MD APPOINTMENT) 90 tablet 1   tiZANidine (ZANAFLEX) 4 MG tablet TAKE 1 TABLET AT BEDTIME 90 tablet 3   valACYclovir (VALTREX) 1000 MG tablet TAKE 1 TABLET EVERY DAY (NEED MD APPOINTMENT) 90 tablet 1   No current facility-administered medications on file prior to visit.    Past Medical History:  Diagnosis Date   Asthma    GERD (gastroesophageal reflux disease)    Hyperlipidemia    Lumbar compression fracture (HCC)    Macular degeneration, wet (HCC)     both eyes   Osteopenia    Osteoporosis    Peripheral neuropathy    Allergies  Allergen Reactions   Ciprofloxacin Swelling   Collagen     Redness, swelling    Social History   Socioeconomic History   Marital status: Widowed    Spouse name: Not on file   Number of children: 2   Years of education: Not on file   Highest education level: 12th grade  Occupational History   Occupation: retired  Tobacco Use   Smoking status: Former    Current packs/day: 1.00    Average packs/day: 1 pack/day for 40.0 years (40.0 ttl pk-yrs)    Types: Cigarettes   Smokeless tobacco: Never   Tobacco comments:    Quit in 2002  Vaping Use   Vaping status: Never Used  Substance and Sexual Activity   Alcohol use: Never   Drug use: Never   Sexual activity: Not Currently    Birth control/protection: Coitus interruptus  Other Topics Concern   Not on file  Social History Narrative   Right handed    Lives alone    Social Drivers of Health   Financial Resource Strain: Low Risk  (02/07/2023)   Overall Financial Resource Strain (CARDIA)    Difficulty of Paying Living Expenses: Not hard at all  Food Insecurity: No Food Insecurity (02/07/2023)   Hunger Vital Sign  Worried About Programme researcher, broadcasting/film/video in the Last Year: Never true    Ran Out of Food in the Last Year: Never true  Transportation Needs: No Transportation Needs (02/07/2023)   PRAPARE - Administrator, Civil Service (Medical): No    Lack of Transportation (Non-Medical): No  Physical Activity: Sufficiently Active (02/07/2023)   Exercise Vital Sign    Days of Exercise per Week: 5 days    Minutes of Exercise per Session: 60 min  Stress: No Stress Concern Present (02/07/2023)   [employer] of Occupational Health - Occupational Stress Questionnaire    Feeling of Stress : Not at all  Social Connections: Moderately Integrated (02/07/2023)   Social Connection and Isolation Panel [NHANES]    Frequency of Communication with  Friends and Family: More than three times a week    Frequency of Social Gatherings with Friends and Family: More than three times a week    Attends Religious Services: More than 4 times per year    Active Member of [employer] or Organizations: No    Attends Engineer, structural: More than 4 times per year    Marital Status: Widowed    There were no vitals filed for this visit. There is no height or weight on file to calculate BMI.  Physical Exam  ASSESSMENT AND PLAN:  Jillian Guerrero was seen today for swelling in her hands and feet.   There are no diagnoses linked to this encounter.  No follow-ups on file.  I, Jillian Guerrero, acting as a scribe for Jillian [place], MD., have documented all relevant documentation on the behalf of Jillian [place], MD, as directed by  Jillian [place], MD while in the presence of Jillian [place], MD.   I, Jillian [place], MD, have reviewed all documentation for this visit. The documentation on 06/06/23 for the exam, diagnosis, procedures, and orders are all accurate and complete.  Jillian G. [place], MD  [facility]. [place] office.  Discharge Instructions   None

## 2023-06-07 ENCOUNTER — Encounter: Payer: Self-pay | Admitting: Family Medicine

## 2023-06-07 ENCOUNTER — Ambulatory Visit (INDEPENDENT_AMBULATORY_CARE_PROVIDER_SITE_OTHER): Admitting: Family Medicine

## 2023-06-07 ENCOUNTER — Ambulatory Visit: Admitting: Family Medicine

## 2023-06-07 VITALS — BP 130/86 | HR 85 | Temp 97.9°F | Resp 16 | Ht 66.0 in | Wt 203.0 lb

## 2023-06-07 DIAGNOSIS — G609 Hereditary and idiopathic neuropathy, unspecified: Secondary | ICD-10-CM

## 2023-06-07 DIAGNOSIS — E538 Deficiency of other specified B group vitamins: Secondary | ICD-10-CM | POA: Diagnosis not present

## 2023-06-07 DIAGNOSIS — M2550 Pain in unspecified joint: Secondary | ICD-10-CM | POA: Diagnosis not present

## 2023-06-07 DIAGNOSIS — R7309 Other abnormal glucose: Secondary | ICD-10-CM

## 2023-06-07 LAB — BASIC METABOLIC PANEL WITH GFR
BUN: 17 mg/dL (ref 6–23)
CO2: 26 meq/L (ref 19–32)
Calcium: 9.1 mg/dL (ref 8.4–10.5)
Chloride: 107 meq/L (ref 96–112)
Creatinine, Ser: 0.75 mg/dL (ref 0.40–1.20)
GFR: 76.21 mL/min (ref >60.00)
Glucose, Bld: 98 mg/dL (ref 70–99)
Potassium: 3.7 meq/L (ref 3.5–5.1)
Sodium: 143 meq/L (ref 135–145)

## 2023-06-07 LAB — C-REACTIVE PROTEIN: CRP: <1 mg/dL (ref 0.5–20.0)

## 2023-06-07 LAB — CBC
HCT: 41.2 % (ref 36.0–46.0)
Hemoglobin: 14 g/dL (ref 12.0–15.0)
MCHC: 34 g/dL (ref 30.0–36.0)
MCV: 92.6 fl (ref 78.0–100.0)
Platelets: 193 10*3/uL (ref 150.0–400.0)
RBC: 4.45 Mil/uL (ref 3.87–5.11)
RDW: 13.6 % (ref 11.5–15.5)
WBC: 4.1 10*3/uL (ref 4.0–10.5)

## 2023-06-07 LAB — VITAMIN B12: Vitamin B-12: 268 pg/mL (ref 211–911)

## 2023-06-07 LAB — HEMOGLOBIN A1C: Hgb A1c MFr Bld: 5.8 % (ref 4.6–6.5)

## 2023-06-07 MED ORDER — MELOXICAM 7.5 MG PO TABS: 7.5000 mg | ORAL_TABLET | Freq: Every day | ORAL | 0 refills | Status: AC

## 2023-06-07 NOTE — Patient Instructions (Addendum)
 A few things to remember from today's visit:  Polyarthralgia - Plan: meloxicam (MOBIC) 7.5 MG tablet, C-reactive protein, Basic metabolic panel with GFR  Idiopathic peripheral neuropathy  B12 deficiency - Plan: Vitamin B12, CBC, Basic metabolic panel with GFR Continue Gabapentin. Start Meloxicam 1-2 tabs daily for 7-10 days then as needed. No over the counter medication.  If you need refills for medications you take chronically, please call your pharmacy. Do not use My Chart to request refills or for acute issues that need immediate attention. If you send a my chart message, it may take a few days to be addressed, specially if I am not in the office.  Please be sure medication list is accurate. If a new problem present, please set up appointment sooner than planned today.

## 2023-06-07 NOTE — Progress Notes (Signed)
 ACUTE VISIT Chief Complaint  Patient presents with   Hand Pain    x10d   Toe Pain   HPI: [name] is a 78 y.o. female with a PMHx significant for chronic rhinitis, asthma, GERD, HLD, and vitamin D deficiency, who is here today complaining of hand and foot pain.   Associated joint swelling for about 10 days. She says the pain worsens throughout the day and interferes with manual activities. This is a new problem for her.  Also endorses some tingling in her feet that started about 10 days ago. She has a hx of neuropathy, for which she takes gabapentin 600 mg.  States that she was evaluated by neurologist and that her neuropathy was resolved.  She mentions she had recently started taking Mirabegron ER 50 mg prescribed by her urology-gynecology provider for overactive bladder. She stopped taking it 2 days after her symptoms started because she thought it was potentially contributing to the pain but did not help. She also receives regular percutaneous tibial nerve stimulation treatments for overactive bladder. She has not been taking anything for the pain.  No hx of kidney disease.  She is right-handed.  Pertinent negatives include hand numbness chest pain, SOB, or palpitations.   B12 deficiency: Not taking B12 supplementation.  Lab Results  Component Value Date   VITAMINB12 300 11/10/2021   Glucose has been elevated in the past, no history of diabetes.  Lab Results  Component Value Date   NA 141 05/04/2022   CL 104 05/04/2022   K 4.0 05/04/2022   CO2 27 05/04/2022   BUN 14 05/04/2022   CREATININE 0.67 05/04/2022   GFR 84.31 05/04/2022   CALCIUM 9.8 05/04/2022   ALBUMIN 4.2 05/04/2022   GLUCOSE 125 (H) 05/04/2022   Lab Results  Component Value Date   WBC 12.0 (H) 05/04/2022   HGB 14.3 05/04/2022   HCT 42.0 05/04/2022   MCV 91.9 05/04/2022   PLT 238.0 05/04/2022   Review of Systems  Constitutional:  Negative for activity change, appetite change, chills and  fever.  HENT:  Negative for mouth sores and sore throat.   Respiratory:  Negative for cough and wheezing.   Gastrointestinal:  Negative for abdominal pain, nausea and vomiting.  Endocrine: Negative for cold intolerance and heat intolerance.  Genitourinary:  Negative for decreased urine volume, dysuria and hematuria.  Skin:  Negative for rash.  Neurological:  Negative for syncope, facial asymmetry and weakness.  See other pertinent positives and negatives in HPI.  Current Outpatient Medications on File Prior to Visit  Medication Sig Dispense Refill   alendronate (FOSAMAX) 70 MG tablet TAKE 1 TABLET EVERY 7 DAYS - TAKE WITH A FULL GLASS OF WATER ON AN EMPTY STOMACH. 12 tablet 3   atorvastatin (LIPITOR) 10 MG tablet TAKE 1 TABLET EVERY DAY (NEED MD APPOINTMENT) 90 tablet 1   calcium citrate-vitamin D (CITRACAL+D) 315-200 MG-UNIT tablet Take 1 tablet by mouth 2 (two) times daily.     cetirizine (ZYRTEC) 10 MG tablet Take 10 mg by mouth daily.     estradiol (ESTRACE) 0.1 MG/GM vaginal cream Place 0.5 g vaginally 2 (two) times a week. Place 0.5g nightly for two weeks then twice a week after 42.5 g 11   gabapentin (NEURONTIN) 600 MG tablet TAKE 1 TABLET TWICE DAILY 180 tablet 1   mirabegron ER (MYRBETRIQ) 50 MG TB24 tablet Take 1 tablet (50 mg total) by mouth daily. 30 tablet 5   nitrofurantoin (MACRODANTIN) 50 MG capsule Take  1 capsule (50 mg total) by mouth at bedtime. 90 capsule 1   nystatin ointment (MYCOSTATIN) Apply 1 Application topically 2 (two) times daily. 30 g 0   pantoprazole (PROTONIX) 40 MG tablet TAKE 1 TABLET EVERY DAY (NEED MD APPOINTMENT) 90 tablet 1   tiZANidine (ZANAFLEX) 4 MG tablet TAKE 1 TABLET AT BEDTIME 90 tablet 3   valACYclovir (VALTREX) 1000 MG tablet TAKE 1 TABLET EVERY DAY (NEED MD APPOINTMENT) 90 tablet 1   No current facility-administered medications on file prior to visit.    Past Medical History:  Diagnosis Date   Asthma    GERD (gastroesophageal reflux  disease)    Hyperlipidemia    Lumbar compression fracture (HCC)    Macular degeneration, wet (HCC)    both eyes   Osteopenia    Osteoporosis    Peripheral neuropathy    Allergies  Allergen Reactions   Ciprofloxacin Swelling   Collagen     Redness, swelling    Social History   Socioeconomic History   Marital status: Widowed    Spouse name: Not on file   Number of children: 2   Years of education: Not on file   Highest education level: 12th grade  Occupational History   Occupation: retired  Tobacco Use   Smoking status: Former    Current packs/day: 1.00    Average packs/day: 1 pack/day for 40.0 years (40.0 ttl pk-yrs)    Types: Cigarettes   Smokeless tobacco: Never   Tobacco comments:    Quit in 2002  Vaping Use   Vaping status: Never Used  Substance and Sexual Activity   Alcohol use: Never   Drug use: Never   Sexual activity: Not Currently    Birth control/protection: Coitus interruptus  Other Topics Concern   Not on file  Social History Narrative   Right handed    Lives alone    Social Drivers of Health   Financial Resource Strain: Low Risk  (02/07/2023)   Overall Financial Resource Strain (CARDIA)    Difficulty of Paying Living Expenses: Not hard at all  Food Insecurity: No Food Insecurity (02/07/2023)   Hunger Vital Sign    Worried About Running Out of Food in the Last Year: Never true    Ran Out of Food in the Last Year: Never true  Transportation Needs: No Transportation Needs (02/07/2023)   PRAPARE - Administrator, Civil Service (Medical): No    Lack of Transportation (Non-Medical): No  Physical Activity: Sufficiently Active (02/07/2023)   Exercise Vital Sign    Days of Exercise per Week: 5 days    Minutes of Exercise per Session: 60 min  Stress: No Stress Concern Present (02/07/2023)   [employer] of Occupational Health - Occupational Stress Questionnaire    Feeling of Stress : Not at all  Social Connections: Moderately Integrated  (02/07/2023)   Social Connection and Isolation Panel [NHANES]    Frequency of Communication with Friends and Family: More than three times a week    Frequency of Social Gatherings with Friends and Family: More than three times a week    Attends Religious Services: More than 4 times per year    Active Member of [employer] or Organizations: No    Attends Engineer, structural: More than 4 times per year    Marital Status: Widowed    Vitals:   06/07/23 0829  BP: 130/86  Pulse: 85  Resp: 16  Temp: 97.9 F (36.6 C)  SpO2: 96%  Body mass index is 32.77 kg/m.  Physical Exam Vitals and nursing note reviewed.  Constitutional:      General: She is not in acute distress.    Appearance: She is well-developed.  HENT:     Head: Normocephalic and atraumatic.  Eyes:     Conjunctiva/sclera: Conjunctivae normal.  Cardiovascular:     Rate and Rhythm: Normal rate and regular rhythm.     Pulses:          Dorsalis pedis pulses are 2+ on the right side and 2+ on the left side.     Heart sounds: No murmur heard. Pulmonary:     Effort: Pulmonary effort is normal. No respiratory distress.     Breath sounds: Normal breath sounds.  Musculoskeletal:     Right lower leg: No edema.     Left lower leg: No edema.     Comments: Heberden's node (DIP) and Bouchard's nodes (PIP) on some fingers. No signs of synovitis. Nodular thickness palpated on palmar tendons of 4th and 3rd fingers of both hands   Lymphadenopathy:     Cervical: No cervical adenopathy.  Skin:    General: Skin is warm.     Findings: No erythema or rash.  Neurological:     General: No focal deficit present.     Mental Status: She is alert and oriented to person, place, and time.     Cranial Nerves: No cranial nerve deficit.     Gait: Gait normal.     Comments: Decreased sensation, monofilament right toes.   Psychiatric:        Mood and Affect: Mood is anxious.     Comments: Well groomed, good eye contact.    ASSESSMENT AND  PLAN:  Ms. Stonesifer was seen today for hand and foot swelling and tingling. Lab Results  Component Value Date   CRP <1.0 06/07/2023   Lab Results  Component Value Date   VITAMINB12 268 06/07/2023   Lab Results  Component Value Date   WBC 4.1 06/07/2023   HGB 14.0 06/07/2023   HCT 41.2 06/07/2023   MCV 92.6 06/07/2023   PLT 193.0 06/07/2023   Lab Results  Component Value Date   NA 143 06/07/2023   CL 107 06/07/2023   K 3.7 06/07/2023   CO2 26 06/07/2023   BUN 17 06/07/2023   CREATININE 0.75 06/07/2023   GFR 76.21 06/07/2023   CALCIUM 9.1 06/07/2023   ALBUMIN 4.2 05/04/2022   GLUCOSE 98 06/07/2023   Lab Results  Component Value Date   HGBA1C 5.8 06/07/2023   Polyarthralgia We discussed possible etiologies, most likely OA. Recommend meloxicam 7.5 mg 1 to 2 tablets daily for 7 to 10 days then as needed. Monitor for new symptoms. Further recommendation will be given according to lab results. Follow-up with PCP in 6 weeks, before if needed.  -     Meloxicam; Take 1-2 tablets (7.5-15 mg total) by mouth daily for 10 days.  Dispense: 20 tablet; Refill: 0 -     C-reactive protein; Future -     Basic metabolic panel with GFR; Future  Idiopathic peripheral neuropathy We discussed differential diagnosis,?  Radiculopathy. Currently on gabapentin 600 mg twice daily. Continue appropriate footcare.  B12 deficiency Currently she is not on B12 supplementation. Further recommendation will be given according to B12 result.  -     Vitamin B12; Future -     CBC; Future -     Basic metabolic panel with GFR; Future  Elevated glucose  Has had mildly elevated glucose, 124 in 04/2022.  No history of diabetes. Further recommendation will be given according to hemoglobin A1c result.  -     Hemoglobin A1c; Future  Return in 6 weeks (on 07/19/2023), or if symptoms worsen or fail to improve, for OA with PCP.  I, Rolla Etienne Wierda, acting as a scribe for Levy Wellman [place], MD., have documented  all relevant documentation on the behalf of Lucion Dilger [place], MD, as directed by  Erinn Mendosa [place], MD while in the presence of Tyrrell Stephens [place], MD.   I, Kalman Nylen [place], MD, have reviewed all documentation for this visit. The documentation on 06/07/23 for the exam, diagnosis, procedures, and orders are all accurate and complete.  Levoy Geisen G. [place], MD  [facility]. [place] office.

## 2023-06-10 ENCOUNTER — Ambulatory Visit: Payer: Medicare Other | Admitting: Obstetrics and Gynecology

## 2023-06-10 ENCOUNTER — Encounter: Payer: Self-pay | Admitting: Obstetrics and Gynecology

## 2023-06-10 VITALS — BP 113/71 | HR 76

## 2023-06-10 DIAGNOSIS — N3281 Overactive bladder: Secondary | ICD-10-CM | POA: Diagnosis not present

## 2023-06-10 DIAGNOSIS — R350 Frequency of micturition: Secondary | ICD-10-CM

## 2023-06-10 DIAGNOSIS — N3941 Urge incontinence: Secondary | ICD-10-CM

## 2023-06-10 DIAGNOSIS — R151 Fecal smearing: Secondary | ICD-10-CM

## 2023-06-10 NOTE — Progress Notes (Signed)
 [facility]  PTNS VISIT  CC:  Overactive bladder  78 y.o. with refractory overactive bladder who presents for percutaneous tibial nerve stimulation. The patient presents for PTNS session # 5.   Procedure: The patient was placed in the sitting position and the left lower extremity was prepped in the usual fashion. The PTNS needle was then inserted at a 60 degree angle, 5 cm cephalad and 2 cm posterior to the medial malleolus. The PTNS unit was then programmed and an optimal response was noted at 6 milliamps. The PTNS stimulation was then performed at this setting for 30 minutes without incident and the patient tolerated the procedure well. The needle was removed and hemostasis was noted.    Of note we attempted to do the PTNS on the right leg. This was our second attempt to do it on the left leg and it was not successful. Patient has poor sensation on the right leg vs. The left. Patient's capillary refill to the right toes is also about 3 seconds longer than the left. We went up to 19 on the PTNS stimulator and patient had no sensation, no response to her toes and did not feel the stimulator at all. She is to see her PCP about this and the swelling to her fingers soon.    The pt will return in 1 week for PTNS session # 6. All questions were answered.

## 2023-06-16 ENCOUNTER — Ambulatory Visit (INDEPENDENT_AMBULATORY_CARE_PROVIDER_SITE_OTHER): Payer: Medicare Other | Admitting: Obstetrics and Gynecology

## 2023-06-16 ENCOUNTER — Encounter: Payer: Self-pay | Admitting: Obstetrics and Gynecology

## 2023-06-16 VITALS — BP 113/70 | HR 86

## 2023-06-16 DIAGNOSIS — N3281 Overactive bladder: Secondary | ICD-10-CM | POA: Diagnosis not present

## 2023-06-16 DIAGNOSIS — N3941 Urge incontinence: Secondary | ICD-10-CM

## 2023-06-16 MED ORDER — MIRABEGRON ER 50 MG PO TB24: 50.0000 mg | ORAL_TABLET | Freq: Every day | ORAL | 3 refills | Status: DC

## 2023-06-16 NOTE — Progress Notes (Signed)
 [facility]  PTNS VISIT  CC:  Overactive bladder  78 y.o. with refractory overactive bladder who presents for percutaneous tibial nerve stimulation. The patient presents for PTNS session # 6.   Procedure: The patient was placed in the sitting position and the left lower extremity was prepped in the usual fashion. The PTNS needle was then inserted at a 60 degree angle, 5 cm cephalad and 2 cm posterior to the medial malleolus. The PTNS unit was then programmed and an optimal response was noted at 11 milliamps. The PTNS stimulation was then performed at this setting for 30 minutes without incident and the patient tolerated the procedure well. The needle was removed and hemostasis was noted.    The pt will return in 1 week for PTNS session # 7. All questions were answered.

## 2023-06-23 ENCOUNTER — Ambulatory Visit (INDEPENDENT_AMBULATORY_CARE_PROVIDER_SITE_OTHER): Admitting: Obstetrics and Gynecology

## 2023-06-23 VITALS — BP 105/71 | HR 83

## 2023-06-23 DIAGNOSIS — N3941 Urge incontinence: Secondary | ICD-10-CM

## 2023-06-23 DIAGNOSIS — R151 Fecal smearing: Secondary | ICD-10-CM

## 2023-06-23 DIAGNOSIS — N3281 Overactive bladder: Secondary | ICD-10-CM | POA: Diagnosis not present

## 2023-06-23 NOTE — Progress Notes (Signed)
 [facility]  PTNS VISIT  CC:  Overactive bladder  78 y.o. with refractory overactive bladder who presents for percutaneous tibial nerve stimulation. The patient presents for PTNS session # 7.  Patient reports that since she started PTNS she has only had 2 episodes of FI. She reports her bladder has been less overactive and no leakage has been occurring.    Procedure: The patient was placed in the sitting position and the left lower extremity was prepped in the usual fashion. The PTNS needle was then inserted at a 60 degree angle, 5 cm cephalad and 2 cm posterior to the medial malleolus. The PTNS unit was then programmed and an optimal response was noted at 12 milliamps. The PTNS stimulation was then performed at this setting for 30 minutes without incident and the patient tolerated the procedure well. The needle was removed and hemostasis was noted.   The pt will return in 1 week for PTNS session # 8. All questions were answered.

## 2023-06-29 ENCOUNTER — Other Ambulatory Visit: Payer: Self-pay | Admitting: Family Medicine

## 2023-06-29 DIAGNOSIS — M810 Age-related osteoporosis without current pathological fracture: Secondary | ICD-10-CM

## 2023-06-29 DIAGNOSIS — K219 Gastro-esophageal reflux disease without esophagitis: Secondary | ICD-10-CM

## 2023-06-30 ENCOUNTER — Encounter: Payer: Self-pay | Admitting: Obstetrics and Gynecology

## 2023-06-30 ENCOUNTER — Ambulatory Visit: Admitting: Obstetrics and Gynecology

## 2023-06-30 VITALS — BP 107/70 | HR 73

## 2023-06-30 DIAGNOSIS — N3281 Overactive bladder: Secondary | ICD-10-CM

## 2023-06-30 NOTE — Progress Notes (Signed)
 [facility]  PTNS VISIT  CC:  Overactive bladder  78 y.o. with refractory overactive bladder who presents for percutaneous tibial nerve stimulation. The patient presents for PTNS session # 8.   Procedure: The patient was placed in the sitting position and the left lower extremity was prepped in the usual fashion. The PTNS needle was then inserted at a 60 degree angle, 5 cm cephalad and 2 cm posterior to the medial malleolus. The PTNS unit was then programmed and an optimal response was noted at 9 milliamps. The PTNS stimulation was then performed at this setting for 30 minutes without incident and the patient tolerated the procedure well. The needle was removed and hemostasis was noted.   The pt will return in 1 week for PTNS session # 9. All questions were answered.

## 2023-07-04 ENCOUNTER — Encounter: Payer: Self-pay | Admitting: Neurology

## 2023-07-04 ENCOUNTER — Encounter: Payer: Self-pay | Admitting: Family Medicine

## 2023-07-04 ENCOUNTER — Ambulatory Visit (INDEPENDENT_AMBULATORY_CARE_PROVIDER_SITE_OTHER): Admitting: Family Medicine

## 2023-07-04 VITALS — BP 110/60 | HR 76 | Temp 97.5°F | Ht 66.0 in | Wt 204.7 lb

## 2023-07-04 DIAGNOSIS — M1990 Unspecified osteoarthritis, unspecified site: Secondary | ICD-10-CM

## 2023-07-04 DIAGNOSIS — E782 Mixed hyperlipidemia: Secondary | ICD-10-CM | POA: Diagnosis not present

## 2023-07-04 DIAGNOSIS — Z780 Asymptomatic menopausal state: Secondary | ICD-10-CM

## 2023-07-04 DIAGNOSIS — G609 Hereditary and idiopathic neuropathy, unspecified: Secondary | ICD-10-CM | POA: Diagnosis not present

## 2023-07-04 DIAGNOSIS — M810 Age-related osteoporosis without current pathological fracture: Secondary | ICD-10-CM | POA: Diagnosis not present

## 2023-07-04 LAB — URIC ACID: Uric Acid, Serum: 4.1 mg/dL (ref 2.4–7.0)

## 2023-07-04 LAB — LIPID PANEL
Cholesterol: 148 mg/dL (ref 0–200)
HDL: 53.5 mg/dL (ref >39.00)
LDL Cholesterol: 84 mg/dL (ref 0–99)
NonHDL: 94.92
Total CHOL/HDL Ratio: 3
Triglycerides: 57 mg/dL (ref 0.0–149.0)
VLDL: 11.4 mg/dL (ref 0.0–40.0)

## 2023-07-04 MED ORDER — GABAPENTIN 400 MG PO CAPS: 800.0000 mg | ORAL_CAPSULE | Freq: Two times a day (BID) | ORAL | 1 refills | Status: DC

## 2023-07-04 NOTE — Progress Notes (Signed)
 Established Patient Office Visit  Subjective   Patient ID: Jillian Guerrero, female    DOB: 03/01/1946  Age: 78 y.o. MRN: [id number]  Chief Complaint  Patient presents with   Pain    Pt is here due to swelling in the hands in the morning and appt with swelling in her feet at night. She saw Dr. [place] last month and was given meloxicam  twice daily for 10 days. States that her hands are very tender and swollen, states she cannot take off her rings now like she could before. I reviewed her labs from last month, nothing really to explain her symptoms. She had decreased sensation on the right foot. States that she is wondering if her gabapentin  is no longer working since she has been on it for 16 years.   States that she is seeing the uroGYN and they diagnosed her with OAB. States that she is going there now for nerve stimulation treatments. States she thinks it is getting better with the treatments.  The myrbetric is working for her also.    Current Outpatient Medications  Medication Instructions   alendronate  (FOSAMAX ) 70 MG tablet TAKE 1 TABLET EVERY 7 DAYS - TAKE WITH A FULL GLASS OF WATER ON AN EMPTY STOMACH.   atorvastatin  (LIPITOR) 10 MG tablet TAKE 1 TABLET EVERY DAY (NEED MD APPOINTMENT)   calcium  citrate-vitamin D  (CITRACAL+D) 315-200 MG-UNIT tablet 1 tablet, 2 times daily   cetirizine (ZYRTEC) 10 mg, Daily   estradiol  (ESTRACE ) 0.5 g, Vaginal, 2 times weekly, Place 0.5g nightly for two weeks then twice a week after   gabapentin  (NEURONTIN ) 800 mg, Oral, 2 times daily   mirabegron  ER (MYRBETRIQ ) 50 mg, Oral, Daily   nitrofurantoin  (MACRODANTIN ) 50 mg, Oral, Daily at bedtime   nystatin  ointment (MYCOSTATIN ) 1 Application, Topical, 2 times daily   pantoprazole  (PROTONIX ) 40 MG tablet TAKE 1 TABLET EVERY DAY (NEED MD APPOINTMENT)   tiZANidine  (ZANAFLEX ) 4 mg, Oral, Daily at bedtime   valACYclovir  (VALTREX ) 1000 MG tablet TAKE 1 TABLET EVERY DAY (NEED MD APPOINTMENT)    Patient  Active Problem List   Diagnosis Date Noted   OAB (overactive bladder) 05/23/2023   Fecal smearing 05/23/2023   Vaginal atrophy 05/23/2023   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     Objective:     BP 110/60   Pulse 76   Temp (!) 97.5 F (36.4 C) (Oral)   Ht 5\' 6"  (1.676 m)   Wt 204 lb 11.2 oz (92.9 kg)   SpO2 98%   BMI 33.04 kg/m    Physical Exam Vitals reviewed.  Constitutional:      Appearance: Normal appearance. She is well-groomed and normal weight.  Cardiovascular:     Rate and Rhythm: Normal rate and regular rhythm.     Heart sounds: S1 normal and S2 normal.  Pulmonary:     Effort: Pulmonary effort is normal.     Breath sounds: Normal breath sounds and air entry.  Musculoskeletal:     Right lower leg: No edema.     Left lower leg: No edema.  Neurological:  Mental Status: She is alert and oriented to person, place, and time. Mental status is at baseline.     Gait: Gait is intact.  Psychiatric:        Mood and Affect: Mood and affect normal.        Speech: Speech normal.        Behavior: Behavior normal.      No results found for any visits on 07/04/23.    The 10-year ASCVD risk score (Arnett DK, et al., 2019) is: 17.2%    Assessment & Plan:  Osteoporosis without current pathological fracture, unspecified osteoporosis type Assessment & Plan: On fosamax  70 mg weekly, no side effects reported today, will refill this medication. She is due for repeat DEXA scan, will order  Orders: -     DG Bone Density; Future  Postmenopausal state -     DG Bone Density;  Future  Arthritis -     Uric acid; Future -     Rheumatoid factor; Future  Idiopathic peripheral neuropathy Assessment & Plan: With additional pain in her hands and decreased sensation in her feet. We discussed options for treatment/work up, will check uric acid and rheumatoid to rule out inflammatory arthritis and increase her gabapentin  to 800 mg BID. I will send her back to neurology for a repeat EMG study -- they will ned to test her BL upper extremities also.   Orders: -     Ambulatory referral to Neurology -     Gabapentin ; Take 2 capsules (800 mg total) by mouth 2 (two) times daily.  Dispense: 360 capsule; Refill: 1  Mixed hyperlipidemia Assessment & Plan: On atorvastatin  10 mg daily, will order a new Lipid panel today.  Orders: -     Lipid panel; Future     Return in about 6 months (around 01/04/2024) for ok to cancel June appt.    Aida House, MD

## 2023-07-04 NOTE — Assessment & Plan Note (Signed)
 With additional pain in her hands and decreased sensation in her feet. We discussed options for treatment/work up, will check uric acid and rheumatoid to rule out inflammatory arthritis and increase her gabapentin  to 800 mg BID. I will send her back to neurology for a repeat EMG study -- they will ned to test her BL upper extremities also.

## 2023-07-04 NOTE — Assessment & Plan Note (Signed)
On atorvastatin 10 mg daily, will order a new Lipid panel today.

## 2023-07-04 NOTE — Assessment & Plan Note (Signed)
 On fosamax  70 mg weekly, no side effects reported today, will refill this medication. She is due for repeat DEXA scan, will order

## 2023-07-05 LAB — RHEUMATOID FACTOR: Rheumatoid fact SerPl-aCnc: 10 [IU]/mL (ref <14)

## 2023-07-07 ENCOUNTER — Encounter: Payer: Self-pay | Admitting: Obstetrics and Gynecology

## 2023-07-07 ENCOUNTER — Ambulatory Visit: Admitting: Obstetrics and Gynecology

## 2023-07-07 VITALS — BP 108/73 | HR 77

## 2023-07-07 DIAGNOSIS — R151 Fecal smearing: Secondary | ICD-10-CM

## 2023-07-07 DIAGNOSIS — N3281 Overactive bladder: Secondary | ICD-10-CM | POA: Diagnosis not present

## 2023-07-07 DIAGNOSIS — N3941 Urge incontinence: Secondary | ICD-10-CM

## 2023-07-07 NOTE — Progress Notes (Signed)
 [facility]  PTNS VISIT  CC:  Overactive bladder  78 y.o. with refractory overactive bladder who presents for percutaneous tibial nerve stimulation. The patient presents for PTNS session # 9.   Procedure: The patient was placed in the sitting position and the left lower extremity was prepped in the usual fashion. The PTNS needle was then inserted at a 60 degree angle, 5 cm cephalad and 2 cm posterior to the medial malleolus. The PTNS unit was then programmed and an optimal response was noted at 10 milliamps. The PTNS stimulation was then performed at this setting for 30 minutes without incident and the patient tolerated the procedure well. The needle was removed and hemostasis was noted.   The pt will return in 1 week for PTNS session # 10. All questions were answered.

## 2023-07-08 ENCOUNTER — Ambulatory Visit (INDEPENDENT_AMBULATORY_CARE_PROVIDER_SITE_OTHER)
Admission: RE | Admit: 2023-07-08 | Discharge: 2023-07-08 | Disposition: A | Discharge status: Home / Self Care | Source: Ambulatory Visit | Attending: Family Medicine | Admitting: Family Medicine

## 2023-07-08 DIAGNOSIS — M810 Age-related osteoporosis without current pathological fracture: Secondary | ICD-10-CM

## 2023-07-08 DIAGNOSIS — Z780 Asymptomatic menopausal state: Secondary | ICD-10-CM

## 2023-07-10 ENCOUNTER — Encounter: Payer: Self-pay | Admitting: Family Medicine

## 2023-07-14 ENCOUNTER — Ambulatory Visit: Admitting: Obstetrics and Gynecology

## 2023-07-14 ENCOUNTER — Ambulatory Visit
Admission: EM | Admit: 2023-07-14 | Discharge: 2023-07-14 | Disposition: A | Discharge status: Home / Self Care | Attending: Family Medicine | Admitting: Family Medicine

## 2023-07-14 DIAGNOSIS — J040 Acute laryngitis: Secondary | ICD-10-CM

## 2023-07-14 DIAGNOSIS — J309 Allergic rhinitis, unspecified: Secondary | ICD-10-CM | POA: Diagnosis not present

## 2023-07-14 DIAGNOSIS — J069 Acute upper respiratory infection, unspecified: Secondary | ICD-10-CM

## 2023-07-14 LAB — POCT RAPID STREP A (OFFICE): Rapid Strep A Screen: NEGATIVE

## 2023-07-14 MED ORDER — DEXAMETHASONE SODIUM PHOSPHATE 10 MG/ML IJ SOLN
10.0000 mg | Freq: Once | INTRAMUSCULAR | Status: AC
  Administered 2023-07-14: 10 mg via INTRAMUSCULAR

## 2023-07-14 MED ORDER — PROMETHAZINE-DM 6.25-15 MG/5ML PO SYRP: 5.0000 mL | ORAL_SOLUTION | Freq: Every evening | ORAL | 0 refills | Status: DC | PRN

## 2023-07-14 NOTE — ED Triage Notes (Signed)
 Pt states sore throat since yesterday.  States she took a home covid test which was negative.

## 2023-07-14 NOTE — ED Provider Notes (Signed)
 [facility]  Note:  This document was prepared using Conservation officer, historic buildings and may include unintentional dictation errors.  MRN: [id number] DOB: 10/28/1945  Subjective:   Jillian Guerrero is a 78 y.o. female presenting for 1 day history of throat pain, throat congestion, significant postnasal drainage, hoarseness, coughing.  Patient reports that generally she has a chronic cough and is not changed much.  She takes Zyrtec for this and also acid reflux medicine.  No chest pain, shortness of breath or wheezing.  No fever, body pains, nausea, vomiting, abdominal pain, ear pain, rashes.  Patient does have chronic rhinitis as well.  No smoking.  She did COVID test at home and was negative.  No current facility-administered medications for this encounter.  Current Outpatient Medications:    alendronate  (FOSAMAX ) 70 MG tablet, TAKE 1 TABLET EVERY 7 DAYS - TAKE WITH A FULL GLASS OF WATER ON AN EMPTY STOMACH., Disp: 12 tablet, Rfl: 0   atorvastatin  (LIPITOR) 10 MG tablet, TAKE 1 TABLET EVERY DAY (NEED MD APPOINTMENT), Disp: 90 tablet, Rfl: 1   calcium  citrate-vitamin D  (CITRACAL+D) 315-200 MG-UNIT tablet, Take 1 tablet by mouth 2 (two) times daily., Disp: , Rfl:    cetirizine (ZYRTEC) 10 MG tablet, Take 10 mg by mouth daily., Disp: , Rfl:    gabapentin  (NEURONTIN ) 400 MG capsule, Take 2 capsules (800 mg total) by mouth 2 (two) times daily., Disp: 360 capsule, Rfl: 1   mirabegron  ER (MYRBETRIQ ) 50 MG TB24 tablet, Take 1 tablet (50 mg total) by mouth daily., Disp: 90 tablet, Rfl: 3   nitrofurantoin  (MACRODANTIN ) 50 MG capsule, Take 1 capsule (50 mg total) by mouth at bedtime., Disp: 90 capsule, Rfl: 1   nystatin  ointment (MYCOSTATIN ), Apply 1 Application topically 2 (two) times daily., Disp: 30 g, Rfl: 0   pantoprazole  (PROTONIX ) 40 MG tablet, TAKE 1 TABLET EVERY DAY (NEED MD APPOINTMENT), Disp: 90 tablet, Rfl: 0   tiZANidine  (ZANAFLEX ) 4 MG tablet, TAKE 1 TABLET AT  BEDTIME, Disp: 90 tablet, Rfl: 3   valACYclovir  (VALTREX ) 1000 MG tablet, TAKE 1 TABLET EVERY DAY (NEED MD APPOINTMENT), Disp: 90 tablet, Rfl: 1   Allergies  Allergen Reactions   Ciprofloxacin  Swelling   Collagen     Redness, swelling    Past Medical History:  Diagnosis Date   Asthma    GERD (gastroesophageal reflux disease)    Hyperlipidemia    Lumbar compression fracture (HCC)    Macular degeneration, wet (HCC)    both eyes   Osteopenia    Osteoporosis    Peripheral neuropathy      Past Surgical History:  Procedure Laterality Date   APPENDECTOMY  1956   BLADDER SUSPENSION  2003   BREAST BIOPSY  2015   hyaluronic acid viscosupplementation Bilateral    knees - 01/16/2018 x2   IR RADIOLOGIST EVAL & MGMT  01/21/2021   KYPHOPLASTY  10/2018   TONSILLECTOMY  1964   UPPER GI ENDOSCOPY  03/08/2019   gastritis    Family History  Problem Relation Age of Onset   Arthritis Mother    Heart disease Mother    Arthritis Father    Depression Father    Arthritis Brother    High blood pressure Brother    High Cholesterol Brother    Arthritis Maternal Grandfather    Asthma Paternal Grandmother    Depression Paternal Grandmother    Heart disease Paternal Grandmother    High Cholesterol Paternal Grandmother    Arthritis  Daughter    Arthritis Son    Heart attack Son    High Cholesterol Son    Colon cancer Neg Hx    Esophageal cancer Neg Hx    Rectal cancer Neg Hx    Bladder Cancer Neg Hx    Uterine cancer Neg Hx     Social History   Tobacco Use   Smoking status: Former    Current packs/day: 1.00    Average packs/day: 1 pack/day for 40.0 years (40.0 ttl pk-yrs)    Types: Cigarettes   Smokeless tobacco: Never   Tobacco comments:    Quit in 2002  Vaping Use   Vaping status: Never Used  Substance Use Topics   Alcohol use: Never   Drug use: Never    ROS   Objective:   Vitals: BP (!) 152/87 (BP Location: Left Arm)   Pulse 75   Temp 98 F (36.7 C) (Oral)    Resp 16   SpO2 97%   Physical Exam Constitutional:      General: She is not in acute distress.    Appearance: Normal appearance. She is well-developed. She is not ill-appearing, toxic-appearing or diaphoretic.  HENT:     Head: Normocephalic and atraumatic.     Nose: Nose normal.     Mouth/Throat:     Mouth: Mucous membranes are moist.     Pharynx: No pharyngeal swelling, oropharyngeal exudate, posterior oropharyngeal erythema or uvula swelling.     Tonsils: No tonsillar exudate or tonsillar abscesses. 0 on the right. 0 on the left.     Comments: Thick streaks of postnasal drainage overlying pharynx.  Eyes:     General: No scleral icterus.       Right eye: No discharge.        Left eye: No discharge.     Extraocular Movements: Extraocular movements intact.  Cardiovascular:     Rate and Rhythm: Normal rate and regular rhythm.     Heart sounds: Normal heart sounds. No murmur heard.    No friction rub. No gallop.  Pulmonary:     Effort: Pulmonary effort is normal. No respiratory distress.     Breath sounds: No stridor. No wheezing, rhonchi or rales.  Chest:     Chest wall: No tenderness.  Skin:    General: Skin is warm and dry.  Neurological:     General: No focal deficit present.     Mental Status: She is alert and oriented to person, place, and time.  Psychiatric:        Mood and Affect: Mood normal.        Behavior: Behavior normal.    Results for orders placed or performed during the hospital encounter of 07/14/23 (from the past 24 hours)  POCT rapid strep A     Status: None   Collection Time: 07/14/23  8:28 AM  Result Value Ref Range   Rapid Strep A Screen Negative Negative   A dose of 10 mg IM dexamethasone was administered in clinic.  Assessment and Plan :   PDMP not reviewed this encounter.  1. Laryngitis   2. Viral upper respiratory infection   3. Allergic rhinitis, unspecified seasonality, unspecified trigger    Suspect acute laryngitis secondary to viral  respiratory infection, allergic rhinitis.  Recommend supportive care.  Deferred imaging given clear cardiopulmonary exam, hemodynamically stable vital signs.  Counseled patient on potential for adverse effects with medications prescribed/recommended today, ER and return-to-clinic precautions discussed, patient verbalized understanding.  Adolph Hoop, [place] 07/14/23 [phone]

## 2023-07-21 ENCOUNTER — Encounter: Payer: Self-pay | Admitting: Obstetrics and Gynecology

## 2023-07-21 ENCOUNTER — Ambulatory Visit (INDEPENDENT_AMBULATORY_CARE_PROVIDER_SITE_OTHER): Admitting: Obstetrics and Gynecology

## 2023-07-21 VITALS — BP 102/62 | HR 90

## 2023-07-21 DIAGNOSIS — N3281 Overactive bladder: Secondary | ICD-10-CM | POA: Diagnosis not present

## 2023-07-21 DIAGNOSIS — R151 Fecal smearing: Secondary | ICD-10-CM

## 2023-07-21 DIAGNOSIS — N3941 Urge incontinence: Secondary | ICD-10-CM

## 2023-07-21 NOTE — Progress Notes (Signed)
 [facility]  PTNS VISIT  CC:  Overactive bladder  78 y.o. with refractory overactive bladder who presents for percutaneous tibial nerve stimulation. The patient presents for PTNS session # 10.   Procedure: The patient was placed in the sitting position and the left lower extremity was prepped in the usual fashion. The PTNS needle was then inserted at a 60 degree angle, 5 cm cephalad and 2 cm posterior to the medial malleolus. The PTNS unit was then programmed and an optimal response was noted at 15 milliamps. The PTNS stimulation was then performed at this setting for 30 minutes without incident and the patient tolerated the procedure well. The needle was removed and hemostasis was noted.   The pt will return in 1 week for PTNS session # 11. All questions were answered.

## 2023-07-28 ENCOUNTER — Ambulatory Visit (INDEPENDENT_AMBULATORY_CARE_PROVIDER_SITE_OTHER): Admitting: Obstetrics and Gynecology

## 2023-07-28 ENCOUNTER — Encounter: Payer: Self-pay | Admitting: Obstetrics and Gynecology

## 2023-07-28 VITALS — BP 116/72 | HR 74

## 2023-07-28 DIAGNOSIS — N3281 Overactive bladder: Secondary | ICD-10-CM

## 2023-07-28 DIAGNOSIS — N3941 Urge incontinence: Secondary | ICD-10-CM

## 2023-07-28 DIAGNOSIS — R151 Fecal smearing: Secondary | ICD-10-CM

## 2023-07-28 NOTE — Progress Notes (Signed)
 [facility]  PTNS VISIT  CC:  Overactive bladder  78 y.o. with refractory overactive bladder who presents for percutaneous tibial nerve stimulation. The patient presents for PTNS session # 11.   Procedure: The patient was placed in the sitting position and the left lower extremity was prepped in the usual fashion. The PTNS needle was then inserted at a 60 degree angle, 5 cm cephalad and 2 cm posterior to the medial malleolus. The PTNS unit was then programmed and an optimal response was noted at 12 milliamps. The PTNS stimulation was then performed at this setting for 30 minutes without incident and the patient tolerated the procedure well. The needle was removed and hemostasis was noted.   The pt will return in 1 week for PTNS session # 12. All questions were answered.

## 2023-08-04 ENCOUNTER — Ambulatory Visit (INDEPENDENT_AMBULATORY_CARE_PROVIDER_SITE_OTHER): Admitting: Obstetrics and Gynecology

## 2023-08-04 ENCOUNTER — Encounter: Payer: Self-pay | Admitting: Obstetrics and Gynecology

## 2023-08-04 VITALS — BP 108/66 | HR 79

## 2023-08-04 DIAGNOSIS — N3281 Overactive bladder: Secondary | ICD-10-CM

## 2023-08-04 DIAGNOSIS — R350 Frequency of micturition: Secondary | ICD-10-CM

## 2023-08-04 DIAGNOSIS — N3941 Urge incontinence: Secondary | ICD-10-CM

## 2023-08-04 NOTE — Patient Instructions (Signed)
 Constipation: Our goal is to achieve formed bowel movements daily or every-other-day.  You may need to try different combinations of the following options to find what works best for you - everybody's body works differently so feel free to adjust the dosages as needed.  Some options to help maintain bowel health include:  Dietary changes (more leafy greens, vegetables and fruits; less processed foods) "Power Pudding" is a natural mixture that may help your constipation.  To make blend 1 cup applesauce, 1 cup wheat bran, and 3/4 cup prune juice, refrigerate and then take 1 tablespoon daily with a large glass of water as needed.

## 2023-08-04 NOTE — Progress Notes (Signed)
 [facility]  PTNS VISIT  CC:  Overactive bladder  78 y.o. with refractory overactive bladder who presents for percutaneous tibial nerve stimulation. The patient presents for PTNS session # 12.   Procedure: The patient was placed in the sitting position and the left lower extremity was prepped in the usual fashion. The PTNS needle was then inserted at a 60 degree angle, 5 cm cephalad and 2 cm posterior to the medial malleolus. The PTNS unit was then programmed and an optimal response was noted at 16 milliamps. The PTNS stimulation was then performed at this setting for 30 minutes without incident and the patient tolerated the procedure well. The needle was removed and hemostasis was noted.   The pt will return in 8 weeks for PTNS maintenance session.  All questions were answered.

## 2023-08-08 ENCOUNTER — Other Ambulatory Visit: Payer: Self-pay | Admitting: Family Medicine

## 2023-08-08 DIAGNOSIS — E782 Mixed hyperlipidemia: Secondary | ICD-10-CM

## 2023-08-09 ENCOUNTER — Encounter: Payer: Self-pay | Admitting: Physical Therapy

## 2023-08-09 ENCOUNTER — Ambulatory Visit: Payer: Medicare Other | Admitting: Family Medicine

## 2023-08-09 ENCOUNTER — Encounter: Attending: Obstetrics | Admitting: Physical Therapy

## 2023-08-09 ENCOUNTER — Other Ambulatory Visit: Payer: Self-pay

## 2023-08-09 DIAGNOSIS — M6281 Muscle weakness (generalized): Secondary | ICD-10-CM | POA: Insufficient documentation

## 2023-08-09 DIAGNOSIS — R278 Other lack of coordination: Secondary | ICD-10-CM | POA: Diagnosis present

## 2023-08-09 NOTE — Therapy (Addendum)
 OUTPATIENT PHYSICAL THERAPY FEMALE PELVIC EVALUATION   Patient Name: Jillian Guerrero MRN: [id number] DOB:20-Sep-1945, 78 y.o., female Today's Date: 08/09/2023  END OF SESSION:  PT End of Session - 08/09/23 1614     Visit Number 1    Date for PT Re-Evaluation 11/01/23    Authorization Type Medicare    Authorization - Visit Number 1    Authorization - Number of Visits 10    PT Start Time 1600    PT Stop Time 1645    PT Time Calculation (min) 45 min    Activity Tolerance Patient tolerated treatment well    Behavior During Therapy WFL for tasks assessed/performed             Past Medical History:  Diagnosis Date   Asthma    GERD (gastroesophageal reflux disease)    Hyperlipidemia    Lumbar compression fracture (HCC)    Macular degeneration, wet (HCC)    both eyes   Osteopenia    Osteoporosis    Peripheral neuropathy    Past Surgical History:  Procedure Laterality Date   APPENDECTOMY  1956   BLADDER SUSPENSION  2003   BREAST BIOPSY  2015   hyaluronic acid viscosupplementation Bilateral    knees - 01/16/2018 x2   IR RADIOLOGIST EVAL & MGMT  01/21/2021   KYPHOPLASTY  10/2018   TONSILLECTOMY  1964   UPPER GI ENDOSCOPY  03/08/2019   gastritis   Patient Active Problem List   Diagnosis Date Noted   OAB (overactive bladder) 05/23/2023   Fecal smearing 05/23/2023   Vaginal atrophy 05/23/2023   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   Herpes zoster 11/10/2021   Peripheral neuropathy 11/10/2021   Chronic rhinitis 01/02/2021   Chronic cough 11/27/2020   Hyperlipidemia 01/20/2020   Asthma 01/20/2020   GERD (gastroesophageal reflux disease) 01/20/2020   Osteoporosis 01/20/2020   Vitamin D  deficiency 01/20/2020   Macular degeneration of both eyes 01/20/2020    PCP: Ozell Heron HERO, MD  REFERRING PROVIDER: Guadlupe Lianne DASEN, MD   REFERRING DIAG:  N32.81 (ICD-10-CM) - OAB (overactive bladder)  R15.1 (ICD-10-CM) - Fecal smearing    THERAPY DIAG:  Muscle weakness (generalized)  - Plan: PT plan of care cert/re-cert  Other lack of coordination - Plan: PT plan of care cert/re-cert  Rationale for Evaluation and Treatment: Rehabilitation  ONSET DATE: 2003  SUBJECTIVE:                                                                                                                                                                                           SUBJECTIVE STATEMENT: Patient had a bladder lift in 2003.  Bladder is still intact. Taking a break for 2 months from the TTNS. She does not have a bowel movement for 8 days but now can have a bowel movement daily while taking a mixture. No UTI's last year. Patient is going to a chiropractor  for back pain. No stool in underwear for 3 months except when she took the Doculax. She has recently have a smear of stool with gas for 2 days. She does not leak urine anymore. She will wear a pad when go out for several hours.  Fluid intake:   PAIN:  Are you having pain? No  PRECAUTIONS: Other: osteoporosis  RED FLAGS: None   WEIGHT BEARING RESTRICTIONS: No  FALLS:  Has patient fallen in last 6 months? No  OCCUPATION: retired  ACTIVITY LEVEL : low activity  PLOF: Independent  PATIENT GOALS: stronger pelvic floor  PERTINENT HISTORY:  Osteoporosis; Peripheral neuropathy; Bladder suspension 2003; Appendectomy 1956; Kyphoplasty;   BOWEL MOVEMENT: Pain with bowel movement: No Type of bowel movement:Frequency since taking the natural mixture for bowel movements she is having one daily but they are soft Fully empty rectum: Yes:   Leakage: Yes: when pass gas there may be fecal smearing Pads: Yes: just in case Fiber supplement/laxative Yes ,   URINATION: Pain with urination: No Fully empty bladder: Yes:   Stream: Strong Urgency: No Frequency: average Leakage: none since she has had the TTNS treatment  INTERCOURSE: not active   PREGNANCY: Vaginal deliveries 2 Tearing Yes:     OBJECTIVE:  Note: Objective  measures were completed at Evaluation unless otherwise noted.  DIAGNOSTIC FINDINGS:  none   COGNITION: Overall cognitive status: Within functional limits for tasks assessed     SENSATION: Light touch: Deficits decreased sensation of the right lower leg   POSTURE: No Significant postural limitations   LUMBARAROM/PROM: limited by 50%    LOWER EXTREMITY MMT: bilateral hip strength 4/5  PALPATION:   Abdominal: Patient will lift her rib cage to contract her pelvic floor, difficulty with contracting her lower abdomen                External Perineal Exam: Intact vulvar area                             Internal Pelvic Floor: tightness located on the vaginal introitus, along the levator ani and obturator internist; tightness along the anterior external sphincter  Patient confirms identification and approves PT to assess internal pelvic floor and treatment Yes  PELVIC MMT:   MMT eval  Vaginal At first 1/5 but after manual work increased to 2/5 with circular contraction  Internal Anal Sphincter 3/5 weak hug of therapist finger 1 second  External Anal Sphincter 3/5 weak hug of therapist finger 1 second  Puborectalis 3/5 weak hug of therapist finger 1 second  (Blank rows = not tested)         TODAY'S TREATMENT:  DATE: 08/09/23  EVAL Examination completed, findings reviewed, pt educated on POC, HEP, and female pelvic floor anatomy, reasoning with pelvic floor assessment internally with pt consent. Pt motivated to participate in PT and agreeable to attempt recommendations.     PATIENT EDUCATION: 08/09/23  Education details: Access Code: [email or web address] Person educated: Patient Education method: Explanation, Demonstration, Tactile cues, Verbal cues, and Handouts Education comprehension: verbalized understanding, returned demonstration, verbal cues required, tactile  cues required, and needs further education  HOME EXERCISE PROGRAM: 08/09/23 Access Code: [email or web address] URL: [email or web address] Date: 08/09/2023 Prepared by: Channing Pereyra  Program Notes next appt. 8/11 at 9:30 at [place] location  Exercises - Seated Pelvic Floor Contraction  - 3 x daily - 7 x weekly - 1 sets - 10 reps - 5 sec hold  ASSESSMENT:  CLINICAL IMPRESSION: Patient is a 78 y.o. female who was seen today for physical therapy evaluation and treatment for overactive bladder and fecal smearing. Patient has had 12 treatments for TTNS and is not having the overactive bladder issues at this time. Her vaginal strength is 2/5 with hug of therapist finger but not able to contract more than 5 seconds and needs cues to not lift her lower rib cage. Her rectal strength is 3/5 with not able to feel the contraction, weak hug of therapist finger and only able to hold for 1 second. She will have fecal smearing with gas. She is making a natural constipation paste that is helping her have bowel movements. She will wear a pad due to the fecal smearing. She has weakness in the hips and core. Patient will benefit from skilled therapy to improve pelvic floor strength to reduce fecal leakage and continue with reduction of UTI's.   OBJECTIVE IMPAIRMENTS: decreased coordination and decreased strength.   ACTIVITY LIMITATIONS: continence and toileting  PARTICIPATION LIMITATIONS: shopping and community activity  PERSONAL FACTORS: Osteoporosis; Peripheral neuropathy; Bladder suspension 2003; Appendectomy 1956; Kyphoplasty are also affecting patient's functional outcome.   REHAB POTENTIAL: Excellent  CLINICAL DECISION MAKING: Evolving/moderate complexity  EVALUATION COMPLEXITY: Moderate   GOALS: Goals reviewed with patient? Yes  SHORT TERM GOALS: Target date: 09/06/23  Patient is able to contract the pelvic floor and feel it to begin reduction of fecal smearing.  Baseline: Goal status:  INITIAL  2.  Patient is able to perform diaphragmatic breathing to assist with pushing out stool and urine.  Baseline:  Goal status: INITIAL   LONG TERM GOALS: Target date: 11/01/23  Patient is independent with advanced HEP for core and pelvic floor to reduce fecal smearing and reduction of UTI.  Baseline:  Goal status: INITIAL  2.  Pelvic floor strength is >/= 3/5 holding for 10 sec to improve the ability to pass gas and not have fecal smearing.  Baseline:  Goal status: INITIAL  3.  Patient is able to push urine out and stool out without putting pressure on her pelvic floor to reduce the chance of prolapse.  Baseline:  Goal status: INITIAL   PLAN:  PT FREQUENCY: 1-2x/week  PT DURATION: 12 weeks  PLANNED INTERVENTIONS: 97110-Therapeutic exercises, 97530- Therapeutic activity, 97112- Neuromuscular re-education, 97535- Self Care, [id number]- Manual therapy, Patient/Family education, Cryotherapy, Moist heat, and Biofeedback  PLAN FOR NEXT SESSION: work on pelvic floor to improve contraction, diaphragmatic breathing, abdominal engagement; abdominal massage   Channing Pereyra, PT 08/09/23 5:41 PM  PHYSICAL THERAPY DISCHARGE SUMMARY  Visits from Start of Care: 1  Current functional level related to goals / functional outcomes: See above. Patient cancelled due to being  uncomfortable with the therapy that would be provided.    Remaining deficits: See above   Education / Equipment: HEP   Patient agrees to discharge. Patient goals were not met. Patient is being discharged due to the patient's request. Thank you for the referral.  Channing Pereyra, PT 09/11/23 10:52 AM

## 2023-08-27 ENCOUNTER — Encounter (HOSPITAL_COMMUNITY): Payer: Self-pay | Admitting: Interventional Radiology

## 2023-08-28 ENCOUNTER — Other Ambulatory Visit: Payer: Self-pay | Admitting: Family Medicine

## 2023-08-28 DIAGNOSIS — B029 Zoster without complications: Secondary | ICD-10-CM

## 2023-08-29 ENCOUNTER — Ambulatory Visit (INDEPENDENT_AMBULATORY_CARE_PROVIDER_SITE_OTHER): Admitting: Neurology

## 2023-08-29 ENCOUNTER — Encounter: Payer: Self-pay | Admitting: Neurology

## 2023-08-29 VITALS — BP 134/75 | HR 89 | Ht 66.0 in | Wt 208.0 lb

## 2023-08-29 DIAGNOSIS — R202 Paresthesia of skin: Secondary | ICD-10-CM

## 2023-08-29 NOTE — Progress Notes (Signed)
 [facility] Clinic Note - Initial Visit   Date: 08/29/2023   Jillian Guerrero MRN: [id number] DOB: [birth year]   Dear Dr. Heron:  Thank you for your kind referral of Jillian Guerrero for consultation of bilateral hand and feet tingling. Although her history is well known to you, please allow us  to reiterate it for the purpose of our medical record. The patient was accompanied to the clinic by self.     Jillian Guerrero is a 78 y.o. right-handed [place] female with GERD, hyperlipidemia, asthma, and lumbar fracture s/p kyphoplasty presenting for evaluation of bilateral hand and feet numbness/tingling.   IMPRESSION/PLAN: Bilateral feet tingling/numbness, worse on the right.  Bilateral hand numbness swelling upon awakening.  Prior NCS/EMG in 2022 of the left arm and leg was normal.  Her neurological exam shows normal distal strength, reflexes, and sensation making neuropathy less likely.  To further evaluate her symptoms, she will return for NCS/EMG bilateral arms and legs.  She takes gabapentin  800mg  BID, which can only help with tingling sensation.  Medications do not improve numbness.   Further recommendations pending results.  ------------------------------------------------------------- History of present illness: She reports having right > left foot tingling for several month.  Symptoms are constant without exacerbating or alleviating factors.  She denies weakness, imbalance, or radicular leg pain. She takes gabapentin  800mg  BID.   She also reports waking up at night with her hands feeling swollen and numb.  Sometimes, she has difficulty moving her hands.   Out-side paper records, electronic medical record, and images have been reviewed where available and summarized as:  NCS/EMG of the left arm and leg 07/02/2020: This is a normal study of the left upper and lower extremities.  In particular, there is no evidence of a sensorimotor polyneuropathy,  cervical/lumbosacral radiculopathy, or carpal tunnel syndrome.  Lab Results  Component Value Date   HGBA1C 5.8 06/07/2023   Lab Results  Component Value Date   VITAMINB12 268 06/07/2023   Lab Results  Component Value Date   TSH 0.68 05/04/2022   No results found for: ESRSEDRATE, POCTSEDRATE  Past Medical History:  Diagnosis Date   Asthma    GERD (gastroesophageal reflux disease)    Hyperlipidemia    Lumbar compression fracture (HCC)    Macular degeneration, wet (HCC)    both eyes   Osteopenia    Osteoporosis    Peripheral neuropathy     Past Surgical History:  Procedure Laterality Date   APPENDECTOMY  1956   BLADDER SUSPENSION  2003   BREAST BIOPSY  2015   hyaluronic acid viscosupplementation Bilateral    knees - 01/16/2018 x2   IR RADIOLOGIST EVAL & MGMT  01/21/2021   KYPHOPLASTY  10/2018   TONSILLECTOMY  1964   UPPER GI ENDOSCOPY  03/08/2019   gastritis     Medications:  Outpatient Encounter Medications as of 08/29/2023  Medication Sig   alendronate  (FOSAMAX ) 70 MG tablet TAKE 1 TABLET EVERY 7 DAYS - TAKE WITH A FULL GLASS OF WATER ON AN EMPTY STOMACH.   atorvastatin  (LIPITOR) 10 MG tablet TAKE 1 TABLET EVERY DAY (NEED MD APPOINTMENT)   calcium  citrate-vitamin D  (CITRACAL+D) 315-200 MG-UNIT tablet Take 1 tablet by mouth 2 (two) times daily.   cetirizine (ZYRTEC) 10 MG tablet Take 10 mg by mouth daily.   gabapentin  (NEURONTIN ) 400 MG capsule Take 2 capsules (800 mg total) by mouth 2 (two) times daily. (Patient taking differently: Take 500 mg by mouth 2 (two) times daily. Two  capsules two times daily)   mirabegron  ER (MYRBETRIQ ) 50 MG TB24 tablet Take 1 tablet (50 mg total) by mouth daily.   nitrofurantoin  (MACRODANTIN ) 50 MG capsule Take 1 capsule (50 mg total) by mouth at bedtime.   nystatin  ointment (MYCOSTATIN ) Apply 1 Application topically 2 (two) times daily.   pantoprazole  (PROTONIX ) 40 MG tablet TAKE 1 TABLET EVERY DAY (NEED MD APPOINTMENT)    tiZANidine  (ZANAFLEX ) 4 MG tablet TAKE 1 TABLET AT BEDTIME (Patient taking differently: Take 4 mg by mouth at bedtime. Take half tablet (2 mg) at bedtime)   valACYclovir  (VALTREX ) 1000 MG tablet TAKE 1 TABLET EVERY DAY (NEED MD APPOINTMENT)   [DISCONTINUED] promethazine -dextromethorphan (PROMETHAZINE -DM) 6.25-15 MG/5ML syrup Take 5 mLs by mouth at bedtime as needed for cough.   No facility-administered encounter medications on file as of 08/29/2023.    Allergies:  Allergies  Allergen Reactions   Ciprofloxacin  Swelling   Collagen     Redness, swelling    Family History: Family History  Problem Relation Age of Onset   Arthritis Mother    Heart disease Mother    Arthritis Father    Depression Father    Arthritis Brother    High blood pressure Brother    High Cholesterol Brother    Arthritis Maternal Grandfather    Asthma Paternal Grandmother    Depression Paternal Grandmother    Heart disease Paternal Grandmother    High Cholesterol Paternal Grandmother    Arthritis Daughter    Arthritis Son    Heart attack Son    High Cholesterol Son    Colon cancer Neg Hx    Esophageal cancer Neg Hx    Rectal cancer Neg Hx    Bladder Cancer Neg Hx    Uterine cancer Neg Hx     Social History: Social History   Tobacco Use   Smoking status: Former    Current packs/day: 1.00    Average packs/day: 1 pack/day for 40.0 years (40.0 ttl pk-yrs)    Types: Cigarettes   Smokeless tobacco: Never   Tobacco comments:    Quit in 2002  Vaping Use   Vaping status: Never Used  Substance Use Topics   Alcohol use: Never   Drug use: Never   Social History   Social History Narrative   Right handed    Lives alone       Are you right handed or left handed? Right Handed    Are you currently employed ? No     What is your current occupation?   Do you live at home alone? Yes   Who lives with you?    What type of home do you live in: 1 story or 2 story? Lives in a three story home.         Vital Signs:  BP 134/75   Pulse 89   Ht 5' 6 (1.676 m)   Wt 208 lb (94.3 kg)   SpO2 97%   BMI 33.57 kg/m  Neurological Exam: MENTAL STATUS including orientation to time, place, person, recent and remote memory, attention span and concentration, language, and fund of knowledge is normal.  Speech is not dysarthric.    CRANIAL NERVES: II:  No visual field defects.     III-IV-VI: Pupils equal round and reactive to light.  Normal conjugate, extra-ocular eye movements in all directions of gaze.  No nystagmus.  No ptosis.   V:  Normal facial sensation.    VII:  Normal facial symmetry and movements.  VIII:  Normal hearing and vestibular function.   IX-X:  Normal palatal movement.   XI:  Normal shoulder shrug and head rotation.   XII:  Normal tongue strength and range of motion, no deviation or fasciculation.  MOTOR:  No atrophy, fasciculations or abnormal movements.  No pronator drift.   Upper Extremity:  Right  Left  Deltoid  5/5   5/5   Biceps  5/5   5/5   Triceps  5/5   5/5   Wrist extensors  5/5   5/5   Wrist flexors  5/5   5/5   Finger extensors  5/5   5/5   Finger flexors  5/5   5/5   Dorsal interossei  5/5   5/5   Abductor pollicis  5/5   5/5   Tone (Ashworth scale)  0  0   Lower Extremity:  Right  Left  Hip flexors  5/5   5/5   Knee flexors  5/5   5/5   Knee extensors  5/5   5/5   Dorsiflexors  5/5   5/5   Plantarflexors  5/5   5/5   Toe extensors  5/5   5/5   Toe flexors  5/5   5/5   Tone (Ashworth scale)  0  0   MSRs:                                           Right        Left brachioradialis 2+  2+  biceps 2+  2+  triceps 2+  2+  patellar 2+  2+  ankle jerk 2+  2+  Hoffman no  no  plantar response down  down   SENSORY:  Normal and symmetric perception of light touch, pinprick, vibration, and proprioception.  Romberg's sign absent.   COORDINATION/GAIT: Normal finger-to- nose-finger.  Intact rapid alternating movements bilaterally.  Gait narrow based  and stable. Tandem and stressed gait intact.     Thank you for allowing me to participate in patient's care.  If I can answer any additional questions, I would be pleased to do so.    Sincerely,    Suellen Durocher K. Tobie, DO

## 2023-08-29 NOTE — Patient Instructions (Signed)
 Nerve testing of bilateral arms and legs  ELECTROMYOGRAM AND NERVE CONDUCTION STUDIES (EMG/NCS) INSTRUCTIONS  How to Prepare The neurologist conducting the EMG will need to know if you have certain medical conditions. Tell the neurologist and other EMG lab personnel if you: Have a pacemaker or any other electrical medical device Take blood-thinning medications Have hemophilia, a blood-clotting disorder that causes prolonged bleeding Bathing Take a shower or bath shortly before your exam in order to remove oils from your skin. Don't apply lotions or creams before the exam.  What to Expect You'll likely be asked to change into a hospital gown for the procedure and lie down on an examination table. The following explanations can help you understand what will happen during the exam.  Electrodes. The neurologist or a technician places surface electrodes at various locations on your skin depending on where you're experiencing symptoms. Or the neurologist may insert needle electrodes at different sites depending on your symptoms.  Sensations. The electrodes will at times transmit a tiny electrical current that you may feel as a twinge or spasm. The needle electrode may cause discomfort or pain that usually ends shortly after the needle is removed. If you are concerned about discomfort or pain, you may want to talk to the neurologist about taking a short break during the exam.  Instructions. During the needle EMG, the neurologist will assess whether there is any spontaneous electrical activity when the muscle is at rest - activity that isn't present in healthy muscle tissue - and the degree of activity when you slightly contract the muscle.  He or she will give you instructions on resting and contracting a muscle at appropriate times. Depending on what muscles and nerves the neurologist is examining, he or she may ask you to change positions during the exam.  After your EMG You may experience some  temporary, minor bruising where the needle electrode was inserted into your muscle. This bruising should fade within several days. If it persists, contact your primary care doctor.

## 2023-08-30 ENCOUNTER — Ambulatory Visit (INDEPENDENT_AMBULATORY_CARE_PROVIDER_SITE_OTHER): Admitting: Obstetrics and Gynecology

## 2023-08-30 ENCOUNTER — Encounter: Payer: Self-pay | Admitting: Obstetrics and Gynecology

## 2023-08-30 VITALS — BP 115/77 | HR 81

## 2023-08-30 DIAGNOSIS — N3281 Overactive bladder: Secondary | ICD-10-CM

## 2023-08-30 DIAGNOSIS — R151 Fecal smearing: Secondary | ICD-10-CM

## 2023-08-30 DIAGNOSIS — N3941 Urge incontinence: Secondary | ICD-10-CM

## 2023-08-30 NOTE — Progress Notes (Signed)
 [facility]  PTNS VISIT  CC:  Overactive bladder  78 y.o. with refractory overactive bladder who presents for percutaneous tibial nerve stimulation. The patient presents for PTNS maintenance session # 1.   Procedure: The patient was placed in the sitting position and the left lower extremity was prepped in the usual fashion. The PTNS needle was then inserted at a 60 degree angle, 5 cm cephalad and 2 cm posterior to the medial malleolus. The PTNS unit was then programmed and an optimal response was noted at 18 milliamps. The PTNS stimulation was then performed at this setting for 30 minutes without incident and the patient tolerated the procedure well. The needle was removed and hemostasis was noted.   The pt will return in 1 week for PTNS maintenance session # 2. All questions were answered.

## 2023-09-06 ENCOUNTER — Ambulatory Visit (INDEPENDENT_AMBULATORY_CARE_PROVIDER_SITE_OTHER): Admitting: Obstetrics and Gynecology

## 2023-09-06 VITALS — BP 119/78 | HR 85

## 2023-09-06 DIAGNOSIS — R151 Fecal smearing: Secondary | ICD-10-CM

## 2023-09-06 DIAGNOSIS — N3281 Overactive bladder: Secondary | ICD-10-CM

## 2023-09-06 NOTE — Progress Notes (Signed)
 [facility]  PTNS VISIT  CC:  Overactive bladder  78 y.o. with refractory overactive bladder who presents for percutaneous tibial nerve stimulation. The patient presents for PTNS Maintenance session # 2.   Procedure: The patient was placed in the sitting position and the left lower extremity was prepped in the usual fashion. The PTNS needle was then inserted at a 60 degree angle, 5 cm cephalad and 2 cm posterior to the medial malleolus. The PTNS unit was then programmed and an optimal response was noted at 13 milliamps. The PTNS stimulation was then performed at this setting for 30 minutes without incident and the patient tolerated the procedure well. The needle was removed and hemostasis was noted.   The pt will return in 8 weeks or sooner if needed for maintenance session #3. All questions were answered.

## 2023-09-07 ENCOUNTER — Other Ambulatory Visit: Payer: Self-pay | Admitting: Family Medicine

## 2023-09-07 DIAGNOSIS — N390 Urinary tract infection, site not specified: Secondary | ICD-10-CM

## 2023-09-07 DIAGNOSIS — M810 Age-related osteoporosis without current pathological fracture: Secondary | ICD-10-CM

## 2023-09-07 DIAGNOSIS — K219 Gastro-esophageal reflux disease without esophagitis: Secondary | ICD-10-CM

## 2023-10-06 ENCOUNTER — Ambulatory Visit: Admitting: Obstetrics and Gynecology

## 2023-10-07 ENCOUNTER — Ambulatory Visit: Admitting: Neurology

## 2023-10-07 DIAGNOSIS — R202 Paresthesia of skin: Secondary | ICD-10-CM

## 2023-10-07 DIAGNOSIS — G5622 Lesion of ulnar nerve, left upper limb: Secondary | ICD-10-CM

## 2023-10-07 NOTE — Procedures (Signed)
 [facility]  [address] [place], [address]  [place], [place] [address] Tel: [phone] Fax: [phone] Test Date:  10/07/2023  Patient: Jillian Guerrero DOB: 1945-12-30 Physician: Tonita Blanch, DO  Sex: Female Height: 5' 6 Ref Phys: Tonita Blanch, DO  ID#: [id number]   Technician:    History: This is a 78 year old female referred for evaluation of bilateral upper extremity paresthesias.  NCV & EMG Findings: Extensive electrodiagnostic testing of the right upper extremity and additional studies of the left shows:  Left ulnar sensory response is asymmetrically reduced as compared to the right. Bilateral median and mixed palmar responses are within normal limits. Left ulnar motor response shows slowed conduction velocity across the elbow (A Elbow-B Elbow, 45 m/s).  Right ulnar and bilateral median motor responses are within normal limits.  There is no evidence of active or chronic motor axonal loss changes affecting any of the tested muscles.  Motor unit configuration and recruitment pattern is within normal limits.    Impression: Left ulnar neuropathy with slowing across the elbow, mild. There is no evidence of carpal tunnel or cervical radiculopathy affecting the upper extremities.   ___________________________ Tonita Blanch, DO    Nerve Conduction Studies   Stim Site NR Peak (ms) Norm Peak (ms) O-P Amp (V) Norm O-P Amp  Left Median Anti Sensory (2nd Digit)  32 C  Wrist    3.2 <3.8 30.1 >10  Right Median Anti Sensory (2nd Digit)  32 C  Wrist    3.1 <3.8 29.4 >10  Left Ulnar Anti Sensory (5th Digit)  32 C  Wrist    2.9 <3.2 10.9 >5  Right Ulnar Anti Sensory (5th Digit)  32 C  Wrist    2.7 <3.2 22.7 >5     Stim Site NR Onset (ms) Norm Onset (ms) O-P Amp (mV) Norm O-P Amp Site1 Site2 Delta-0 (ms) Dist (cm) Vel (m/s) Norm Vel (m/s)  Left Median Motor (Abd Poll Brev)  32 C  Wrist    3.2 <4.0 9.7 >5 Elbow Wrist 5.8 31.0 53 >50  Elbow    9.0  9.4         Right  Median Motor (Abd Poll Brev)  32 C  Wrist    2.9 <4.0 11.1 >5 Elbow Wrist 5.8 32.0 55 >50  Elbow    8.7  11.0         Left Ulnar Motor (Abd Dig Minimi)  32 C  Wrist    2.3 <3.1 7.6 >7 B Elbow Wrist 3.9 22.0 56 >50  B Elbow    6.2  7.0  A Elbow B Elbow 2.2 10.0 *45 >50  A Elbow    8.4  6.9         Right Ulnar Motor (Abd Dig Minimi)  32 C  Wrist    2.3 <3.1 8.9 >7 B Elbow Wrist 4.0 22.0 55 >50  B Elbow    6.3  8.5  A Elbow B Elbow 1.6 10.0 62 >50  A Elbow    7.9  8.4            Stim Site NR Peak (ms) Norm Peak (ms) P-T Amp (V) Site1 Site2 Delta-P (ms) Norm Delta (ms)  Left Median/Ulnar Palm Comparison (Wrist - 8cm)  32 C  Median Palm    1.8 <2.2 77.5 Median Palm Ulnar Palm 0.2   Ulnar Palm    1.6 <2.2 16.1      Right Median/Ulnar Palm Comparison (Wrist - 8cm)  32 C  Median Palm    1.5 <2.2 62.3 Median Palm Ulnar Palm 0.3   Ulnar Palm    1.8 <2.2 14.3       Electromyography   Side Muscle Ins.Act Fibs Fasc Recrt Amp Dur Poly Activation Comment  Right 1stDorInt Nml Nml Nml Nml Nml Nml Nml Nml N/A  Right PronatorTeres Nml Nml Nml Nml Nml Nml Nml Nml N/A  Right Biceps Nml Nml Nml Nml Nml Nml Nml Nml N/A  Right Triceps Nml Nml Nml Nml Nml Nml Nml Nml N/A  Right Deltoid Nml Nml Nml Nml Nml Nml Nml Nml N/A  Left 1stDorInt Nml Nml Nml Nml Nml Nml Nml Nml N/A  Left PronatorTeres Nml Nml Nml Nml Nml Nml Nml Nml N/A  Left Biceps Nml Nml Nml Nml Nml Nml Nml Nml N/A  Left Triceps Nml Nml Nml Nml Nml Nml Nml Nml N/A  Left Deltoid Nml Nml Nml Nml Nml Nml Nml Nml N/A  Left Abd Dig Min Nml Nml Nml Nml Nml Nml Nml Nml N/A  Left FlexCarpiUln Nml Nml Nml Nml Nml Nml Nml Nml N/A      Waveforms:

## 2023-10-10 ENCOUNTER — Encounter: Payer: Self-pay | Admitting: Physical Therapy

## 2023-10-13 ENCOUNTER — Ambulatory Visit: Payer: Self-pay | Admitting: Neurology

## 2023-10-13 ENCOUNTER — Ambulatory Visit (INDEPENDENT_AMBULATORY_CARE_PROVIDER_SITE_OTHER): Admitting: Neurology

## 2023-10-13 DIAGNOSIS — R202 Paresthesia of skin: Secondary | ICD-10-CM

## 2023-10-13 NOTE — Procedures (Signed)
 [facility]  [address] [place], [address]  [place], [place] [address] Tel: [phone] Fax: [phone] Test Date:  10/13/2023  Patient: Jillian Guerrero DOB: [birth year] Physician: Tonita Blanch, DO  Sex: Female Height: 5' 6 Ref Phys: Tonita Blanch, DO  ID#: [id number]   Technician:    History: This is a 78 year old female referred for evaluation bilateral feet numbness.  NCV & EMG Findings: Electrodiagnostic testing of the right lower extremity and additional studies of the left shows: Bilateral sural and superficial peroneal sensory responses are within normal limits. Bilateral peroneal and tibial motor responses are within normal limits. Bilateral tibial H reflex studies are within normal limits. There is no evidence of active or chronic motor axonal loss changes affecting any of the tested muscles.  Motor unit configuration and recruitment pattern is within normal limits.    Impression: This is a normal study of the lower extremities.  In particular, there is no evidence of a large fiber sensorimotor polyneuropathy or lumbosacral radiculopathy affecting the lower extremities.   ___________________________ Tonita Blanch, DO    Nerve Conduction Studies   Stim Site NR Peak (ms) Norm Peak (ms) O-P Amp (V) Norm O-P Amp  Left Sup Peroneal Anti Sensory (Ant Lat Mall)  32 C  12 cm    2.1 <4.6 12.9 >3  Right Sup Peroneal Anti Sensory (Ant Lat Mall)  32 C  12 cm    2.2 <4.6 13.7 >3  Left Sural Anti Sensory (Lat Mall)  32 C  Calf    2.7 <4.6 10.4 >3  Right Sural Anti Sensory (Lat Mall)  32 C  Calf    3.7 <4.6 14.0 >3     Stim Site NR Onset (ms) Norm Onset (ms) O-P Amp (mV) Norm O-P Amp Site1 Site2 Delta-0 (ms) Dist (cm) Vel (m/s) Norm Vel (m/s)  Left Peroneal Motor (Ext Dig Brev)  32 C  Ankle    3.7 <6.0 4.2 >2.5 B Fib Ankle 8.4 40.0 48 >40  B Fib    12.1  3.4  Poplt B Fib 1.5 8.0 53 >40  Poplt    13.6  3.3         Right Peroneal Motor (Ext Dig Brev)  32 C  Ankle     2.7 <6.0 4.2 >2.5 B Fib Ankle 8.2 40.0 49 >40  B Fib    10.9  3.4  Poplt B Fib 1.8 8.0 44 >40  Poplt    12.7  3.4         Left Tibial Motor (Abd Hall Brev)  32 C  Ankle    3.8 <6.0 9.2 >4 Knee Ankle 9.0 43.0 48 >40  Knee    12.8  5.7         Right Tibial Motor (Abd Hall Brev)  32 C  Ankle    3.2 <6.0 9.3 >4 Knee Ankle 9.2 44.0 48 >40  Knee    12.4  6.8          Electromyography   Side Muscle Ins.Act Fibs Fasc Recrt Amp Dur Poly Activation Comment  Right AntTibialis Nml Nml Nml Nml Nml Nml Nml Nml N/A  Right Gastroc Nml Nml Nml Nml Nml Nml Nml Nml N/A  Right Flex Dig Long Nml Nml Nml Nml Nml Nml Nml Nml N/A  Right RectFemoris Nml Nml Nml Nml Nml Nml Nml Nml N/A  Right BicepsFemS Nml Nml Nml Nml Nml Nml Nml Nml N/A  Right GluteusMed Nml Nml Nml Nml Nml Nml Nml  Nml N/A  Left AntTibialis Nml Nml Nml Nml Nml Nml Nml Nml N/A  Left Gastroc Nml Nml Nml Nml Nml Nml Nml Nml N/A  Left Flex Dig Long Nml Nml Nml Nml Nml Nml Nml Nml N/A  Left BicepsFemS Nml Nml Nml Nml Nml Nml Nml Nml N/A  Left RectFemoris Nml Nml Nml Nml Nml Nml Nml Nml N/A  Left GluteusMed Nml Nml Nml Nml Nml Nml Nml Nml N/A      Waveforms:

## 2023-10-24 ENCOUNTER — Ambulatory Visit (INDEPENDENT_AMBULATORY_CARE_PROVIDER_SITE_OTHER): Payer: Medicare Other

## 2023-10-24 VITALS — Ht 66.0 in | Wt 202.0 lb

## 2023-10-24 DIAGNOSIS — Z0000 Encounter for general adult medical examination without abnormal findings: Secondary | ICD-10-CM | POA: Diagnosis not present

## 2023-10-24 NOTE — Progress Notes (Signed)
 Subjective:   Jillian Guerrero is a 78 y.o. who presents for a Medicare Wellness preventive visit.  As a reminder, Annual Wellness Visits don't include a physical exam, and some assessments may be limited, especially if this visit is performed virtually. We may recommend an in-person follow-up visit with your provider if needed.  Visit Complete: Virtual I connected with  Jillian Guerrero on 10/24/23 by a audio enabled telemedicine application and verified that I am speaking with the correct person using two identifiers.  Patient Location: Home  Provider Location: Home Office  I discussed the limitations of evaluation and management by telemedicine. The patient expressed understanding and agreed to proceed.  Vital Signs: Because this visit was a virtual/telehealth visit, some criteria may be missing or patient reported. Any vitals not documented were not able to be obtained and vitals that have been documented are patient reported.    Persons Participating in Visit: Patient.  AWV Questionnaire: Yes: Patient Medicare AWV questionnaire was completed by the patient on 10/21/23; I have confirmed that all information answered by patient is correct and no changes since this date.  Cardiac Risk Factors include: advanced age (>73men, >79 women)     Objective:    Today's Vitals   10/24/23 1506  Weight: 202 lb (91.6 kg)  Height: 5' 6 (1.676 m)   Body mass index is 32.6 kg/m.     10/24/2023    3:13 PM 08/29/2023   11:07 AM 08/09/2023    4:13 PM 10/20/2022    3:15 PM 10/16/2021   12:52 PM 10/15/2020   10:54 AM 05/08/2020    7:57 AM  Advanced Directives  Does Patient Have a Medical Advance Directive? Yes Yes Yes Yes Yes Yes Yes  Type of Estate agent of [place];Living will Healthcare Power of [place];Living will Healthcare Power of [place];Living will Healthcare Power of [place];Living will Healthcare Power of [place];Living will Healthcare Power of [place];Living  will Healthcare Power of [place];Living will;Out of facility DNR (pink MOST or yellow form)  Does patient want to make changes to medical advance directive?   No - Patient declined  No - Patient declined    Copy of Healthcare Power of Attorney in Chart? No - copy requested  No - copy requested No - copy requested No - copy requested No - copy requested     Current Medications (verified) Outpatient Encounter Medications as of 10/24/2023  Medication Sig   alendronate  (FOSAMAX ) 70 MG tablet TAKE 1 TABLET EVERY 7 DAYS - TAKE WITH A FULL GLASS OF WATER ON AN EMPTY STOMACH.   atorvastatin  (LIPITOR) 10 MG tablet TAKE 1 TABLET EVERY DAY (NEED MD APPOINTMENT)   calcium  citrate-vitamin D  (CITRACAL+D) 315-200 MG-UNIT tablet Take 1 tablet by mouth 2 (two) times daily.   cetirizine (ZYRTEC) 10 MG tablet Take 10 mg by mouth daily.   gabapentin  (NEURONTIN ) 400 MG capsule Take 2 capsules (800 mg total) by mouth 2 (two) times daily. (Patient taking differently: Take 500 mg by mouth 2 (two) times daily. Two capsules two times daily)   mirabegron  ER (MYRBETRIQ ) 50 MG TB24 tablet Take 1 tablet (50 mg total) by mouth daily.   nitrofurantoin  (MACRODANTIN ) 50 MG capsule TAKE 1 CAPSULE AT BEDTIME   nystatin  ointment (MYCOSTATIN ) Apply 1 Application topically 2 (two) times daily.   pantoprazole  (PROTONIX ) 40 MG tablet TAKE 1 TABLET EVERY DAY (NEED MD APPOINTMENT)   tiZANidine  (ZANAFLEX ) 4 MG tablet TAKE 1 TABLET AT BEDTIME (Patient taking differently: Take 4 mg  by mouth at bedtime. Take half tablet (2 mg) at bedtime)   valACYclovir  (VALTREX ) 1000 MG tablet TAKE 1 TABLET EVERY DAY (NEED MD APPOINTMENT)   No facility-administered encounter medications on file as of 10/24/2023.    Allergies (verified) Ciprofloxacin  and Collagen   History: Past Medical History:  Diagnosis Date   Asthma    GERD (gastroesophageal reflux disease)    Hyperlipidemia    Lumbar compression fracture (HCC)    Macular degeneration, wet  (HCC)    both eyes   Osteopenia    Osteoporosis    Peripheral neuropathy    Past Surgical History:  Procedure Laterality Date   APPENDECTOMY  1956   BLADDER SUSPENSION  2003   BREAST BIOPSY  2015   hyaluronic acid viscosupplementation Bilateral    knees - 01/16/2018 x2   IR RADIOLOGIST EVAL & MGMT  01/21/2021   KYPHOPLASTY  10/2018   TONSILLECTOMY  1964   UPPER GI ENDOSCOPY  03/08/2019   gastritis   Family History  Problem Relation Age of Onset   Arthritis Mother    Heart disease Mother    Arthritis Father    Depression Father    Arthritis Brother    High blood pressure Brother    High Cholesterol Brother    Arthritis Maternal Grandfather    Asthma Paternal Grandmother    Depression Paternal Grandmother    Heart disease Paternal Grandmother    High Cholesterol Paternal Grandmother    Arthritis Daughter    Arthritis Son    Heart attack Son    High Cholesterol Son    Colon cancer Neg Hx    Esophageal cancer Neg Hx    Rectal cancer Neg Hx    Bladder Cancer Neg Hx    Uterine cancer Neg Hx    Social History   Socioeconomic History   Marital status: Widowed    Spouse name: Not on file   Number of children: 2   Years of education: Not on file   Highest education level: 12th grade  Occupational History   Occupation: retired  Tobacco Use   Smoking status: Former    Current packs/day: 1.00    Average packs/day: 1 pack/day for 40.0 years (40.0 ttl pk-yrs)    Types: Cigarettes   Smokeless tobacco: Never   Tobacco comments:    Quit in 2002  Vaping Use   Vaping status: Never Used  Substance and Sexual Activity   Alcohol use: Never   Drug use: Never   Sexual activity: Not Currently    Birth control/protection: Coitus interruptus  Other Topics Concern   Not on file  Social History Narrative   Right handed    Lives alone       Are you right handed or left handed? Right Handed    Are you currently employed ? No     What is your current occupation?   Do you  live at home alone? Yes   Who lives with you?    What type of home do you live in: 1 story or 2 story? Lives in a three story home.       Social Drivers of Corporate investment banker Strain: Low Risk  (10/24/2023)   Overall Financial Resource Strain (CARDIA)    Difficulty of Paying Living Expenses: Not hard at all  Food Insecurity: No Food Insecurity (10/24/2023)   Hunger Vital Sign    Worried About Running Out of Food in the Last Year: Never true  Ran Out of Food in the Last Year: Never true  Transportation Needs: No Transportation Needs (10/24/2023)   PRAPARE - Administrator, Civil Service (Medical): No    Lack of Transportation (Non-Medical): No  Physical Activity: Sufficiently Active (10/24/2023)   Exercise Vital Sign    Days of Exercise per Week: 5 days    Minutes of Exercise per Session: 40 min  Stress: No Stress Concern Present (10/24/2023)   [employer] of Occupational Health - Occupational Stress Questionnaire    Feeling of Stress: Not at all  Social Connections: Moderately Isolated (10/24/2023)   Social Connection and Isolation Panel    Frequency of Communication with Friends and Family: More than three times a week    Frequency of Social Gatherings with Friends and Family: More than three times a week    Attends Religious Services: More than 4 times per year    Active Member of [employer] or Organizations: No    Attends Banker Meetings: Not on file    Marital Status: Widowed    Tobacco Counseling Counseling given: Not Answered Tobacco comments: Quit in 2002    Clinical Intake:  Pre-visit preparation completed: Yes  Pain : No/denies pain     BMI - recorded: 32.6 Nutritional Status: BMI > 30  Obese Nutritional Risks: None Diabetes: No  Lab Results  Component Value Date   HGBA1C 5.8 06/07/2023     How often do you need to have someone help you when you read instructions, pamphlets, or other written materials from your doctor  or pharmacy?: 1 - Never  Interpreter Needed?: No  Information entered by :: Rojelio Blush LPN   Activities of Daily Living     10/24/2023    3:12 PM 10/21/2023    8:20 AM  In your present state of health, do you have any difficulty performing the following activities:  Hearing? 0 0  Vision? 0 0  Difficulty concentrating or making decisions? 0 0  Walking or climbing stairs? 0 0  Dressing or bathing? 0 0  Doing errands, shopping? 0 0  Preparing Food and eating ? N N  Using the Toilet? N N  In the past six months, have you accidently leaked urine? N N  Do you have problems with loss of bowel control? N N  Managing your Medications? N N  Managing your Finances? N N  Housekeeping or managing your Housekeeping? N N    Patient Care Team: Ozell Heron HERO, MD as PCP - General (Family Medicine) Patel, Donika K, DO as Consulting Physician (Neurology)  I have updated your Care Teams any recent Medical Services you may have received from other providers in the past year.     Assessment:   This is a routine wellness examination for Ethelmae.  Hearing/Vision screen Hearing Screening - Comments:: Denies hearing difficulties   Vision Screening - Comments:: Wears rx glasses - up to date with routine eye exams with  Dr Aram Hinders   Goals Addressed               This Visit's Progress     Continue physical activity (pt-stated)        Remain Active       Depression Screen     10/24/2023    3:15 PM 10/20/2022    3:11 PM 11/10/2021    1:50 PM 10/16/2021   12:46 PM 10/15/2020   10:56 AM 10/15/2020   10:52 AM 09/26/2020    2:41 PM  PHQ 2/9 Scores  PHQ - 2 Score 0 0 0 0 0 0 0  PHQ- 9 Score   0    0    Fall Risk     10/24/2023    3:12 PM 10/21/2023    8:20 AM 08/29/2023   11:07 AM 02/09/2023   11:20 AM 10/20/2022    3:13 PM  Fall Risk   Falls in the past year? 0 0 0 0 0  Number falls in past yr: 0 0 0  0  Injury with Fall? 0 0 0  0  Risk for fall due to : No Fall Risks     No Fall Risks  Follow up Falls evaluation completed  Falls evaluation completed  Falls prevention discussed    MEDICARE RISK AT HOME:  Medicare Risk at Home Any stairs in or around the home?: Yes If so, are there any without handrails?: No Home free of loose throw rugs in walkways, pet beds, electrical cords, etc?: Yes Adequate lighting in your home to reduce risk of falls?: Yes Life alert?: No Use of a cane, walker or w/c?: No Grab bars in the bathroom?: Yes Shower chair or bench in shower?: Yes Elevated toilet seat or a handicapped toilet?: Yes  TIMED UP AND GO:  Was the test performed?  No  Cognitive Function: 6CIT completed        10/24/2023    3:13 PM 10/20/2022    3:15 PM 10/16/2021   12:52 PM  6CIT Screen  What Year? 0 points 0 points 0 points  What month? 0 points 0 points 0 points  What time? 0 points 0 points 0 points  Count back from 20 0 points 0 points 0 points  Months in reverse 0 points 0 points 0 points  Repeat phrase 0 points 0 points 0 points  Total Score 0 points 0 points 0 points    Immunizations Immunization History  Administered Date(s) Administered   Fluad Quad(high Dose 65+) 11/10/2021   Hepatitis A 01/02/2014   Hepatitis A, Adult 01/02/2014, 02/11/2014   Influenza-Unspecified 02/22/2020, 01/09/2023   Moderna Sars-Covid-2 Vaccination 06/12/2019, 07/11/2019, 04/08/2020, 01/19/2021   Pneumococcal Conjugate-13 12/18/2013   Pneumococcal Polysaccharide-23 09/12/2012   Td 11/08/2013   Tdap 01/02/2014   Typhoid Inactivated 01/02/2014   Typhoid Live 01/02/2014   Yellow Fever 01/02/2014   Zoster Recombinant(Shingrix) 12/12/2020, 04/01/2021    Screening Tests Health Maintenance  Topic Date Due   Lung Cancer Screening  Never done   COVID-19 Vaccine (5 - 2024-25 season) 10/31/2022   INFLUENZA VACCINE  09/30/2023   DTaP/Tdap/Td (3 - Td or Tdap) 01/03/2024   Medicare Annual Wellness (AWV)  10/23/2024   Pneumococcal Vaccine: 50+ Years  Completed    DEXA SCAN  Completed   Hepatitis C Screening  Completed   Zoster Vaccines- Shingrix  Completed   HPV VACCINES  Aged Out   Meningococcal B Vaccine  Aged Out   Colonoscopy  Discontinued    Health Maintenance  Health Maintenance Due  Topic Date Due   Lung Cancer Screening  Never done   COVID-19 Vaccine (5 - 2024-25 season) 10/31/2022   INFLUENZA VACCINE  09/30/2023   Health Maintenance Items Addressed:   Additional Screening:  Vision Screening: Recommended annual ophthalmology exams for early detection of glaucoma and other disorders of the eye. Would you like a referral to an eye doctor? No    Dental Screening: Recommended annual dental exams for proper oral hygiene  Community Resource Referral / Chronic Care  Management: CRR required this visit?  No   CCM required this visit?  No   Plan:    I have personally reviewed and noted the following in the patient's chart:   Medical and social history Use of alcohol, tobacco or illicit drugs  Current medications and supplements including opioid prescriptions. Patient is not currently taking opioid prescriptions. Functional ability and status Nutritional status Physical activity Advanced directives List of other physicians Hospitalizations, surgeries, and ER visits in previous 12 months Vitals Screenings to include cognitive, depression, and falls Referrals and appointments  In addition, I have reviewed and discussed with patient certain preventive protocols, quality metrics, and best practice recommendations. A written personalized care plan for preventive services as well as general preventive health recommendations were provided to patient.   Rojelio LELON Blush, LPN   1/[id number]/[id number]   After Visit Summary: (MyChart) Due to this being a telephonic visit, the after visit summary with patients personalized plan was offered to patient via MyChart   Notes: Nothing significant to report at this time.

## 2023-10-24 NOTE — Patient Instructions (Signed)
 Ms. Dross , Thank you for taking time out of your busy schedule to complete your Annual Wellness Visit with me. I enjoyed our conversation and look forward to speaking with you again next year. I, as well as your care team,  appreciate your ongoing commitment to your health goals. Please review the following plan we discussed and let me know if I can assist you in the future. Your Game plan/ To Do List    Referrals: If you haven't heard from the office you've been referred to, please reach out to them at the phone provided.   Follow up Visits: We will see or speak with you next year for your Next Medicare AWV with our clinical staff 10/29/24 @ 3p  Have you seen your provider in the last 6 months (3 months if uncontrolled diabetes)?  Next appointment with provider 01/04/24 @ 11A  Clinician Recommendations:  Aim for 30 minutes of exercise or brisk walking, 6-8 glasses of water, and 5 servings of fruits and vegetables each day.       This is a list of the screenings recommended for you:  Health Maintenance  Topic Date Due   Screening for Lung Cancer  Never done   COVID-19 Vaccine (5 - 2024-25 season) 10/31/2022   Flu Shot  09/30/2023   DTaP/Tdap/Td vaccine (3 - Td or Tdap) 01/03/2024   Medicare Annual Wellness Visit  10/23/2024   Pneumococcal Vaccine for age over 30  Completed   DEXA scan (bone density measurement)  Completed   Hepatitis C Screening  Completed   Zoster (Shingles) Vaccine  Completed   HPV Vaccine  Aged Out   Meningitis B Vaccine  Aged Out   Colon Cancer Screening  Discontinued    Advanced directives: (Copy Requested) Please bring a copy of your health care power of attorney and living will to the office to be added to your chart at your convenience. You can mail to [facility] [address] [place], [place] [address] or email to [email or web address] Advance Care Planning is important because it:  [x]  Makes sure you receive the medical care that is  consistent with your values, goals, and preferences  [x]  It provides guidance to your family and loved ones and reduces their decisional burden about whether or not they are making the right decisions based on your wishes.  Follow the link provided in your after visit summary or read over the paperwork we have mailed to you to help you started getting your Advance Directives in place. If you need assistance in completing these, please reach out to us  so that we can help you!  See attachments for Preventive Care and Fall Prevention Tips.

## 2023-10-25 ENCOUNTER — Ambulatory Visit (INDEPENDENT_AMBULATORY_CARE_PROVIDER_SITE_OTHER): Admitting: Neurology

## 2023-10-25 ENCOUNTER — Encounter: Payer: Self-pay | Admitting: Neurology

## 2023-10-25 VITALS — BP 108/73 | HR 87 | Ht 66.0 in | Wt 209.0 lb

## 2023-10-25 DIAGNOSIS — R202 Paresthesia of skin: Secondary | ICD-10-CM

## 2023-10-25 DIAGNOSIS — G5622 Lesion of ulnar nerve, left upper limb: Secondary | ICD-10-CM

## 2023-10-25 NOTE — Progress Notes (Signed)
 Follow-up Visit   Date: 10/25/2023    Jillian Guerrero MRN: [id number] DOB: [birth year]    Jillian Guerrero is a 78 y.o. right-handed [place] female with GERD, hyperlipidemia, asthma, and lumbar fracture s/p kyphoplasty returning to the clinic for follow-up of bilateral hand and feet numbness/tingling.  The patient was accompanied to the clinic by self.   IMPRESSION/PLAN: Left ulnar neuropathy at the elbow, mild. She is asymptomatic.   Bilateral feet numbness and diagnosed with neuropathy by another provider.  Neither her exam of NCS from 2022 and 2025 show large fiber neuropathy.  She denies any pain and reports more of intermittent numbness and would like to discontinue gabapentin , which is reasonable given absence of pain.  She may reduce gabapentin  by 400mg  every 2 weeks and eventually stop, as long as she does well on a tapering dose.   Return to clinic as needed   --------------------------------------------- History of present illness: She reports having right > left foot tingling for several months.  Symptoms are constant without exacerbating or alleviating factors.  She denies weakness, imbalance, or radicular leg pain. She takes gabapentin  800mg  BID.   She also reports waking up at night with her hands feeling swollen and numb.  Sometimes, she has difficulty moving her hands.   UPDATE 10/25/2023:  She is here to discuss results of EMG which shows left ulnar neuropathy at the elbow and no evidence of neuropathy in the legs. She denies significant numbness/tingling of the hands.  She continues to have episodic numbness of the feet.  No burning, stabbing, or stinging pain in the feet.  She would like to stop gabapentin  and reports being started on this many years ago when she was told she has neuropathy.  However, with her recent testing showing that she does not have neuropathy, she would like to stop it.   Medications:  Current Outpatient Medications on File Prior to Visit   Medication Sig Dispense Refill   alendronate  (FOSAMAX ) 70 MG tablet TAKE 1 TABLET EVERY 7 DAYS - TAKE WITH A FULL GLASS OF WATER ON AN EMPTY STOMACH. 12 tablet 3   atorvastatin  (LIPITOR) 10 MG tablet TAKE 1 TABLET EVERY DAY (NEED MD APPOINTMENT) 90 tablet 1   calcium  citrate-vitamin D  (CITRACAL+D) 315-200 MG-UNIT tablet Take 1 tablet by mouth 2 (two) times daily.     cetirizine (ZYRTEC) 10 MG tablet Take 10 mg by mouth daily.     gabapentin  (NEURONTIN ) 400 MG capsule Take 2 capsules (800 mg total) by mouth 2 (two) times daily. (Patient taking differently: Take 500 mg by mouth 2 (two) times daily. Two capsules two times daily) 360 capsule 1   mirabegron  ER (MYRBETRIQ ) 50 MG TB24 tablet Take 1 tablet (50 mg total) by mouth daily. 90 tablet 3   nitrofurantoin  (MACRODANTIN ) 50 MG capsule TAKE 1 CAPSULE AT BEDTIME 90 capsule 1   nystatin  ointment (MYCOSTATIN ) Apply 1 Application topically 2 (two) times daily. 30 g 0   pantoprazole  (PROTONIX ) 40 MG tablet TAKE 1 TABLET EVERY DAY (NEED MD APPOINTMENT) 90 tablet 1   tiZANidine  (ZANAFLEX ) 4 MG tablet TAKE 1 TABLET AT BEDTIME (Patient taking differently: Take 4 mg by mouth at bedtime. Take half tablet (2 mg) at bedtime) 90 tablet 3   valACYclovir  (VALTREX ) 1000 MG tablet TAKE 1 TABLET EVERY DAY (NEED MD APPOINTMENT) 90 tablet 1   No current facility-administered medications on file prior to visit.    Allergies:  Allergies  Allergen Reactions   Ciprofloxacin  Swelling  Collagen     Redness, swelling    Vital Signs:  BP 108/73   Pulse 87   Ht 5' 6 (1.676 m)   Wt 209 lb (94.8 kg)   SpO2 96%   BMI 33.73 kg/m    Neurological Exam: MENTAL STATUS including orientation to time, place, person, recent and remote memory, attention span and concentration, language, and fund of knowledge is normal.  Speech is not dysarthric.  CRANIAL NERVES:  No visual field defects.  Pupils equal round and reactive to light.  Normal conjugate, extra-ocular eye  movements in all directions of gaze.  No ptosis.  Face is symmetric.   MOTOR:  Motor strength is 5/5 in all extremities.  No atrophy, fasciculations or abnormal movements.  No pronator drift.  Tone is normal.    MSRs:  Reflexes are 2+/4 throughout.  SENSORY:  Intact to vibration throughout.  COORDINATION/GAIT:  Normal finger-to- nose-finger.  Gait narrow based and stable. Unsteadiness with tandem gait.   Data: NCS/EMG of the legs 10/13/2023:  This is a normal study of the lower extremities.  In particular, there is no evidence of a large fiber sensorimotor polyneuropathy or lumbosacral radiculopathy affecting the lower extremities.   NCS/EMG of the arms 10/07/2023: Left ulnar neuropathy with slowing across the elbow, mild. There is no evidence of carpal tunnel or cervical radiculopathy affecting the upper extremities.   Thank you for allowing me to participate in patient's care.  If I can answer any additional questions, I would be pleased to do so.    Sincerely,    Daliya Parchment K. Tobie, DO

## 2023-10-25 NOTE — Patient Instructions (Addendum)
 Reduce gabapentin  400mg  every 2 weeks by starting with morning dose.  Week 1: Take 1 tablet in the morning and 2 tablets at bedtime x 2 weeks Week 2: Take 1 tablet twice daily x 2 weeks Week 3: Stop morning gabapentin  and take 1 tablet at bedtime x 2 weeks Week 4: Stop

## 2023-11-03 ENCOUNTER — Telehealth: Payer: Self-pay | Admitting: Family Medicine

## 2023-11-03 NOTE — Telephone Encounter (Signed)
 Copied from CRM [id number]. Topic: Referral - Request for Referral >> Nov 02, 2023  4:56 PM Kevelyn M wrote: Did the patient discuss referral with their provider in the last year? Yes (If No - schedule appointment) (If Yes - send message)  Appointment offered? Yes  Type of order/referral and detailed reason for visit: Dermatologist  Preference of office, provider, location: [place]  If referral order, have you been seen by this specialty before? Yes (If Yes, this issue or another issue? When? Where?  Can we respond through MyChart? Yes

## 2023-11-03 NOTE — Telephone Encounter (Signed)
 Spoke with the patient to inquire the reason she requested a referral to a dermatologist.  Patient stated she has a growth under the left breast that has been present for the past 7-8 months.  Appt was scheduled with PCP on 9/9 for evaluation as she has not been seen in the office for this problem.

## 2023-11-08 ENCOUNTER — Ambulatory Visit (INDEPENDENT_AMBULATORY_CARE_PROVIDER_SITE_OTHER): Admitting: Family Medicine

## 2023-11-08 ENCOUNTER — Encounter: Payer: Self-pay | Admitting: Family Medicine

## 2023-11-08 VITALS — BP 130/70 | HR 88 | Temp 96.7°F | Ht 66.0 in | Wt 210.7 lb

## 2023-11-08 DIAGNOSIS — B372 Candidiasis of skin and nail: Secondary | ICD-10-CM

## 2023-11-08 DIAGNOSIS — L820 Inflamed seborrheic keratosis: Secondary | ICD-10-CM

## 2023-11-08 MED ORDER — KETOCONAZOLE 2 % EX CREA
1.0000 | TOPICAL_CREAM | Freq: Two times a day (BID) | CUTANEOUS | 1 refills | Status: AC
Start: 2023-11-08 — End: ?

## 2023-11-08 NOTE — Progress Notes (Signed)
   Acute Office Visit  Subjective:     Patient ID: Jillian Guerrero, female    DOB: 15-Oct-1945, 78 y.o.   MRN: [id number]  Chief Complaint  Patient presents with   Breast Mass    Left breast x7-8 months    HPI Discussed the use of AI scribe software for clinical note transcription with the patient, who gave verbal consent to proceed.  History of Present Illness   Jillian Guerrero is a 78 year old female who presents with a rash and irritation under her breasts.  The rash began approximately six to seven months ago under the left breast and has progressively enlarged, spreading to the opposite side. It varies in color, appearing very red at times and less so at others. The rash is not pruritic but causes discomfort, particularly when not wearing a bra. She has attempted to alleviate the irritation by removing the wires from her bras, but the issue persists. The area is irritating and causes discomfort, which she attributes to the way her breast rests on the skin.  She has a brown lesion on her back that has been present for a couple of years and has not changed in size. She does not recall any prior skin issues before the current rash.       Review of Systems  All other systems reviewed and are negative.       Objective:    BP 130/70   Pulse 88   Temp (!) 96.7 F (35.9 C) (Axillary)   Ht 5' 6 (1.676 m)   Wt 210 lb 11.2 oz (95.6 kg)   SpO2 98%   BMI 34.01 kg/m    Physical Exam Vitals reviewed.  Constitutional:      Appearance: Normal appearance. She is obese.  Pulmonary:     Effort: Pulmonary effort is normal.  Neurological:     Mental Status: She is alert.        No results found for any visits on 11/08/23.      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 - Prescribed ketoconazole  2% cream, apply twice daily for two weeks. - Advised to keep cream available for recurrence. - Reassess condition at next appt in November  Seborrheic keratosis of skin Benign seborrheic keratosis on back and under left breast, stable in size. - Consider dermatology referral if removal needed. - Reassess lesions at next appt in November        Meds ordered this encounter  Medications   ketoconazole  (NIZORAL ) 2 % cream    Sig: Apply 1 Application topically 2 (two) times daily.    Dispense:  60 g    Refill:  1    No follow-ups on file.  Heron CHRISTELLA Sharper, MD

## 2023-12-29 ENCOUNTER — Ambulatory Visit: Admitting: Orthopedic Surgery

## 2023-12-29 ENCOUNTER — Other Ambulatory Visit: Payer: Self-pay

## 2023-12-29 ENCOUNTER — Encounter: Payer: Self-pay | Admitting: Orthopedic Surgery

## 2023-12-29 DIAGNOSIS — M1611 Unilateral primary osteoarthritis, right hip: Secondary | ICD-10-CM

## 2023-12-29 NOTE — Progress Notes (Signed)
 Office Visit Note   Patient: Jillian Guerrero           Date of Birth: September 20, 1945           MRN: [id number] Visit Date: 12/29/2023 Requested by: Ozell Heron HERO, MD [address] [place],  [place] [address] PCP: Ozell Heron HERO, MD  Subjective: Chief Complaint  Patient presents with   Right Hip - Pain    HPI: Jillian Guerrero is a 78 y.o. female who presents to the office reporting right hip pain.  She had an injection done 10/20/2022 into the right hip which has given her 1 year of relief.  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  Describes having both low back pain as well as mild groin pain over the past several months.  No interval history of injury.  Over-the-counter medications to help..                ROS: All systems reviewed are negative as they relate to the chief complaint within the history of present illness.  Patient denies fevers or chills.  Assessment & Plan: Visit Diagnoses:  1. Arthritis of right hip     Plan: Impression is right hip arthritis with surprisingly long relief.  From intra-articular injection performed over a year ago.  Repeat injection performed today.  Continue with strengthening exercises and avoid loadbearing for fitness.  Continue to ambulate with assistive devices as needed.  Follow-up with us  as needed as well.  We could consider hip injections 2/year if they continue to be effective.  Follow-Up Instructions: No follow-ups on file.   Orders:  Orders Placed This Encounter  Procedures   US  Guided Needle Placement - No Linked Charges   No orders of the defined types were placed in this encounter.     Procedures: Large Joint Inj: R hip joint on 12/29/2023 6:10 PM Indications: pain and diagnostic evaluation Details: 22 G 3.5 in needle, ultrasound-guided lateral approach  Arthrogram: No  Medications: 5 mL lidocaine  1 %; 4 mL bupivacaine  0.25 %; 40 mg triamcinolone acetonide 40 MG/ML Outcome: tolerated well, no  immediate complications Procedure, treatment alternatives, risks and benefits explained, specific risks discussed. Consent was given by the patient. Immediately prior to procedure a time out was called to verify the correct patient, procedure, equipment, support staff and site/side marked as required. Patient was prepped and draped in the usual sterile fashion.       Clinical Data: No additional findings.  Objective: Vital Signs: There were no vitals taken for this visit.  Physical Exam:  Constitutional: Patient appears well-developed HEENT:  Head: Normocephalic Eyes:EOM are normal Neck: Normal range of motion Cardiovascular: Normal rate Pulmonary/chest: Effort normal Neurologic: Patient is alert Skin: Skin is warm Psychiatric: Patient has normal mood and affect  Ortho Exam: Ortho exam demonstrates pretty normal gait and alignment.  Surprisingly good range of motion with internal/external rotation of both hips.  Only slight groin pain with maximum internal rotation of the right hip.  Hip flexion abduction adduction strength bilaterally is intact.  Pedal pulses palpable.  Leg lengths equal.  Ankle dorsiflexion intact on the right.  Specialty Comments:  MRI LUMBAR SPINE WITHOUT CONTRAST   TECHNIQUE: Multiplanar, multisequence MR imaging of the lumbar spine was performed. No intravenous contrast was administered.   COMPARISON:  10/20/2020   FINDINGS: Segmentation:  Standard.   Alignment:  Physiologic.   Vertebrae: No acute fracture, evidence of discitis, or aggressive bone lesion. L1 vertebral body compression  fracture unchanged compared with 10/20/2020 with 50% height loss and mild marrow edema along the superior posterior corner of the L1 vertebral body. 4 mm retropulsion of the superior posterior margin of the L1 vertebral body mildly impressing upon the thecal sac and resulting in mild spinal stenosis. Benign hemangioma in the L4 vertebral body.   Conus medullaris  and cauda equina: Conus extends to the L1 level. Conus and cauda equina appear normal.   Paraspinal and other soft tissues: No acute paraspinal abnormality.   Disc levels:   Disc spaces: Disc desiccation throughout the lumbar spine. Mild disc height loss at L3-4.   T11-12: Mild broad-based disc bulge. Moderate right and mild left facet arthropathy. No foraminal or central canal stenosis.   T12-L1: Broad-based disc bulge. Mild bilateral facet arthropathy. No foraminal or central canal stenosis.   L1-L2: Mild broad-based disc bulge. Mild bilateral facet arthropathy. Mild bilateral foraminal stenosis.   L2-L3: Mild broad-based disc bulge. Mild bilateral facet arthropathy. No foraminal or central canal stenosis.   L3-L4: Mild broad-based disc bulge. Moderate bilateral facet arthropathy. Mild spinal stenosis. Bilateral lateral recess stenosis. Mild bilateral foraminal stenosis.   L4-L5: Mild broad-based disc bulge. Mild bilateral facet arthropathy. No foraminal or central canal stenosis.   L5-S1: Mild broad-based disc bulge. Moderate bilateral facet arthropathy. No foraminal or central canal stenosis.   IMPRESSION: 1. L1 vertebral body compression fracture unchanged compared with 10/20/2020 with 50% height loss and mild residual marrow edema along the superior posterior corner of the L1 vertebral body. 4 mm retropulsion of the superior posterior margin of the L1 vertebral body mildly impressing upon the thecal sac and resulting in mild spinal stenosis. 2. Lumbar spine spondylosis as described above.     Electronically Signed   By: Julaine Blanch M.D.   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   Past Medical History:  Diagnosis Date   Asthma    GERD (gastroesophageal reflux disease)    Hyperlipidemia    Lumbar compression fracture (HCC)    Macular degeneration, wet (HCC)    both eyes   Osteopenia    Osteoporosis    Peripheral neuropathy     Family History  Problem Relation Age of Onset   Arthritis Mother    Heart disease Mother    Arthritis Father    Depression Father    Arthritis Brother    High blood pressure Brother    High Cholesterol Brother    Arthritis Maternal Grandfather    Asthma Paternal Grandmother    Depression Paternal Grandmother    Heart disease Paternal Grandmother    High Cholesterol Paternal Grandmother    Arthritis Daughter    Arthritis Son    Heart attack Son    High Cholesterol Son    Colon cancer Neg Hx    Esophageal cancer Neg Hx    Rectal cancer Neg Hx    Bladder Cancer Neg Hx  Uterine cancer Neg Hx     Past Surgical History:  Procedure Laterality Date   APPENDECTOMY  1956   BLADDER SUSPENSION  2003   BREAST BIOPSY  2015   hyaluronic acid viscosupplementation Bilateral    knees - 01/16/2018 x2   IR RADIOLOGIST EVAL & MGMT  01/21/2021   KYPHOPLASTY  10/2018   TONSILLECTOMY  1964   UPPER GI ENDOSCOPY  03/08/2019   gastritis   Social History   Occupational History   Occupation: retired  Tobacco Use   Smoking status: Former    Current packs/day: 1.00    Average packs/day: 1 pack/day for 40.0 years (40.0 ttl pk-yrs)    Types: Cigarettes   Smokeless tobacco: Never   Tobacco comments:    Quit in 2002  Vaping Use   Vaping status: Never Used  Substance and Sexual Activity   Alcohol use: Never   Drug  use: Never   Sexual activity: Not Currently    Birth control/protection: Coitus interruptus

## 2024-01-01 MED ORDER — BUPIVACAINE HCL 0.25 % IJ SOLN
4.0000 mL | INTRAMUSCULAR | Status: AC | PRN
  Administered 2023-12-29: 4 mL via INTRA_ARTICULAR

## 2024-01-01 MED ORDER — TRIAMCINOLONE ACETONIDE 40 MG/ML IJ SUSP
40.0000 mg | INTRAMUSCULAR | Status: AC | PRN
  Administered 2023-12-29: 40 mg via INTRA_ARTICULAR

## 2024-01-01 MED ORDER — LIDOCAINE HCL 1 % IJ SOLN
5.0000 mL | INTRAMUSCULAR | Status: AC | PRN
  Administered 2023-12-29: 5 mL

## 2024-01-02 ENCOUNTER — Encounter: Payer: Self-pay | Admitting: Radiology

## 2024-01-02 ENCOUNTER — Other Ambulatory Visit: Payer: Self-pay | Admitting: Family Medicine

## 2024-01-02 DIAGNOSIS — E782 Mixed hyperlipidemia: Secondary | ICD-10-CM

## 2024-01-04 ENCOUNTER — Ambulatory Visit: Admitting: Family Medicine

## 2024-01-04 VITALS — BP 108/64 | HR 75 | Temp 98.3°F | Ht 66.0 in | Wt 209.1 lb

## 2024-01-04 DIAGNOSIS — M545 Low back pain, unspecified: Secondary | ICD-10-CM | POA: Diagnosis not present

## 2024-01-04 DIAGNOSIS — G8929 Other chronic pain: Secondary | ICD-10-CM | POA: Diagnosis not present

## 2024-01-04 DIAGNOSIS — B372 Candidiasis of skin and nail: Secondary | ICD-10-CM | POA: Diagnosis not present

## 2024-01-04 NOTE — Progress Notes (Signed)
 Established Patient Office Visit  Subjective   Patient ID: Jillian Guerrero, female    DOB: [birth year]  Age: 78 y.o. MRN: [id number]  No chief complaint on file.   HPI Discussed the use of AI scribe software for clinical note transcription with the patient, who gave verbal consent to proceed.  History of Present Illness   Jillian Guerrero is a 78 year old female who presents for a follow-up visit regarding a recurrent rash and management of neuropathic pain.  A recurrent rash appeared yesterday in the skin folds, particularly under her breasts. She attributes it to deodorant use, which she has discontinued. She applies a prescribed cream twice daily, which has slightly improved the rash. She plans to use powder once the rash resolves.  She experiences severe lower back pain, which began a month and a half ago and affects her sleep. She resumed gabapentin  at 600 mg, noting improvement in pain and sleep. She has been seeing a chiropractor and received an injection from her orthopedist, which provided temporary relief.       Current Outpatient Medications  Medication Instructions   alendronate  (FOSAMAX ) 70 MG tablet TAKE 1 TABLET EVERY 7 DAYS - TAKE WITH A FULL GLASS OF WATER ON AN EMPTY STOMACH.   atorvastatin  (LIPITOR) 10 MG tablet TAKE 1 TABLET EVERY DAY (NEED MD APPOINTMENT)   calcium  citrate-vitamin D  (CITRACAL+D) 315-200 MG-UNIT tablet 1 tablet, 2 times daily   cetirizine (ZYRTEC) 10 mg, Daily   ketoconazole  (NIZORAL ) 2 % cream 1 Application, Topical, 2 times daily   mirabegron  ER (MYRBETRIQ ) 50 mg, Oral, Daily   nystatin  ointment (MYCOSTATIN ) 1 Application, Topical, 2 times daily   pantoprazole  (PROTONIX ) 40 MG tablet TAKE 1 TABLET EVERY DAY (NEED MD APPOINTMENT)   tiZANidine  (ZANAFLEX ) 4 mg, Oral, Daily at bedtime   valACYclovir  (VALTREX ) 1000 MG tablet TAKE 1 TABLET EVERY DAY (NEED MD APPOINTMENT)    Patient Active Problem List   Diagnosis Date Noted   Vaginal atrophy  05/23/2023   Herpes zoster 11/10/2021   Chronic rhinitis 01/02/2021   Chronic cough 11/27/2020   Hyperlipidemia 01/20/2020   Asthma 01/20/2020   GERD (gastroesophageal reflux disease) 01/20/2020   Osteoporosis 01/20/2020   Vitamin D  deficiency 01/20/2020   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     Objective:     BP 108/64   Pulse 75   Temp 98.3 F (36.8 C) (Oral)   Ht 5' 6 (1.676 m)   Wt 209 lb 1.6 oz (94.8 kg)   SpO2 98%   BMI 33.75 kg/m    Physical Exam Vitals reviewed.  Constitutional:      Appearance: Normal appearance. She is well-groomed and normal weight.  Cardiovascular:     Rate and Rhythm: Normal rate and regular rhythm.     Heart sounds: S1 normal and S2 normal.  Pulmonary:     Effort: Pulmonary effort is normal.     Breath sounds: Normal breath sounds and air entry.  Musculoskeletal:     Right lower leg: No edema.     Left lower leg: No edema.  Skin:    Findings: Rash (lower abdominal fold rash, no satellite lestions seen) present.  Neurological:     Mental Status: She is alert and oriented to person, place, and time. Mental status is at baseline.     Gait: Gait is intact.  Psychiatric:  Mood and Affect: Mood and affect normal.        Speech: Speech normal.        Behavior: Behavior normal.        Judgment: Judgment normal.      No results found for any visits on 01/04/24.    The 10-year ASCVD risk score (Arnett DK, et al., 2019) is: 18.2%    Assessment & Plan:  Candidal intertrigo  Chronic bilateral low back pain without sciatica   Assessment and Plan    Candidiasis of skin (intertrigo) Recurrent candidiasis in skin folds, likely exacerbated by moisture retention from deodorant use. Current treatment with antifungal cream has shown improvement. - Continue antifungal cream twice daily until rash resolves. - Use powder instead of deodorant to keep  skin dry and prevent recurrence.  Low back pain Chronic low back pain with recent exacerbation. Previous hip injection provided temporary relief but did not address back pain. Gabapentin  was initially discontinued but resumed due to severe pain and sleep disturbance. Gabapentin  provides relief, indicating neuropathic component to pain. - Continue gabapentin  400 mg, with option to increase to 800 mg if needed for pain control.  General Health Maintenance Routine follow-up and monitoring of chronic conditions. - Scheduled follow-up appointment in six months. - Will perform blood work in six months.        Return in about 6 months (around 07/03/2024) for come fasting for blood work.    Heron CHRISTELLA Sharper, MD

## 2024-01-22 ENCOUNTER — Other Ambulatory Visit: Payer: Self-pay | Admitting: Family Medicine

## 2024-01-22 DIAGNOSIS — B029 Zoster without complications: Secondary | ICD-10-CM

## 2024-01-25 ENCOUNTER — Other Ambulatory Visit: Payer: Self-pay | Admitting: Family Medicine

## 2024-01-25 DIAGNOSIS — K219 Gastro-esophageal reflux disease without esophagitis: Secondary | ICD-10-CM

## 2024-01-25 DIAGNOSIS — N390 Urinary tract infection, site not specified: Secondary | ICD-10-CM

## 2024-02-02 ENCOUNTER — Ambulatory Visit: Admitting: Obstetrics and Gynecology

## 2024-02-02 ENCOUNTER — Encounter: Payer: Self-pay | Admitting: Obstetrics and Gynecology

## 2024-02-02 VITALS — BP 122/78 | HR 70

## 2024-02-02 DIAGNOSIS — N3281 Overactive bladder: Secondary | ICD-10-CM | POA: Diagnosis not present

## 2024-02-02 DIAGNOSIS — N3941 Urge incontinence: Secondary | ICD-10-CM

## 2024-02-02 DIAGNOSIS — R350 Frequency of micturition: Secondary | ICD-10-CM

## 2024-02-02 NOTE — Progress Notes (Signed)
 [facility]  PTNS VISIT  CC:  Overactive bladder  78 y.o. with refractory overactive bladder who presents for percutaneous tibial nerve stimulation. The patient presents for PTNS session Maintenance session # 3.  Patient reports she has been having some new back pain and her incontinence has significantly increased. She reports her bowels are still doing well but her OAB leakage has increased significantly.    Procedure: The patient was placed in the sitting position and the left lower extremity was prepped in the usual fashion. The PTNS needle was then inserted at a 60 degree angle, 5 cm cephalad and 2 cm posterior to the medial malleolus. The PTNS unit was then programmed and an optimal response was noted at 12 milliamps. The PTNS stimulation was then performed at this setting for 30 minutes without incident and the patient tolerated the procedure well. The needle was removed and hemostasis was noted.   The pt will return in 1 week for PTNS Maintenance session # 4. All questions were answered.

## 2024-02-07 ENCOUNTER — Telehealth: Payer: Self-pay

## 2024-02-07 NOTE — Telephone Encounter (Signed)
 Copied from CRM #[id number]. Topic: Clinical - Medication Question >> Feb 07, 2024  3:08 PM Robinson H wrote: Reason for CRM: Patient calling to make sure Dr. Ozell adds the gabapentin  (NEURONTIN ) 400 MG capsule back to her current medication list. States that she recently put her back on medication but not showing as a current medication.  Noriah [phone]

## 2024-02-08 ENCOUNTER — Other Ambulatory Visit: Payer: Self-pay | Admitting: Family Medicine

## 2024-02-08 DIAGNOSIS — G609 Hereditary and idiopathic neuropathy, unspecified: Secondary | ICD-10-CM

## 2024-02-08 MED ORDER — GABAPENTIN 400 MG PO CAPS: 400.0000 mg | ORAL_CAPSULE | Freq: Two times a day (BID) | ORAL | Status: AC

## 2024-02-09 ENCOUNTER — Encounter: Payer: Self-pay | Admitting: Adult Health

## 2024-02-09 ENCOUNTER — Ambulatory Visit: Payer: Medicare Other | Admitting: Adult Health

## 2024-02-09 VITALS — BP 120/86 | HR 81 | Temp 97.8°F | Ht 66.0 in | Wt 209.8 lb

## 2024-02-09 DIAGNOSIS — J310 Chronic rhinitis: Secondary | ICD-10-CM | POA: Diagnosis not present

## 2024-02-09 DIAGNOSIS — Z87891 Personal history of nicotine dependence: Secondary | ICD-10-CM | POA: Diagnosis not present

## 2024-02-09 DIAGNOSIS — J4520 Mild intermittent asthma, uncomplicated: Secondary | ICD-10-CM | POA: Diagnosis not present

## 2024-02-09 DIAGNOSIS — K219 Gastro-esophageal reflux disease without esophagitis: Secondary | ICD-10-CM

## 2024-02-09 NOTE — Progress Notes (Unsigned)
 @Patient  ID: Jillian Guerrero, female    DOB: [birth year], 78 y.o.   MRN: [id number]  Chief Complaint  Patient presents with   Medical Management of Chronic Issues    Chronic Bronchitis f/u    Referring provider: Ozell Heron HERO, MD  HPI: 78 yo female former smoker followed for mild COPD with Asthma and allergic rhinitis     TEST/EVENTS : Reviewed 02/09/2024  PFTs January 02, 2021 FEV1 82%, ratio 75, FVC 83%, no significant bronchodilator response, DLCO 99%.   Chest xray 02/23/22 Clear lungs   Discussed the use of AI scribe software for clinical note transcription with the patient, who gave verbal consent to proceed.  History of Present Illness Jillian Guerrero is a 78 year old female who presents for a pulmonary follow-up.  She has a history of well-controlled asthma and has not needed to use her albuterol  inhaler recently. Her last pulmonary function test was three years ago. She remains active, although her physical activity has decreased slightly due to winter, achieving fewer than 10,000 steps daily. She experiences back pain, which makes activities more challenging.  She takes Zyrtec daily for allergies and post-nasal drip, which, along with acid reflux, causes coughing. Her symptoms are well-controlled with this regimen. She has received both the flu shot and the new COVID-19 vaccine.  She has a history of herpes virus and takes Valacyclovir  1000 mg daily to prevent outbreaks, which she previously experienced on her buttocks. The medication has been effective in preventing further outbreaks. She is concerned about developing a sore in her mouth, unsure if it is a canker sore or cold sore. She recalls having a similar sore once in her life and mentions her mother frequently had sores in her mouth. She feels something is developing inside her mouth and is bothersome.  She lives independently in her own home, having moved from [place]  in 2021. She briefly lived with her  daughter during home renovations but felt 'homeless' without her own space. Her daughter now lives nearby, and she values her independence, preferring not to live with any of her four children.   Lives alone, daughter lives in the same community.   Allergies[1]  Immunization History  Administered Date(s) Administered   Fluad Quad(high Dose 65+) 11/10/2021   Hepatitis A 01/02/2014   Hepatitis A, Adult 01/02/2014, 02/11/2014   INFLUENZA, HIGH DOSE SEASONAL PF 11/28/2023   Influenza-Unspecified 02/22/2020, 01/09/2023   Moderna Covid-19 Fall Seasonal Vaccine 8yrs & older 11/28/2023   Moderna Sars-Covid-2 Vaccination 06/12/2019, 07/11/2019, 04/08/2020, 01/19/2021   Pneumococcal Conjugate-13 12/18/2013   Pneumococcal Polysaccharide-23 09/12/2012   Td 11/08/2013   Tdap 01/02/2014   Typhoid Inactivated 01/02/2014   Typhoid Live 01/02/2014   Yellow Fever 01/02/2014   Zoster Recombinant(Shingrix) 12/12/2020, 04/01/2021    Past Medical History:  Diagnosis Date   Asthma    GERD (gastroesophageal reflux disease)    Hyperlipidemia    Lumbar compression fracture (HCC)    Macular degeneration, wet (HCC)    both eyes   Osteopenia    Osteoporosis    Peripheral neuropathy     Tobacco History: Tobacco Use History[2] Counseling given: Not Answered Tobacco comments: Quit in 2002   Outpatient Medications Prior to Visit  Medication Sig Dispense Refill   alendronate  (FOSAMAX ) 70 MG tablet TAKE 1 TABLET EVERY 7 DAYS - TAKE WITH A FULL GLASS OF WATER ON AN EMPTY STOMACH. 12 tablet 3   atorvastatin  (LIPITOR) 10 MG tablet TAKE 1 TABLET EVERY DAY (NEED  MD APPOINTMENT) 90 tablet 3   cetirizine (ZYRTEC) 10 MG tablet Take 10 mg by mouth daily.     gabapentin  (NEURONTIN ) 400 MG capsule Take 1 capsule (400 mg total) by mouth 2 (two) times daily.     mirabegron  ER (MYRBETRIQ ) 50 MG TB24 tablet Take 1 tablet (50 mg total) by mouth daily. 90 tablet 3   pantoprazole  (PROTONIX ) 40 MG tablet TAKE 1  TABLET EVERY DAY (NEED MD APPOINTMENT) 90 tablet 1   tiZANidine  (ZANAFLEX ) 4 MG tablet TAKE 1 TABLET AT BEDTIME (Patient taking differently: Take 4 mg by mouth at bedtime. Take half tablet (2 mg) at bedtime) 90 tablet 3   valACYclovir  (VALTREX ) 1000 MG tablet TAKE 1 TABLET EVERY DAY 90 tablet 3   calcium  citrate-vitamin D  (CITRACAL+D) 315-200 MG-UNIT tablet Take 1 tablet by mouth 2 (two) times daily. (Patient not taking: Reported on 02/09/2024)     ketoconazole  (NIZORAL ) 2 % cream Apply 1 Application topically 2 (two) times daily. (Patient not taking: Reported on 02/09/2024) 60 g 1   nystatin  ointment (MYCOSTATIN ) Apply 1 Application topically 2 (two) times daily. (Patient not taking: Reported on 02/09/2024) 30 g 0   nitrofurantoin  (MACRODANTIN ) 50 MG capsule Take 50 mg by mouth daily. (Patient not taking: Reported on 02/09/2024)     No facility-administered medications prior to visit.     Review of Systems:   Constitutional:   No  weight loss, night sweats,  Fevers, chills, fatigue, or  lassitude.  HEENT:   No headaches,  Difficulty swallowing,  Tooth/dental problems, or  Sore throat,                No sneezing, itching, ear ache, nasal congestion, post nasal drip,   CV:  No chest pain,  Orthopnea, PND, swelling in lower extremities, anasarca, dizziness, palpitations, syncope.   GI  No heartburn, indigestion, abdominal pain, nausea, vomiting, diarrhea, change in bowel habits, loss of appetite, bloody stools.   Resp: No shortness of breath with exertion or at rest.  No excess mucus, no productive cough,  No non-productive cough,  No coughing up of blood.  No change in color of mucus.  No wheezing.  No chest wall deformity  Skin: no rash or lesions.  GU: no dysuria, change in color of urine, no urgency or frequency.  No flank pain, no hematuria   MS:  No joint pain or swelling.  No decreased range of motion.  No back pain.    Physical Exam  There were no vitals taken for this  visit.  GEN: A/Ox3; pleasant , NAD, well nourished    HEENT:  Flasher/AT,  EACs-clear, TMs-wnl, NOSE-clear, THROAT-clear, no lesions, no postnasal drip or exudate noted.   NECK:  Supple w/ fair ROM; no JVD; normal carotid impulses w/o bruits; no thyromegaly or nodules palpated; no lymphadenopathy.    RESP  Clear  P & A; w/o, wheezes/ rales/ or rhonchi. no accessory muscle use, no dullness to percussion  CARD:  RRR, no m/r/g, no peripheral edema, pulses intact, no cyanosis or clubbing.  GI:   Soft & nt; nml bowel sounds; no organomegaly or masses detected.   Musco: Warm bil, no deformities or joint swelling noted.   Neuro: alert, no focal deficits noted.    Skin: Warm, no lesions or rashes    Lab Results:Reviewed 02/09/2024   CBC    Component Value Date/Time   WBC 4.1 06/07/2023 0911   RBC 4.45 06/07/2023 0911   HGB 14.0 06/07/2023 0911  HCT 41.2 06/07/2023 0911   PLT 193.0 06/07/2023 0911   MCV 92.6 06/07/2023 0911   MCHC 34.0 06/07/2023 0911   RDW 13.6 06/07/2023 0911   LYMPHSABS 0.9 05/04/2022 1334   MONOABS 0.6 05/04/2022 1334   EOSABS 0.0 05/04/2022 1334   BASOSABS 0.0 05/04/2022 1334    BMET    Component Value Date/Time   NA 143 06/07/2023 0911   NA 140 09/17/2019 0000   K 3.7 06/07/2023 0911   CL 107 06/07/2023 0911   CO2 26 06/07/2023 0911   GLUCOSE 98 06/07/2023 0911   BUN 17 06/07/2023 0911   BUN 23 (A) 09/17/2019 0000   CREATININE 0.75 06/07/2023 0911   CALCIUM  9.1 06/07/2023 0911   GFRNONAA 80 09/29/2017 1255    BNP No results found for: BNP  ProBNP No results found for: PROBNP  Imaging: No results found.  bupivacaine  (MARCAINE ) 0.25 % (with pres) injection 4 mL     Date Action Dose Route User   12/29/2023 1810 Given 4 mL Intra-articular (Right Hip) Addie Cordella Hamilton, MD      lidocaine  (XYLOCAINE ) 1 % (with pres) injection 5 mL     Date Action Dose Route User   12/29/2023 1810 Given 5 mL Other (Right Hip) Addie Cordella Hamilton, MD       triamcinolone  acetonide (KENALOG -40) injection 40 mg     Date Action Dose Route User   12/29/2023 1810 Given 40 mg Intra-articular (Right Hip) Addie Cordella Hamilton, MD          Latest Ref Rng & Units 01/02/2021   10:58 AM  PFT Results  FVC-Pre L 2.57   FVC-Predicted Pre % 81   FVC-Post L 2.62   FVC-Predicted Post % 83   Pre FEV1/FVC % % 72   Post FEV1/FCV % % 75   FEV1-Pre L 1.85   FEV1-Predicted Pre % 78   FEV1-Post L 1.95   DLCO uncorrected ml/min/mmHg 21.03   DLCO UNC% % 99   DLCO corrected ml/min/mmHg 21.03   DLCO COR %Predicted % 99   DLVA Predicted % 121   TLC L 5.68   TLC % Predicted % 103   RV % Predicted % 130     No results found for: NITRICOXIDE      No data to display              Assessment & Plan:   Assessment and Plan Assessment & Plan Mild intermittent asthma   Asthma is well-controlled with no recent exacerbations or need for albuterol . Continue current asthma management plan.  Chronic rhinitis   Managed with daily Zyrtec for post-nasal drip and allergy symptoms. Continue daily Zyrtec for allergy symptoms.  Gastroesophageal reflux disease   GERD symptoms are well-managed with current medication regimen. Continue current GERD management plan.        Chasitee Zenker, NP 02/09/2024  I spent   minutes dedicated to the care of this patient on the date of this encounter to include pre-visit review of records, face-to-face time with the patient discussing conditions above, post visit ordering of testing, clinical documentation with the electronic health record, making appropriate referrals as documented, and communicating necessary findings to members of the patients care team.      [1]  Allergies Allergen Reactions   Ciprofloxacin  Swelling   Collagen     Redness, swelling  [2]  Social History Tobacco Use  Smoking Status Former   Current packs/day: 1.00   Average packs/day: 1 pack/day for 40.0  years (40.0 ttl pk-yrs)    Types: Cigarettes  Smokeless Tobacco Never  Tobacco Comments   Quit in 2002

## 2024-02-09 NOTE — Patient Instructions (Addendum)
 Delsym 2 tsp Twice daily  As needed  cough Tessalon  Three times a day  as needed cough  Saline nasal rinses As needed   Zyrtec daily  Continue on Protonix  daily  GERD diet  Albuterol  inhaler or neb As needed   Oral gel As needed  for mouth irritation if needed.  Follow up with Dr. Shelah or Yousuf Ager NP in 1 year and As needed

## 2024-03-15 ENCOUNTER — Ambulatory Visit: Admitting: Obstetrics and Gynecology

## 2024-03-15 ENCOUNTER — Encounter: Payer: Self-pay | Admitting: Obstetrics and Gynecology

## 2024-03-15 VITALS — BP 114/74 | HR 80

## 2024-03-15 DIAGNOSIS — R8290 Unspecified abnormal findings in urine: Secondary | ICD-10-CM

## 2024-03-15 DIAGNOSIS — N3941 Urge incontinence: Secondary | ICD-10-CM

## 2024-03-15 DIAGNOSIS — N3281 Overactive bladder: Secondary | ICD-10-CM | POA: Diagnosis not present

## 2024-03-15 DIAGNOSIS — R350 Frequency of micturition: Secondary | ICD-10-CM

## 2024-03-15 LAB — POCT URINALYSIS DIP (CLINITEK)
Bilirubin, UA: NEGATIVE
Blood, UA: NEGATIVE
Glucose, UA: NEGATIVE mg/dL
Ketones, POC UA: NEGATIVE mg/dL
Leukocytes, UA: NEGATIVE
Nitrite, UA: NEGATIVE
POC PROTEIN,UA: NEGATIVE
Spec Grav, UA: >=1.03 — AB
Urobilinogen, UA: 0.2 U/dL
pH, UA: 5

## 2024-03-15 MED ORDER — MIRABEGRON ER 50 MG PO TB24: 50.0000 mg | ORAL_TABLET | Freq: Every day | ORAL | 3 refills | Status: AC

## 2024-03-15 NOTE — Progress Notes (Signed)
 [facility]  PTNS VISIT  CC:  Overactive bladder  79 y.o. with refractory overactive bladder who presents for percutaneous tibial nerve stimulation. The patient presents for PTNS  maintenance session # 4.  Patient also reports an odor to her urine. Will check sample today.    Procedure: The patient was placed in the sitting position and the left lower extremity was prepped in the usual fashion. The PTNS needle was then inserted at a 60 degree angle, 5 cm cephalad and 2 cm posterior to the medial malleolus. The PTNS unit was then programmed and an optimal response was noted at 8 milliamps. The PTNS stimulation was then performed at this setting for 30 minutes without incident and the patient tolerated the procedure well. The needle was removed and hemostasis was noted.    Lab Results  Component Value Date   COLORU yellow 03/15/2024   CLARITYU clear 03/15/2024   GLUCOSEUR negative 03/15/2024   BILIRUBINUR negative 03/15/2024   KETONESU negative 06/02/2023   SPECGRAV >=1.030 (A) 03/15/2024   RBCUR negative 03/15/2024   PHUR 5.0 03/15/2024   PROTEINUR NEGATIVE 06/02/2023   UROBILINOGEN 0.2 03/15/2024   LEUKOCYTESUR Negative 03/15/2024   Patient can use boric acid suppositories to support pH balance vaginally. No sign of urine infection.    The pt will return in 1 week for PTNS maintenance session # 5. All questions were answered.

## 2024-03-22 ENCOUNTER — Ambulatory Visit: Admitting: Orthopedic Surgery

## 2024-03-22 ENCOUNTER — Other Ambulatory Visit: Payer: Self-pay

## 2024-03-22 ENCOUNTER — Ambulatory Visit

## 2024-03-22 DIAGNOSIS — M25512 Pain in left shoulder: Secondary | ICD-10-CM

## 2024-03-22 DIAGNOSIS — M545 Low back pain, unspecified: Secondary | ICD-10-CM

## 2024-03-22 MED ORDER — METHYLPREDNISOLONE 4 MG PO TBPK: ORAL_TABLET | ORAL | 0 refills | Status: AC

## 2024-03-22 MED ORDER — NAPROXEN 500 MG PO TABS: 500.0000 mg | ORAL_TABLET | Freq: Two times a day (BID) | ORAL | 0 refills | Status: AC

## 2024-03-22 NOTE — Progress Notes (Signed)
 Orthopedic Spine Surgery Office Note  Assessment: Patient is a 79 y.o. female with low back pain, right hip pain, left shoulder pain   Plan: -Patient initially had pain near her thoracolumbar junction but now she is having pain her lower lumbar spine. I recommended a SPECT scan to evaluate further. She never ended up seeing Dr. Patience about corrective surgery. She has learned to manage her thoracolumbar back pain -In regards to her left shoulder, it has been present for 10 days. I expect it will get better. I prescribed a medrol  dose pak for that pain. She should not take any NSAIDs when taking this medication. Her pain is in the area of the bicipital groove and seems consistent with biceps tendonitis -She did not respond to a right hip injection so I do not think this is arthritic type pain. She points to an area of the trochanteric bursa which could be the cause. This is her least painful area right now so we will continue to monitor for now -Patient should return to office in 4 weeks, x-rays at next visit: none   Patient expressed understanding of the plan and all questions were answered to the patient's satisfaction.   ___________________________________________________________________________   History:  Patient is a 79 y.o. female who presents today for lumbar spine and left shoulder pain. Patient has a long history of back pain. I had previously sent her to another surgeon for consideration of deformity correction but she could not get in to that office. Her pain was in the thoracolumbar spine which she still has but she has learned to cope with that pain. Her new pain is in the lower lumbar spine. She has no radicular symptoms. She has pain over the right lateral hip when laying on her side. She got an injection into her hip but that did not provide her with any relief. She has noticed pain in the left shoulder for about 10 days which has made it hard to lay on that side. There was no  trauma or injury that preceded the onset of any of these pains. No bowel or bladder incontinence. No saddle anesthesia.   Treatments tried: tylenol , naproxen , aleve , lumbar steroid injection  Review of systems: Denies fevers and chills, night sweats, unexplained weight loss, history of cancer, pain that wakes them at night  Past medical history: Asthma GERD Osteoporosis (on alendronate )  Macular degeneration Neuropathy HLD  Allergies: ciprofloxacin   Past surgical history:  Tonsillectomy Appendectomy Bladder suspension Kyphoplasty  Social history: Denies use of nicotine product (smoking, vaping, patches, smokeless) Alcohol use: denies Denies recreational drug use   Physical Exam:  General: no acute distress, appears stated age Neurologic: alert, answering questions appropriately, following commands Respiratory: unlabored breathing on room air, symmetric chest rise Psychiatric: appropriate affect, normal cadence to speech   MSK (spine):  -Strength exam      Left  Right EHL    5/5  5/5 TA    5/5  5/5 GSC    5/5  5/5 Knee extension  5/5  5/5 Hip flexion   5/5  5/5  -Sensory exam    Sensation intact to light touch in L3-S1 nerve distributions of bilateral lower extremities  -Straight leg raise: negative bilaterally -Clonus: no beats bilaterally  -Left hip exam: no pain through range of motion -Right hip exam: TTP over the lateral aspect of the hip in the area of the bursa, no pain through range of motion  -Left shoulder exam: TTP over the bicipital groove, no  other tenderness to palpation over the shoulder, positive speeds, negative yergasons, negative jobe, no weakness with external rotation with arm at side, negative belly press   Imaging: XRs of the lumbar spine from 03/22/2024 were independently reviewed and interpreted, showing cement augmentation within the L1 vertebra. There is a chronic appearing compression fracture at L1. Disc height loss with small  anterior osteophyte formation at L3/4. Joint space narrowing and subchondral sclerosis in the right hip. No evidence of instability on flexion/extension views. No new fracture seen. No dislocation seen.   MRI of the lumbar spine from 03/07/2022 was independently reviewed and interpreted, showing hypodensity within the L1 vertebra. Compression fracture at L1. Mild lateral recess stenosis at L5/S1.   XRs of the left shoulder from 03/22/2024 were independently reviewed and interpreted, showing joint space narrowing and osteophyte formation at the [facility] joint. No fracture or dislocation seen. No significant degenerative changes within the glenohumeral joint.    Patient name: Jillian Guerrero Patient MRN: [id number] Date of visit: 03/22/24

## 2024-03-23 ENCOUNTER — Ambulatory Visit: Admitting: Obstetrics and Gynecology

## 2024-03-23 VITALS — BP 116/87 | HR 91

## 2024-03-23 DIAGNOSIS — N3281 Overactive bladder: Secondary | ICD-10-CM

## 2024-03-23 NOTE — Progress Notes (Signed)
 [facility]  PTNS VISIT  CC:  Overactive bladder  79 y.o. with refractory overactive bladder who presents for percutaneous tibial nerve stimulation. The patient presents for PTNS  maintenance session # 5.  Patient also reports an odor to her urine. Will check sample today.    Procedure: The patient was placed in the sitting position and the left lower extremity was prepped in the usual fashion. The PTNS needle was then inserted at a 60 degree angle, 5 cm cephalad and 2 cm posterior to the medial malleolus. The PTNS unit was then programmed and an optimal response was noted at 6 milliamps. The PTNS stimulation was then performed at this setting for 30 minutes without incident and the patient tolerated the procedure well. The needle was removed and hemostasis was noted.    Lab Results  Component Value Date   COLORU yellow 03/15/2024   CLARITYU clear 03/15/2024   GLUCOSEUR negative 03/15/2024   BILIRUBINUR negative 03/15/2024   KETONESU negative 06/02/2023   SPECGRAV >=1.030 (A) 03/15/2024   RBCUR negative 03/15/2024   PHUR 5.0 03/15/2024   PROTEINUR NEGATIVE 06/02/2023   UROBILINOGEN 0.2 03/15/2024   LEUKOCYTESUR Negative 03/15/2024    The pt will return in 1 week for PTNS maintenance session # 6. All questions were answered.

## 2024-03-29 ENCOUNTER — Encounter: Payer: Self-pay | Admitting: Obstetrics and Gynecology

## 2024-03-29 ENCOUNTER — Ambulatory Visit: Admitting: Obstetrics and Gynecology

## 2024-03-29 VITALS — BP 112/77 | HR 85

## 2024-03-29 DIAGNOSIS — N3281 Overactive bladder: Secondary | ICD-10-CM | POA: Diagnosis not present

## 2024-03-29 NOTE — Progress Notes (Signed)
 [facility]  PTNS VISIT  CC:  Overactive bladder  79 y.o. with refractory overactive bladder who presents for percutaneous tibial nerve stimulation. The patient presents for PTNS  maintenance session # 6.   Procedure: The patient was placed in the sitting position and the left lower extremity was prepped in the usual fashion. The PTNS needle was then inserted at a 60 degree angle, 5 cm cephalad and 2 cm posterior to the medial malleolus. The PTNS unit was then programmed and an optimal response was noted at 9 milliamps. The PTNS stimulation was then performed at this setting for 30 minutes without incident and the patient tolerated the procedure well. The needle was removed and hemostasis was noted.    The pt will return in 1 week for PTNS maintenance session # 7. Will have her fill out a bladder diary before next session to ensure improvement from baseline.  All questions were answered.  Rosaline LOISE Caper, MD

## 2024-04-03 ENCOUNTER — Ambulatory Visit

## 2024-04-05 ENCOUNTER — Other Ambulatory Visit: Payer: Self-pay

## 2024-04-05 ENCOUNTER — Ambulatory Visit

## 2024-04-05 ENCOUNTER — Ambulatory Visit: Admitting: Orthopedic Surgery

## 2024-04-05 VITALS — Ht 66.5 in | Wt 208.6 lb

## 2024-04-05 DIAGNOSIS — M1611 Unilateral primary osteoarthritis, right hip: Secondary | ICD-10-CM

## 2024-04-10 ENCOUNTER — Other Ambulatory Visit

## 2024-04-11 ENCOUNTER — Ambulatory Visit

## 2024-04-18 ENCOUNTER — Ambulatory Visit

## 2024-05-08 ENCOUNTER — Other Ambulatory Visit

## 2024-05-10 ENCOUNTER — Ambulatory Visit: Admitting: Orthopedic Surgery

## 2024-05-16 ENCOUNTER — Ambulatory Visit: Admitting: Orthopedic Surgery

## 2024-07-03 ENCOUNTER — Ambulatory Visit: Admitting: Family Medicine

## 2024-10-29 ENCOUNTER — Ambulatory Visit
